# Patient Record
Sex: Male | Born: 1935 | Race: Black or African American | Hispanic: No | Smoking: Former smoker
Health system: Southern US, Community
[De-identification: ages and names within clinical notes are randomized; demographics above are authoritative.]

## PROBLEM LIST (undated history)

## (undated) DIAGNOSIS — N183 Chronic kidney disease, stage 3 unspecified: Secondary | ICD-10-CM

## (undated) DIAGNOSIS — E119 Type 2 diabetes mellitus without complications: Secondary | ICD-10-CM

## (undated) DIAGNOSIS — I5022 Chronic systolic (congestive) heart failure: Secondary | ICD-10-CM

## (undated) DIAGNOSIS — E7800 Pure hypercholesterolemia, unspecified: Secondary | ICD-10-CM

## (undated) DIAGNOSIS — I639 Cerebral infarction, unspecified: Secondary | ICD-10-CM

## (undated) DIAGNOSIS — I10 Essential (primary) hypertension: Secondary | ICD-10-CM

## (undated) DIAGNOSIS — R609 Edema, unspecified: Secondary | ICD-10-CM

## (undated) DIAGNOSIS — I219 Acute myocardial infarction, unspecified: Secondary | ICD-10-CM

## (undated) HISTORY — DX: Essential (primary) hypertension: I10

## (undated) HISTORY — DX: Edema, unspecified: R60.9

## (undated) HISTORY — DX: Acute myocardial infarction, unspecified: I21.9

## (undated) HISTORY — DX: Pure hypercholesterolemia, unspecified: E78.00

## (undated) HISTORY — PX: EYE SURGERY: SHX253

## (undated) HISTORY — DX: Type 2 diabetes mellitus without complications: E11.9

## (undated) HISTORY — PX: EXPLORATION POST OPERATIVE OPEN HEART: SHX5061

---

## 2006-04-17 ENCOUNTER — Ambulatory Visit: Payer: Self-pay | Admitting: Family Medicine

## 2006-04-22 ENCOUNTER — Inpatient Hospital Stay: Payer: Self-pay | Admitting: Vascular Surgery

## 2006-04-23 ENCOUNTER — Other Ambulatory Visit: Payer: Self-pay

## 2006-05-01 ENCOUNTER — Ambulatory Visit: Payer: Self-pay | Admitting: Vascular Surgery

## 2006-05-13 ENCOUNTER — Inpatient Hospital Stay: Payer: Self-pay | Admitting: Vascular Surgery

## 2007-10-20 ENCOUNTER — Ambulatory Visit: Payer: Self-pay | Admitting: Gastroenterology

## 2007-11-12 ENCOUNTER — Inpatient Hospital Stay: Payer: Self-pay | Admitting: Internal Medicine

## 2007-11-20 ENCOUNTER — Ambulatory Visit: Payer: Self-pay | Admitting: Gastroenterology

## 2007-11-26 ENCOUNTER — Ambulatory Visit: Payer: Self-pay | Admitting: Gastroenterology

## 2008-02-11 ENCOUNTER — Inpatient Hospital Stay: Payer: Self-pay | Admitting: Gastroenterology

## 2008-02-27 ENCOUNTER — Ambulatory Visit: Payer: Self-pay | Admitting: Gastroenterology

## 2008-04-20 ENCOUNTER — Ambulatory Visit: Payer: Self-pay | Admitting: Family Medicine

## 2008-04-29 ENCOUNTER — Ambulatory Visit: Payer: Self-pay | Admitting: Family Medicine

## 2008-05-29 ENCOUNTER — Ambulatory Visit: Payer: Self-pay | Admitting: Family Medicine

## 2008-06-29 ENCOUNTER — Ambulatory Visit: Payer: Self-pay | Admitting: Family Medicine

## 2008-10-20 ENCOUNTER — Ambulatory Visit: Payer: Self-pay | Admitting: Internal Medicine

## 2008-12-28 ENCOUNTER — Inpatient Hospital Stay: Payer: Self-pay | Admitting: Internal Medicine

## 2009-05-01 ENCOUNTER — Emergency Department: Payer: Self-pay | Admitting: Internal Medicine

## 2009-06-03 ENCOUNTER — Inpatient Hospital Stay: Payer: Self-pay | Admitting: Internal Medicine

## 2009-07-21 ENCOUNTER — Encounter: Payer: Self-pay | Admitting: Internal Medicine

## 2009-07-29 ENCOUNTER — Encounter: Payer: Self-pay | Admitting: Internal Medicine

## 2009-08-24 ENCOUNTER — Ambulatory Visit: Payer: Self-pay | Admitting: Internal Medicine

## 2009-08-29 ENCOUNTER — Encounter: Payer: Self-pay | Admitting: Internal Medicine

## 2009-09-29 ENCOUNTER — Encounter: Payer: Self-pay | Admitting: Internal Medicine

## 2009-11-05 ENCOUNTER — Inpatient Hospital Stay: Payer: Self-pay | Admitting: Internal Medicine

## 2009-12-05 ENCOUNTER — Ambulatory Visit: Payer: Self-pay | Admitting: Gastroenterology

## 2010-09-27 ENCOUNTER — Ambulatory Visit: Payer: Self-pay | Admitting: Internal Medicine

## 2011-01-30 ENCOUNTER — Ambulatory Visit: Payer: Self-pay | Admitting: Internal Medicine

## 2011-02-20 LAB — COMPREHENSIVE METABOLIC PANEL
Anion Gap: 6 — ABNORMAL LOW (ref 7–16)
Bilirubin,Total: 0.5 mg/dL (ref 0.2–1.0)
Chloride: 104 mmol/L (ref 98–107)
Co2: 31 mmol/L (ref 21–32)
Creatinine: 2.49 mg/dL — ABNORMAL HIGH (ref 0.60–1.30)
EGFR (African American): 33 — ABNORMAL LOW
EGFR (Non-African Amer.): 27 — ABNORMAL LOW
Potassium: 4.5 mmol/L (ref 3.5–5.1)
SGOT(AST): 23 U/L (ref 15–37)
SGPT (ALT): 25 U/L
Total Protein: 8.1 g/dL (ref 6.4–8.2)

## 2011-02-20 LAB — CBC
HCT: 33.6 % — ABNORMAL LOW (ref 40.0–52.0)
HGB: 10.6 g/dL — ABNORMAL LOW (ref 13.0–18.0)
MCHC: 31.6 g/dL — ABNORMAL LOW (ref 32.0–36.0)
MCV: 85 fL (ref 80–100)
RDW: 15.7 % — ABNORMAL HIGH (ref 11.5–14.5)
WBC: 11.6 10*3/uL — ABNORMAL HIGH (ref 3.8–10.6)

## 2011-02-20 LAB — CK TOTAL AND CKMB (NOT AT ARMC)
CK, Total: 134 U/L (ref 35–232)
CK-MB: 1.2 ng/mL (ref 0.5–3.6)

## 2011-02-20 LAB — TROPONIN I: Troponin-I: 0.04 ng/mL

## 2011-02-21 ENCOUNTER — Inpatient Hospital Stay: Payer: Self-pay | Admitting: Student

## 2011-02-21 LAB — CK TOTAL AND CKMB (NOT AT ARMC)
CK, Total: 113 U/L (ref 35–232)
CK-MB: 3.4 ng/mL (ref 0.5–3.6)

## 2011-02-21 LAB — BASIC METABOLIC PANEL
Anion Gap: 10 (ref 7–16)
BUN: 53 mg/dL — ABNORMAL HIGH (ref 7–18)
Calcium, Total: 8.8 mg/dL (ref 8.5–10.1)
Chloride: 103 mmol/L (ref 98–107)
Creatinine: 2.55 mg/dL — ABNORMAL HIGH (ref 0.60–1.30)
EGFR (African American): 32 — ABNORMAL LOW
EGFR (Non-African Amer.): 26 — ABNORMAL LOW
Glucose: 192 mg/dL — ABNORMAL HIGH (ref 65–99)
Potassium: 4.2 mmol/L (ref 3.5–5.1)

## 2011-02-21 LAB — FERRITIN: Ferritin (ARMC): 69 ng/mL

## 2011-02-21 LAB — TROPONIN I
Troponin-I: 0.14 ng/mL — ABNORMAL HIGH
Troponin-I: 0.41 ng/mL — ABNORMAL HIGH

## 2011-02-21 LAB — IRON AND TIBC
Iron Bind.Cap.(Total): 268 ug/dL
Iron Saturation: 10 %
Iron: 26 ug/dL — ABNORMAL LOW
Unbound Iron-Bind.Cap.: 242 ug/dL

## 2011-02-21 LAB — URINALYSIS, COMPLETE
Bilirubin,UR: NEGATIVE
Blood: NEGATIVE
Hyaline Cast: 17
Ketone: NEGATIVE
Leukocyte Esterase: NEGATIVE
Ph: 5 (ref 4.5–8.0)
Protein: NEGATIVE
RBC,UR: NONE SEEN /HPF (ref 0–5)
Specific Gravity: 1.006 (ref 1.003–1.030)
Squamous Epithelial: NONE SEEN

## 2011-02-21 LAB — PHOSPHORUS: Phosphorus: 3.9 mg/dL

## 2011-02-21 LAB — RAPID INFLUENZA A&B ANTIGENS

## 2011-02-22 LAB — BASIC METABOLIC PANEL
Calcium, Total: 8.6 mg/dL (ref 8.5–10.1)
Co2: 28 mmol/L (ref 21–32)
EGFR (African American): 33 — ABNORMAL LOW
EGFR (Non-African Amer.): 28 — ABNORMAL LOW
Glucose: 192 mg/dL — ABNORMAL HIGH (ref 65–99)
Osmolality: 304 (ref 275–301)
Potassium: 3.9 mmol/L (ref 3.5–5.1)
Sodium: 143 mmol/L (ref 136–145)

## 2011-02-22 LAB — TROPONIN I
Troponin-I: 0.68 ng/mL — ABNORMAL HIGH
Troponin-I: 0.83 ng/mL — ABNORMAL HIGH

## 2011-02-22 LAB — CBC WITH DIFFERENTIAL/PLATELET
Eosinophil %: 0 %
HCT: 28.2 % — ABNORMAL LOW (ref 40.0–52.0)
Lymphocyte #: 0.8 10*3/uL — ABNORMAL LOW (ref 1.0–3.6)
Lymphocyte %: 5.2 %
MCV: 84 fL (ref 80–100)
Monocyte %: 9 %
Neutrophil #: 12.5 10*3/uL — ABNORMAL HIGH (ref 1.4–6.5)
Neutrophil %: 85.6 %
Platelet: 127 10*3/uL — ABNORMAL LOW (ref 150–440)
RDW: 15.3 % — ABNORMAL HIGH (ref 11.5–14.5)
WBC: 14.6 10*3/uL — ABNORMAL HIGH (ref 3.8–10.6)

## 2011-02-22 LAB — PROTEIN / CREATININE RATIO, URINE
Creatinine, Urine: 49.3 mg/dL (ref 30.0–125.0)
Protein, Random Urine: 11 mg/dL (ref 0–12)
Protein/Creat. Ratio: 223 mg/gCREAT — ABNORMAL HIGH (ref 0–200)

## 2011-02-22 LAB — PROTIME-INR
INR: 1.3
Prothrombin Time: 16.6 secs — ABNORMAL HIGH (ref 11.5–14.7)

## 2011-02-22 LAB — T4, FREE: Free Thyroxine: 1.48 ng/dL — ABNORMAL HIGH (ref 0.76–1.46)

## 2011-02-22 LAB — MAGNESIUM: Magnesium: 1.7 mg/dL — ABNORMAL LOW

## 2011-02-22 LAB — LIPID PANEL
Cholesterol: 86 mg/dL (ref 0–200)
Ldl Cholesterol, Calc: 26 mg/dL (ref 0–100)
Triglycerides: 75 mg/dL (ref 0–200)

## 2011-02-22 LAB — PROTEIN ELECTROPHORESIS(ARMC)

## 2011-02-23 LAB — TROPONIN I
Troponin-I: 0.73 ng/mL — ABNORMAL HIGH
Troponin-I: 0.67 ng/mL — ABNORMAL HIGH

## 2011-02-23 LAB — CBC WITH DIFFERENTIAL/PLATELET
Basophil #: 0 10*3/uL (ref 0.0–0.1)
Basophil %: 0.4 %
Eosinophil #: 0.1 10*3/uL (ref 0.0–0.7)
Eosinophil %: 0.9 %
HCT: 27.5 % — ABNORMAL LOW (ref 40.0–52.0)
HGB: 8.9 g/dL — ABNORMAL LOW (ref 13.0–18.0)
Lymphocyte #: 0.8 10*3/uL — ABNORMAL LOW (ref 1.0–3.6)
Lymphocyte %: 7.1 %
MCH: 27.4 pg (ref 26.0–34.0)
MCHC: 32.5 g/dL (ref 32.0–36.0)
Monocyte #: 1.2 10*3/uL — ABNORMAL HIGH (ref 0.0–0.7)
Neutrophil #: 9.7 10*3/uL — ABNORMAL HIGH (ref 1.4–6.5)
Neutrophil %: 81.8 %
RBC: 3.26 10*6/uL — ABNORMAL LOW (ref 4.40–5.90)
WBC: 11.8 10*3/uL — ABNORMAL HIGH (ref 3.8–10.6)

## 2011-02-23 LAB — BASIC METABOLIC PANEL
Anion Gap: 12 (ref 7–16)
Chloride: 102 mmol/L (ref 98–107)
Co2: 30 mmol/L (ref 21–32)
Creatinine: 2.13 mg/dL — ABNORMAL HIGH (ref 0.60–1.30)
Potassium: 3.9 mmol/L (ref 3.5–5.1)
Sodium: 144 mmol/L (ref 136–145)

## 2011-02-24 LAB — CBC WITH DIFFERENTIAL/PLATELET
Basophil #: 0 10*3/uL (ref 0.0–0.1)
Basophil %: 0.1 %
Eosinophil #: 0.1 10*3/uL (ref 0.0–0.7)
HCT: 26.1 % — ABNORMAL LOW (ref 40.0–52.0)
Lymphocyte %: 6.2 %
MCV: 84 fL (ref 80–100)
Monocyte %: 11.6 %
Neutrophil #: 8.2 10*3/uL — ABNORMAL HIGH (ref 1.4–6.5)
Neutrophil %: 81.5 %
Platelet: 121 10*3/uL — ABNORMAL LOW (ref 150–440)
RBC: 3.11 10*6/uL — ABNORMAL LOW (ref 4.40–5.90)
RDW: 15.3 % — ABNORMAL HIGH (ref 11.5–14.5)
WBC: 10.1 10*3/uL (ref 3.8–10.6)

## 2011-02-24 LAB — BASIC METABOLIC PANEL
Anion Gap: 10 (ref 7–16)
Chloride: 103 mmol/L (ref 98–107)
Creatinine: 2.11 mg/dL — ABNORMAL HIGH (ref 0.60–1.30)
EGFR (African American): 39 — ABNORMAL LOW
Osmolality: 307 (ref 275–301)
Potassium: 4.1 mmol/L (ref 3.5–5.1)
Sodium: 144 mmol/L (ref 136–145)

## 2011-03-02 ENCOUNTER — Ambulatory Visit: Payer: Self-pay | Admitting: Internal Medicine

## 2011-08-15 ENCOUNTER — Ambulatory Visit: Payer: Self-pay | Admitting: Ophthalmology

## 2011-11-14 ENCOUNTER — Ambulatory Visit: Payer: Self-pay | Admitting: Ophthalmology

## 2012-06-27 ENCOUNTER — Inpatient Hospital Stay: Payer: Self-pay | Admitting: Internal Medicine

## 2012-06-27 LAB — URINALYSIS, COMPLETE
Bacteria: NONE SEEN
Bilirubin,UR: NEGATIVE
Glucose,UR: 50 mg/dL (ref 0–75)
Leukocyte Esterase: NEGATIVE
Nitrite: NEGATIVE
Ph: 6 (ref 4.5–8.0)
Protein: NEGATIVE

## 2012-06-27 LAB — COMPREHENSIVE METABOLIC PANEL
Albumin: 3.5 g/dL (ref 3.4–5.0)
Anion Gap: 9 (ref 7–16)
BUN: 21 mg/dL — ABNORMAL HIGH (ref 7–18)
Co2: 24 mmol/L (ref 21–32)
Creatinine: 1.37 mg/dL — ABNORMAL HIGH (ref 0.60–1.30)
EGFR (African American): 57 — ABNORMAL LOW
EGFR (Non-African Amer.): 49 — ABNORMAL LOW
Glucose: 129 mg/dL — ABNORMAL HIGH (ref 65–99)
Potassium: 4.1 mmol/L (ref 3.5–5.1)
SGPT (ALT): 23 U/L (ref 12–78)
Total Protein: 8.1 g/dL (ref 6.4–8.2)

## 2012-06-27 LAB — CBC
HCT: 34.2 % — ABNORMAL LOW (ref 40.0–52.0)
HGB: 11 g/dL — ABNORMAL LOW (ref 13.0–18.0)
MCH: 26.6 pg (ref 26.0–34.0)
MCHC: 32.2 g/dL (ref 32.0–36.0)
MCV: 83 fL (ref 80–100)
Platelet: 103 10*3/uL — ABNORMAL LOW (ref 150–440)
RBC: 4.15 10*6/uL — ABNORMAL LOW (ref 4.40–5.90)
RDW: 14.3 % (ref 11.5–14.5)
WBC: 11.5 10*3/uL — ABNORMAL HIGH (ref 3.8–10.6)

## 2012-06-28 LAB — BASIC METABOLIC PANEL
Anion Gap: 7 (ref 7–16)
Chloride: 108 mmol/L — ABNORMAL HIGH (ref 98–107)
Co2: 26 mmol/L (ref 21–32)
Creatinine: 1.76 mg/dL — ABNORMAL HIGH (ref 0.60–1.30)
EGFR (Non-African Amer.): 36 — ABNORMAL LOW
Osmolality: 288 (ref 275–301)
Potassium: 4.2 mmol/L (ref 3.5–5.1)
Sodium: 141 mmol/L (ref 136–145)

## 2012-06-28 LAB — CBC WITH DIFFERENTIAL/PLATELET
Basophil #: 0.1 10*3/uL (ref 0.0–0.1)
Basophil %: 0.4 %
Eosinophil #: 0.1 10*3/uL (ref 0.0–0.7)
HCT: 28.8 % — ABNORMAL LOW (ref 40.0–52.0)
HGB: 9.2 g/dL — ABNORMAL LOW (ref 13.0–18.0)
Lymphocyte #: 0.8 10*3/uL — ABNORMAL LOW (ref 1.0–3.6)
Lymphocyte %: 6.3 %
MCH: 26.1 pg (ref 26.0–34.0)
MCHC: 32.1 g/dL (ref 32.0–36.0)
Monocyte #: 1 x10 3/mm (ref 0.2–1.0)
Monocyte %: 7.8 %
Neutrophil #: 11.3 10*3/uL — ABNORMAL HIGH (ref 1.4–6.5)
RDW: 14.8 % — ABNORMAL HIGH (ref 11.5–14.5)

## 2012-06-28 LAB — TROPONIN I
Troponin-I: 4.8 ng/mL — ABNORMAL HIGH
Troponin-I: 3.08 ng/mL — ABNORMAL HIGH

## 2012-06-29 LAB — CBC WITH DIFFERENTIAL/PLATELET
Basophil %: 0.3 %
Eosinophil #: 0.3 10*3/uL (ref 0.0–0.7)
HCT: 29.1 % — ABNORMAL LOW (ref 40.0–52.0)
Lymphocyte #: 1.3 10*3/uL (ref 1.0–3.6)
Lymphocyte %: 10 %
MCH: 27.1 pg (ref 26.0–34.0)
MCV: 82 fL (ref 80–100)
Monocyte #: 1.2 x10 3/mm — ABNORMAL HIGH (ref 0.2–1.0)
Neutrophil #: 10.2 10*3/uL — ABNORMAL HIGH (ref 1.4–6.5)
Neutrophil %: 78.5 %
Platelet: 91 10*3/uL — ABNORMAL LOW (ref 150–440)
RBC: 3.55 10*6/uL — ABNORMAL LOW (ref 4.40–5.90)
WBC: 13 10*3/uL — ABNORMAL HIGH (ref 3.8–10.6)

## 2012-06-29 LAB — BASIC METABOLIC PANEL
Anion Gap: 5 — ABNORMAL LOW (ref 7–16)
BUN: 19 mg/dL — ABNORMAL HIGH (ref 7–18)
Chloride: 111 mmol/L — ABNORMAL HIGH (ref 98–107)
Co2: 24 mmol/L (ref 21–32)
Creatinine: 1.68 mg/dL — ABNORMAL HIGH (ref 0.60–1.30)
Glucose: 150 mg/dL — ABNORMAL HIGH (ref 65–99)
Potassium: 4.2 mmol/L (ref 3.5–5.1)
Sodium: 140 mmol/L (ref 136–145)

## 2012-06-30 LAB — CBC WITH DIFFERENTIAL/PLATELET
Basophil %: 0.7 %
Eosinophil #: 0.1 10*3/uL (ref 0.0–0.7)
Eosinophil %: 0.7 %
HCT: 26.5 % — ABNORMAL LOW (ref 40.0–52.0)
HGB: 8.4 g/dL — ABNORMAL LOW (ref 13.0–18.0)
Lymphocyte %: 9.2 %
MCH: 25.9 pg — ABNORMAL LOW (ref 26.0–34.0)
Platelet: 93 10*3/uL — ABNORMAL LOW (ref 150–440)
RDW: 14.4 % (ref 11.5–14.5)

## 2012-06-30 LAB — BASIC METABOLIC PANEL
Anion Gap: 4 — ABNORMAL LOW (ref 7–16)
Chloride: 108 mmol/L — ABNORMAL HIGH (ref 98–107)
Co2: 27 mmol/L (ref 21–32)
Creatinine: 1.64 mg/dL — ABNORMAL HIGH (ref 0.60–1.30)
EGFR (African American): 46 — ABNORMAL LOW
Glucose: 186 mg/dL — ABNORMAL HIGH (ref 65–99)
Potassium: 4.2 mmol/L (ref 3.5–5.1)

## 2012-06-30 LAB — HEMOGLOBIN: HGB: 8.9 g/dL — ABNORMAL LOW (ref 13.0–18.0)

## 2012-07-01 LAB — CBC WITH DIFFERENTIAL/PLATELET
Basophil #: 0.1 10*3/uL (ref 0.0–0.1)
Basophil %: 0.5 %
Eosinophil #: 0.4 10*3/uL (ref 0.0–0.7)
Eosinophil %: 3.9 %
HCT: 30.3 % — ABNORMAL LOW (ref 40.0–52.0)
HGB: 9.8 g/dL — ABNORMAL LOW (ref 13.0–18.0)
Lymphocyte #: 1.9 10*3/uL (ref 1.0–3.6)
MCH: 26.6 pg (ref 26.0–34.0)
MCHC: 32.2 g/dL (ref 32.0–36.0)
MCV: 82 fL (ref 80–100)
Neutrophil %: 67.2 %
RBC: 3.67 10*6/uL — ABNORMAL LOW (ref 4.40–5.90)
RDW: 14.5 % (ref 11.5–14.5)

## 2012-07-01 LAB — BASIC METABOLIC PANEL
Anion Gap: 6 — ABNORMAL LOW (ref 7–16)
Calcium, Total: 8.7 mg/dL (ref 8.5–10.1)
Chloride: 110 mmol/L — ABNORMAL HIGH (ref 98–107)
Creatinine: 1.53 mg/dL — ABNORMAL HIGH (ref 0.60–1.30)
EGFR (African American): 50 — ABNORMAL LOW
Glucose: 138 mg/dL — ABNORMAL HIGH (ref 65–99)
Potassium: 4.1 mmol/L (ref 3.5–5.1)

## 2012-07-02 LAB — CBC WITH DIFFERENTIAL/PLATELET
Basophil #: 0.1 10*3/uL (ref 0.0–0.1)
Basophil %: 0.6 %
Eosinophil #: 0.4 10*3/uL (ref 0.0–0.7)
HCT: 26.7 % — ABNORMAL LOW (ref 40.0–52.0)
Lymphocyte #: 1 10*3/uL (ref 1.0–3.6)
Lymphocyte %: 11.6 %
MCH: 26.7 pg (ref 26.0–34.0)
MCV: 82 fL (ref 80–100)
Neutrophil #: 6.2 10*3/uL (ref 1.4–6.5)
Neutrophil %: 71.2 %
Platelet: 118 10*3/uL — ABNORMAL LOW (ref 150–440)
RBC: 3.26 10*6/uL — ABNORMAL LOW (ref 4.40–5.90)
RDW: 14.5 % (ref 11.5–14.5)
WBC: 8.7 10*3/uL (ref 3.8–10.6)

## 2012-07-03 LAB — EXPECTORATED SPUTUM ASSESSMENT W GRAM STAIN, RFLX TO RESP C

## 2012-07-11 ENCOUNTER — Emergency Department: Payer: Self-pay

## 2012-07-11 LAB — CBC WITH DIFFERENTIAL/PLATELET
Basophil #: 0 10*3/uL (ref 0.0–0.1)
Eosinophil #: 0.1 10*3/uL (ref 0.0–0.7)
HCT: 29.6 % — ABNORMAL LOW (ref 40.0–52.0)
HGB: 9.3 g/dL — ABNORMAL LOW (ref 13.0–18.0)
Lymphocyte #: 0.6 10*3/uL — ABNORMAL LOW (ref 1.0–3.6)
Lymphocyte %: 3.8 %
MCHC: 31.6 g/dL — ABNORMAL LOW (ref 32.0–36.0)
Neutrophil %: 89.4 %
Platelet: 201 10*3/uL (ref 150–440)
RBC: 3.6 10*6/uL — ABNORMAL LOW (ref 4.40–5.90)
WBC: 16.5 10*3/uL — ABNORMAL HIGH (ref 3.8–10.6)

## 2012-07-11 LAB — URINALYSIS, COMPLETE
Bacteria: NONE SEEN
Bilirubin,UR: NEGATIVE
Blood: NEGATIVE
Glucose,UR: NEGATIVE mg/dL (ref 0–75)
Hyaline Cast: 27
Ketone: NEGATIVE
Nitrite: NEGATIVE
Ph: 5 (ref 4.5–8.0)
Protein: NEGATIVE
RBC,UR: <1 /HPF (ref 0–5)
Specific Gravity: 1.013 (ref 1.003–1.030)
Squamous Epithelial: NONE SEEN

## 2012-07-11 LAB — BASIC METABOLIC PANEL
Anion Gap: 8 (ref 7–16)
BUN: 32 mg/dL — ABNORMAL HIGH (ref 7–18)
Calcium, Total: 8.7 mg/dL (ref 8.5–10.1)
Chloride: 107 mmol/L (ref 98–107)
Creatinine: 2.02 mg/dL — ABNORMAL HIGH (ref 0.60–1.30)
EGFR (African American): 36 — ABNORMAL LOW
EGFR (Non-African Amer.): 31 — ABNORMAL LOW
Glucose: 154 mg/dL — ABNORMAL HIGH (ref 65–99)
Osmolality: 293 (ref 275–301)
Potassium: 4.1 mmol/L (ref 3.5–5.1)

## 2012-10-10 ENCOUNTER — Ambulatory Visit: Payer: Self-pay | Admitting: Gastroenterology

## 2012-10-10 LAB — CBC WITH DIFFERENTIAL/PLATELET
Basophil #: 0.1 10*3/uL (ref 0.0–0.1)
Basophil %: 1.3 %
Eosinophil #: 0.3 10*3/uL (ref 0.0–0.7)
Eosinophil %: 3.7 %
HGB: 11.6 g/dL — ABNORMAL LOW (ref 13.0–18.0)
Lymphocyte #: 1.5 10*3/uL (ref 1.0–3.6)
MCHC: 32.6 g/dL (ref 32.0–36.0)
Neutrophil #: 6.2 10*3/uL (ref 1.4–6.5)
Neutrophil %: 68.9 %
RBC: 4.5 10*6/uL (ref 4.40–5.90)
WBC: 9.1 10*3/uL (ref 3.8–10.6)

## 2012-11-27 ENCOUNTER — Encounter: Payer: Self-pay | Admitting: Podiatrist

## 2012-11-27 ENCOUNTER — Ambulatory Visit (INDEPENDENT_AMBULATORY_CARE_PROVIDER_SITE_OTHER): Payer: No Typology Code available for payment source | Admitting: Podiatrist

## 2012-11-27 VITALS — BP 137/78 | HR 65 | Resp 16 | Ht 67.0 in | Wt 210.0 lb

## 2012-11-27 DIAGNOSIS — M79609 Pain in unspecified limb: Secondary | ICD-10-CM

## 2012-11-27 DIAGNOSIS — B351 Tinea unguium: Secondary | ICD-10-CM

## 2012-11-27 NOTE — Progress Notes (Signed)
HPI:  Patient presents today for follow up of foot and nail care. Denies any new complaints today.  Objective:  Patients chart is reviewed.  Neurovascular status unchanged.  Patients nails are thickened, discolored, distrophic, friable and brittle with yellow-brown discoloration. Patient subjectively relates they are painful with shoes and with ambulation.both feet  Assessment:  Symptomatic onychomycosis  Plan:  Discussed treatment options and alternatives.  The symptomatic toenails were debrided through manual an mechanical means without complication.  Return appointment recommended at routine intervals of 3 months

## 2012-11-27 NOTE — Patient Instructions (Signed)

## 2013-02-26 ENCOUNTER — Ambulatory Visit: Payer: No Typology Code available for payment source | Admitting: Podiatrist

## 2013-03-05 ENCOUNTER — Encounter: Payer: Self-pay | Admitting: Podiatrist

## 2013-03-05 ENCOUNTER — Ambulatory Visit (INDEPENDENT_AMBULATORY_CARE_PROVIDER_SITE_OTHER): Payer: Medicare Other | Admitting: Podiatrist

## 2013-03-05 VITALS — BP 166/78 | HR 67 | Resp 16 | Ht 67.0 in | Wt 210.0 lb

## 2013-03-05 DIAGNOSIS — M79609 Pain in unspecified limb: Secondary | ICD-10-CM

## 2013-03-05 DIAGNOSIS — B351 Tinea unguium: Secondary | ICD-10-CM

## 2013-03-05 DIAGNOSIS — E119 Type 2 diabetes mellitus without complications: Secondary | ICD-10-CM

## 2013-03-05 NOTE — Patient Instructions (Signed)
Diabetes and Foot Care Diabetes may cause you to have problems because of poor blood supply (circulation) to your feet and legs. This may cause the skin on your feet to become thinner, break easier, and heal more slowly. Your skin may become dry, and the skin may peel and crack. You may also have nerve damage in your legs and feet causing decreased feeling in them. You may not notice minor injuries to your feet that could lead to infections or more serious problems. Taking care of your feet is one of the most important things you can do for yourself.  HOME CARE INSTRUCTIONS  Wear shoes at all times, even in the house. Do not go barefoot. Bare feet are easily injured.  Check your feet daily for blisters, cuts, and redness. If you cannot see the bottom of your feet, use a mirror or ask someone for help.  Wash your feet with warm water (do not use hot water) and mild soap. Then pat your feet and the areas between your toes until they are completely dry. Do not soak your feet as this can dry your skin.  Apply a moisturizing lotion or petroleum jelly (that does not contain alcohol and is unscented) to the skin on your feet and to dry, brittle toenails. Do not apply lotion between your toes.  Trim your toenails straight across. Do not dig under them or around the cuticle. File the edges of your nails with an emery board or nail file.  Do not cut corns or calluses or try to remove them with medicine.  Wear clean socks or stockings every day. Make sure they are not too tight. Do not wear knee-high stockings since they may decrease blood flow to your legs.  Wear shoes that fit properly and have enough cushioning. To break in new shoes, wear them for just a few hours a day. This prevents you from injuring your feet. Always look in your shoes before you put them on to be sure there are no objects inside.  Do not cross your legs. This may decrease the blood flow to your feet.  If you find a minor scrape,  cut, or break in the skin on your feet, keep it and the skin around it clean and dry. These areas may be cleansed with mild soap and water. Do not cleanse the area with peroxide, alcohol, or iodine.  When you remove an adhesive bandage, be sure not to damage the skin around it.  If you have a wound, look at it several times a day to make sure it is healing.  Do not use heating pads or hot water bottles. They may burn your skin. If you have lost feeling in your feet or legs, you may not know it is happening until it is too late.  Make sure your health care provider performs a complete foot exam at least annually or more often if you have foot problems. Report any cuts, sores, or bruises to your health care provider immediately. SEEK MEDICAL CARE IF:   You have an injury that is not healing.  You have cuts or breaks in the skin.  You have an ingrown nail.  You notice redness on your legs or feet.  You feel burning or tingling in your legs or feet.  You have pain or cramps in your legs and feet.  Your legs or feet are numb.  Your feet always feel cold. SEEK IMMEDIATE MEDICAL CARE IF:   There is increasing redness,   swelling, or pain in or around a wound.  There is a red line that goes up your leg.  Pus is coming from a wound.  You develop a fever or as directed by your health care provider.  You notice a bad smell coming from an ulcer or wound. Document Released: 01/13/2000 Document Revised: 09/17/2012 Document Reviewed: 06/24/2012 ExitCare Patient Information 2014 [employer].  

## 2013-03-05 NOTE — Progress Notes (Signed)
HPI: Patient presents today for follow up of diabetic foot and nail care. Past medical history, meds, and allergies reviewed.   Objective:  Neurovascular status unchanged with palpable pedal pulses at 2/4 DP and 0/4 PT bilateral. Ankle swelling is noted bilateral which is per baseline. and neurological sensation intact epicriticaly and protectively. Patients nails are elongated, thickened, discolored, dystrophic with ingrown deformity present.     Assessment: Diabetes with Angiopathy , Ingrown nail deformity,  Plan: Discussed treatment options and alternatives. Debrided nails without complication.   Return appointment recommended at routine intervals of 3 months.   Trudie Buckler, DPM

## 2013-06-04 ENCOUNTER — Ambulatory Visit (INDEPENDENT_AMBULATORY_CARE_PROVIDER_SITE_OTHER): Payer: Medicare Other | Admitting: Podiatrist

## 2013-06-04 ENCOUNTER — Encounter: Payer: Self-pay | Admitting: Podiatrist

## 2013-06-04 VITALS — BP 134/86 | HR 88 | Resp 18

## 2013-06-04 DIAGNOSIS — M79609 Pain in unspecified limb: Secondary | ICD-10-CM

## 2013-06-04 DIAGNOSIS — B351 Tinea unguium: Secondary | ICD-10-CM

## 2013-06-04 DIAGNOSIS — E119 Type 2 diabetes mellitus without complications: Secondary | ICD-10-CM

## 2013-06-04 NOTE — Progress Notes (Signed)
HPI: Patient presents today for follow up of diabetic foot and nail care. Past medical history, meds, and allergies reviewed.  Objective: Neurovascular status unchanged with palpable pedal pulses at 2/4 DP and 0/4 PT bilateral. Ankle swelling is noted bilateral which is per baseline. and neurological sensation intact epicriticaly and protectively. Patients nails are elongated, thickened, discolored, dystrophic with ingrown deformity present.  Assessment: Diabetes with Angiopathy , Ingrown nail deformity,  Plan: Discussed treatment options and alternatives. Debrided nails without complication. Return appointment recommended at routine intervals of 3 months. His wife was taken to the hospital today for blood pressure problems from the memory care unit at [facility].  Ask how she is doing next visit.

## 2013-06-04 NOTE — Patient Instructions (Signed)
Diabetes and Foot Care Diabetes may cause you to have problems because of poor blood supply (circulation) to your feet and legs. This may cause the skin on your feet to become thinner, break easier, and heal more slowly. Your skin may become dry, and the skin may peel and crack. You may also have nerve damage in your legs and feet causing decreased feeling in them. You may not notice minor injuries to your feet that could lead to infections or more serious problems. Taking care of your feet is one of the most important things you can do for yourself.  HOME CARE INSTRUCTIONS  Wear shoes at all times, even in the house. Do not go barefoot. Bare feet are easily injured.  Check your feet daily for blisters, cuts, and redness. If you cannot see the bottom of your feet, use a mirror or ask someone for help.  Wash your feet with warm water (do not use hot water) and mild soap. Then pat your feet and the areas between your toes until they are completely dry. Do not soak your feet as this can dry your skin.  Apply a moisturizing lotion or petroleum jelly (that does not contain alcohol and is unscented) to the skin on your feet and to dry, brittle toenails. Do not apply lotion between your toes.  Trim your toenails straight across. Do not dig under them or around the cuticle. File the edges of your nails with an emery board or nail file.  Do not cut corns or calluses or try to remove them with medicine.  Wear clean socks or stockings every day. Make sure they are not too tight. Do not wear knee-high stockings since they may decrease blood flow to your legs.  Wear shoes that fit properly and have enough cushioning. To break in new shoes, wear them for just a few hours a day. This prevents you from injuring your feet. Always look in your shoes before you put them on to be sure there are no objects inside.  Do not cross your legs. This may decrease the blood flow to your feet.  If you find a minor scrape,  cut, or break in the skin on your feet, keep it and the skin around it clean and dry. These areas may be cleansed with mild soap and water. Do not cleanse the area with peroxide, alcohol, or iodine.  When you remove an adhesive bandage, be sure not to damage the skin around it.  If you have a wound, look at it several times a day to make sure it is healing.  Do not use heating pads or hot water bottles. They may burn your skin. If you have lost feeling in your feet or legs, you may not know it is happening until it is too late.  Make sure your health care provider performs a complete foot exam at least annually or more often if you have foot problems. Report any cuts, sores, or bruises to your health care provider immediately. SEEK MEDICAL CARE IF:   You have an injury that is not healing.  You have cuts or breaks in the skin.  You have an ingrown nail.  You notice redness on your legs or feet.  You feel burning or tingling in your legs or feet.  You have pain or cramps in your legs and feet.  Your legs or feet are numb.  Your feet always feel cold. SEEK IMMEDIATE MEDICAL CARE IF:   There is increasing redness,   swelling, or pain in or around a wound.  There is a red line that goes up your leg.  Pus is coming from a wound.  You develop a fever or as directed by your health care provider.  You notice a bad smell coming from an ulcer or wound. Document Released: 01/13/2000 Document Revised: 09/17/2012 Document Reviewed: 06/24/2012 ExitCare Patient Information 2014 [employer].  

## 2013-09-03 ENCOUNTER — Ambulatory Visit (INDEPENDENT_AMBULATORY_CARE_PROVIDER_SITE_OTHER): Payer: Medicare Other | Admitting: Podiatrist

## 2013-09-03 DIAGNOSIS — M79609 Pain in unspecified limb: Secondary | ICD-10-CM

## 2013-09-03 DIAGNOSIS — M79673 Pain in unspecified foot: Secondary | ICD-10-CM

## 2013-09-03 DIAGNOSIS — B351 Tinea unguium: Secondary | ICD-10-CM

## 2013-09-03 NOTE — Progress Notes (Signed)
HPI: Patient presents today for follow up of diabetic foot and nail care. Past medical history, meds, and allergies reviewed and unchanged.   Objective: Neurovascular status unchanged with palpable pedal pulses at 2/4 DP and 0/4 PT bilateral. Ankle swelling is noted bilateral which is per baseline. and neurological sensation intact epicriticaly and protectively. Patients nails are elongated, thickened, discolored, dystrophic with ingrown deformity present.   Assessment: Diabetes with Angiopathy , Ingrown nail deformity,   Plan: Discussed treatment options and alternatives. Debrided nails without complication. Return appointment recommended at routine intervals of 3 months. He will be seeing Dr. Jacqualyn Posey at the next visit and was introduced to him at today's visit.

## 2013-09-08 DIAGNOSIS — R809 Proteinuria, unspecified: Secondary | ICD-10-CM | POA: Insufficient documentation

## 2013-09-08 DIAGNOSIS — E119 Type 2 diabetes mellitus without complications: Secondary | ICD-10-CM | POA: Insufficient documentation

## 2013-09-09 DIAGNOSIS — I2510 Atherosclerotic heart disease of native coronary artery without angina pectoris: Secondary | ICD-10-CM | POA: Diagnosis present

## 2013-12-03 ENCOUNTER — Ambulatory Visit (INDEPENDENT_AMBULATORY_CARE_PROVIDER_SITE_OTHER): Payer: Medicare Other | Admitting: Podiatry

## 2013-12-03 DIAGNOSIS — B351 Tinea unguium: Secondary | ICD-10-CM

## 2013-12-03 DIAGNOSIS — M79673 Pain in unspecified foot: Secondary | ICD-10-CM

## 2013-12-03 DIAGNOSIS — L84 Corns and callosities: Secondary | ICD-10-CM

## 2013-12-03 NOTE — Patient Instructions (Signed)
Diabetes and Foot Care Diabetes may cause you to have problems because of poor blood supply (circulation) to your feet and legs. This may cause the skin on your feet to become thinner, break easier, and heal more slowly. Your skin may become dry, and the skin may peel and crack. You may also have nerve damage in your legs and feet causing decreased feeling in them. You may not notice minor injuries to your feet that could lead to infections or more serious problems. Taking care of your feet is one of the most important things you can do for yourself.  HOME CARE INSTRUCTIONS  Wear shoes at all times, even in the house. Do not go barefoot. Bare feet are easily injured.  Check your feet daily for blisters, cuts, and redness. If you cannot see the bottom of your feet, use a mirror or ask someone for help.  Wash your feet with warm water (do not use hot water) and mild soap. Then pat your feet and the areas between your toes until they are completely dry. Do not soak your feet as this can dry your skin.  Apply a moisturizing lotion or petroleum jelly (that does not contain alcohol and is unscented) to the skin on your feet and to dry, brittle toenails. Do not apply lotion between your toes.  Trim your toenails straight across. Do not dig under them or around the cuticle. File the edges of your nails with an emery board or nail file.  Do not cut corns or calluses or try to remove them with medicine.  Wear clean socks or stockings every day. Make sure they are not too tight. Do not wear knee-high stockings since they may decrease blood flow to your legs.  Wear shoes that fit properly and have enough cushioning. To break in new shoes, wear them for just a few hours a day. This prevents you from injuring your feet. Always look in your shoes before you put them on to be sure there are no objects inside.  Do not cross your legs. This may decrease the blood flow to your feet.  If you find a minor scrape,  cut, or break in the skin on your feet, keep it and the skin around it clean and dry. These areas may be cleansed with mild soap and water. Do not cleanse the area with peroxide, alcohol, or iodine.  When you remove an adhesive bandage, be sure not to damage the skin around it.  If you have a wound, look at it several times a day to make sure it is healing.  Do not use heating pads or hot water bottles. They may burn your skin. If you have lost feeling in your feet or legs, you may not know it is happening until it is too late.  Make sure your health care provider performs a complete foot exam at least annually or more often if you have foot problems. Report any cuts, sores, or bruises to your health care provider immediately. SEEK MEDICAL CARE IF:   You have an injury that is not healing.  You have cuts or breaks in the skin.  You have an ingrown nail.  You notice redness on your legs or feet.  You feel burning or tingling in your legs or feet.  You have pain or cramps in your legs and feet.  Your legs or feet are numb.  Your feet always feel cold. SEEK IMMEDIATE MEDICAL CARE IF:   There is increasing redness,   swelling, or pain in or around a wound.  There is a red line that goes up your leg.  Pus is coming from a wound.  You develop a fever or as directed by your health care provider.  You notice a bad smell coming from an ulcer or wound. Document Released: 01/13/2000 Document Revised: 09/17/2012 Document Reviewed: 06/24/2012 ExitCare Patient Information 2015 [employer]. This information is not intended to replace advice given to you by your health care provider. Make sure you discuss any questions you have with your health care provider.  

## 2013-12-03 NOTE — Progress Notes (Signed)
Patient ID: Ryan Mcclure, male   DOB: [birth year], 80 y.o.   MRN: [id number]  Subjective: Patient returns to the office today for diabetic risk assessment and for painful elongated nails. His blood sugar runs between 85-120. Denies any acute changes since last appointment. No other complaints at this time. Denies any systemic complaints as fevers, chills, nausea, vomiting.  Objective: AAO 3, NAD DP pulses palpable bilaterally, PT pulses nonpalpable which is unchanged. CRT less than 3 seconds. Protective sensation intact with Derrel Nip monofilament Nails hypertrophic, dystrophic, elongated, brittle, discolored 10. Mild incurvation of the nails.No surrounding erythema or drainage. On the lateral aspect the left fifth digit there is an adjacent hyperkeratotic lesion abutting the nail. Mild flexion contracture of the digits.  There is a small area on the dorsal aspect of the left fourth digit of what appears to be a small dried hemorrhagic bulla. There is no fluid identified and there is no surrounding erythema, ascending cellulitis, pain with the area.  No open lesions. No calf pain with compression, swelling, warmth, erythema  Assessment: 80 year old male was sent to medical onychomycosis.  Plan: -Treatment options discussed including alternatives, risks, complications -Nail sharply debrided 10 without complications. -Hyperkeratotic lesion lateral aspect left fifth digit sharply debrided without complications. -Discussed with the patient to monitor for the area on the dorsal aspect of the left fourth digit. Patient is unaware that this area was present. Monitor for any changes and call the office if there are. -Follow-up in 3 months or sooner if any problems are to arise or any change in symptoms. In the meantime call the office with any questions, concerns.

## 2014-03-04 ENCOUNTER — Ambulatory Visit (INDEPENDENT_AMBULATORY_CARE_PROVIDER_SITE_OTHER): Payer: Medicare Other | Admitting: Podiatry

## 2014-03-04 ENCOUNTER — Encounter: Payer: Self-pay | Admitting: Podiatry

## 2014-03-04 VITALS — BP 145/60 | HR 70 | Resp 18

## 2014-03-04 DIAGNOSIS — B351 Tinea unguium: Secondary | ICD-10-CM

## 2014-03-04 DIAGNOSIS — M79673 Pain in unspecified foot: Secondary | ICD-10-CM

## 2014-03-04 NOTE — Patient Instructions (Signed)
Diabetes and Foot Care Diabetes may cause you to have problems because of poor blood supply (circulation) to your feet and legs. This may cause the skin on your feet to become thinner, break easier, and heal more slowly. Your skin may become dry, and the skin may peel and crack. You may also have nerve damage in your legs and feet causing decreased feeling in them. You may not notice minor injuries to your feet that could lead to infections or more serious problems. Taking care of your feet is one of the most important things you can do for yourself.  HOME CARE INSTRUCTIONS  Wear shoes at all times, even in the house. Do not go barefoot. Bare feet are easily injured.  Check your feet daily for blisters, cuts, and redness. If you cannot see the bottom of your feet, use a mirror or ask someone for help.  Wash your feet with warm water (do not use hot water) and mild soap. Then pat your feet and the areas between your toes until they are completely dry. Do not soak your feet as this can dry your skin.  Apply a moisturizing lotion or petroleum jelly (that does not contain alcohol and is unscented) to the skin on your feet and to dry, brittle toenails. Do not apply lotion between your toes.  Trim your toenails straight across. Do not dig under them or around the cuticle. File the edges of your nails with an emery board or nail file.  Do not cut corns or calluses or try to remove them with medicine.  Wear clean socks or stockings every day. Make sure they are not too tight. Do not wear knee-high stockings since they may decrease blood flow to your legs.  Wear shoes that fit properly and have enough cushioning. To break in new shoes, wear them for just a few hours a day. This prevents you from injuring your feet. Always look in your shoes before you put them on to be sure there are no objects inside.  Do not cross your legs. This may decrease the blood flow to your feet.  If you find a minor scrape,  cut, or break in the skin on your feet, keep it and the skin around it clean and dry. These areas may be cleansed with mild soap and water. Do not cleanse the area with peroxide, alcohol, or iodine.  When you remove an adhesive bandage, be sure not to damage the skin around it.  If you have a wound, look at it several times a day to make sure it is healing.  Do not use heating pads or hot water bottles. They may burn your skin. If you have lost feeling in your feet or legs, you may not know it is happening until it is too late.  Make sure your health care provider performs a complete foot exam at least annually or more often if you have foot problems. Report any cuts, sores, or bruises to your health care provider immediately. SEEK MEDICAL CARE IF:   You have an injury that is not healing.  You have cuts or breaks in the skin.  You have an ingrown nail.  You notice redness on your legs or feet.  You feel burning or tingling in your legs or feet.  You have pain or cramps in your legs and feet.  Your legs or feet are numb.  Your feet always feel cold. SEEK IMMEDIATE MEDICAL CARE IF:   There is increasing redness,   swelling, or pain in or around a wound.  There is a red line that goes up your leg.  Pus is coming from a wound.  You develop a fever or as directed by your health care provider.  You notice a bad smell coming from an ulcer or wound. Document Released: 01/13/2000 Document Revised: 09/17/2012 Document Reviewed: 06/24/2012 ExitCare Patient Information 2015 [employer]. This information is not intended to replace advice given to you by your health care provider. Make sure you discuss any questions you have with your health care provider.  

## 2014-03-04 NOTE — Progress Notes (Signed)
Patient ID: LENWARD DALES, male   DOB: [birth year], 81 y.o.   MRN: [id number]  Subjective: 81 year old male presents the office today with complaints of painful elongated toenails. He denies any recent redness or drainage from the nail sites. He has always nails are painful with pressure. No other complaints at this time. Denies any systemic complaints as fevers, chills, nausea, vomiting. Denies any recent ulcerations or any other problems.  Objective: AAO 3, NAD DP pulses palpable bilaterally, PT pulses nonpalpable. CRT less than 3 seconds Protective sensation intact with Simms was a moderate,, Achilles tendon reflex intact Nails hypertrophic, dystrophic, brittle, discolored, elongated 10. There subjective tenderness upon palpation of the nails. No surrounding erythema or drainage from the nail sites. No open lesions or pre-ulcerative lesions are identified. No other areas of tenderness to bilateral lower extremities. No overlying edema, erythema, increase in warmth bilaterally No pain with calf compression, swelling, warmth, erythema.  Assessment: 81 year old male with symptomatic onychomycosis  Plan: -Nails sharply debrided 10 without complications/bleeding. -Discussed daily foot inspection. -Follow-up in 3 months or sooner if any problems are to arise.

## 2014-05-21 NOTE — Consult Note (Signed)
PATIENT NAME:  Ryan Mcclure, HEMRIC MR#:  [id number] DATE OF BIRTH:  1931-06-30  DATE OF CONSULTATION:  06/30/2012  REFERRING PHYSICIAN:  Theodoro Grist, MD CONSULTING PHYSICIAN:  Gaylyn Cheers, MD / Corky Sox. Phyliss Hulick, PA-C  REASON FOR CONSULTATION:  Blood in stool.   HISTORY OF PRESENT ILLNESS: This is a pleasant 81 year old African American gentleman who has been hospitalized for the past several days with E. coli bacteremia and some shortness of breath suspicious for possible pneumonia. He did actually have plans to be discharged today until this morning the nurse began noticing blood in his stool. Hemoglobin today is 8.9 and yesterday it was 9.6. He does have a history of obscure GI bleeding in the past and most recently underwent an EGD and colonoscopy in 2009, by Dr. Loistine Simas. At that time colonoscopy was notable for diverticulosis as well as a single colon polyp. EGD was notable for an erosive gastropathy with stigmata of recent bleeding. He also has had to undergo capsule endoscopy in the past and has been found to have small bowel AVMs. Per the patient, he has over the past several months noticed intermittent blood in his stool for which he mentioned to his primary care doctor and did refer him to GI. He does actually have an appointment scheduled on 06/19 as an outpatient with our practice for evaluation of this. Of note, he is due for repeat colonoscopy this year regardless. He had just recently returned back to his room after undergoing a nuclear medicine tagged red blood cell scan. These results are still pending. Currently, when speaking with the patient, he states that he is overall feeling well today and has no current symptomatic complaints. He does state that last night he was feeling short of breath, but denies any and these currently. No nausea or vomiting. No abdominal or rectal pain. No melena. No heartburn, indigestion or trouble with his appetite. No unintentional weight changes.    ALLERGIES: STEROIDS.   PAST MEDICAL HISTORY: Small bowel AVMs, erosive gastritis, iron deficiency anemia, hypertension, history of a MI, diabetes mellitus, peripheral vascular disease, coronary artery disease, chronic kidney disease stage III, aortic stenosis, history of a CVA x 2, one in 2008 and one in 2011, vocal cord injury requiring a PEG tube, this has subsequently been reversed, dyslipidemia, ischemic cardiomyopathy with systolic congestive heart failure.   PAST SURGICAL HISTORY: Cardiac stent placement, right carotid endarterectomy, CABG, aortic valve replacement and a PEG tube placed in April 2011 and then was subsequently reversed.   SOCIAL HISTORY: Remote history of tobacco use. Negative for current tobacco, alcohol or illicit drug use.   FAMILY HISTORY: Negative for GI malignancy, colon polyps or IBD.   HOME MEDICATIONS: Omeprazole, nitroglycerin, metformin, Lipitor, Lasix, Lantus, Humalog and ferrous sulfate.   REVIEW OF SYSTEMS:  A 10 system review was obtained on the patient. Pertinent positives are mentioned above and otherwise negative.   PHYSICAL EXAMINATION: VITAL SIGNS: Blood pressure 147/75, heart rate 77, respirations 20, temperature 99.5, bedside pulse ox 97%.  GENERAL: This is a pleasant 81 year old gentleman resting quietly and comfortably in the exam room, accompanied by a family member at bedside, in no acute distress. Alert and oriented x 3.  HEAD: Atraumatic, normocephalic.  NECK: Supple. No lymphadenopathy noted.  EENT: Sclerae anicteric. Mucous membranes moist. He does have a nasal cannula in place.  LUNGS: Respirations are even and unlabored, clear to auscultation in bilateral anterior lung fields.  HEART:  Regular rate and rhythm. S1 and S2 noted.  ABDOMEN: Soft, nontender and nondistended.  RECTAL:  Was declined by the patient.  EXTREMITIES: Negative for lower extremity edema, 2+ pulses noted bilaterally.  PSYCHIATRIC: Appropriate mood and affect.    LABORATORY AND DIAGNOSTICS:  White blood cells 12.6, hemoglobin 8.9 down from 9.6, hematocrit 26.5, platelets 93 and MCV 82. Sodium 139, potassium 4.2, BUN 16, creatinine 1.64 and glucose 186.   Imaging: A CT scan of the abdomen on 05/30 was notable for a left lung base solid density that has been evaluated by internal medicine, but there is no evidence of obstructive or inflammatory changes. There was diverticulosis noted in the descending colon, but no evidence of diverticulitis.   ASSESSMENT: 1.  Blood in the stool.  2.  Iron deficiency anemia.  3.  History of obscure gastrointestinal bleeding including erosive gastropathy as well as small bowel arteriovenous malformations. 4.  History of diverticulosis and colon polyps due for colonoscopy September 2014.  5.  Recent Escherichia coli bacteremia.  6.  Possible pneumonia with recent systemic inflammatory response syndrome.  7.  History of chronic kidney disease, cerebrovascular accident, myocardial infarction, ischemic cardiomyopathy with systolic congestive heart failure, coronary artery disease and aortic stenosis.   PLAN: I have discussed this patient's case in detail with Dr. Gaylyn Cheers who is involved in the development of the patient's plan of care. The patient did recently return back to the room after undergoing a nuclear medicine bleeding scan and therefore the results are still pending. We do agree with holding all blood thinners, checking serial hemoglobins and being prepared to transfuse as necessary. We will await the findings of the bleeding scan to make further recommendations regarding endoscopic intervention. The patient does have an outpatient appointment already scheduled for June 19th to discuss repeating colonoscopy, however, should the patient not be stable enough for discharge we can certainly consider luminal evaluation prior to his discharge. This will all be pending the findings of the bleeding scan. This was discussed  in detail with the patient who verbalized understanding and is in agreement. All questions were answered.   Thank you so much for this consultation and for allowing [place] to participate in the patient's plan of care. We will follow along with you.   These services are provided by Corky Sox. Zettie Pho, PA-C under collaborative agreement with Manya Silvas, MD  ____________________________ Corky Sox. Jahzier Villalon, PA-C [id number] D: 06/30/2012 15:41:59 ET T: 06/30/2012 16:04:14 ET JOB#: [id number]  cc: Corky Sox. Mylisa Brunson, PA-C, <Dictator> [place] PA ELECTRONICALLY SIGNED 07/02/2012 15:50

## 2014-05-21 NOTE — Consult Note (Signed)
PATIENT NAME:  Ryan Mcclure, Ryan Mcclure MR#:  [id number] DATE OF BIRTH:  19-Jul-1931  DATE OF CONSULTATION:  06/30/2012  REFERRING PHYSICIAN:  Theodoro Grist, MD   CONSULTING PHYSICIAN:  Heinz Knuckles. Agustina Witzke, MD  REASON FOR CONSULTATION: E. coli bacteremia.   HISTORY OF PRESENT ILLNESS: The patient is a 81 year old male with a past history significant for diabetes, diverticulitis, aortic stenosis, status post aortic valve replacement who was admitted with the abrupt onset of rigors. The patient had been walking to pick up lunch at the cafeteria at [facility] for he and his wife when he began developing the rigors. He had thought this was likely due to hypoglycemia, which has happened to him before, but the nurses at [facility] had checked his sugar and it was actually high. He was brought to the Emergency Room and was found to have fever. He was admitted to the hospital and started on Zosyn and azithromycin. He was subsequently changed to ceftriaxone yesterday when his blood cultures grew E. coli that was pansensitive. His chest x-ray initially showed no obvious infiltrate, but an abdominal and pelvic CT scan showed an indeterminant density in the left lung. A CT scan of the chest, however, showed atelectasis versus minimal infiltrate in the left lobe.  There is a questionable density in the right lobe. He denies any respiratory symptoms. He is currently feeling better but still is somewhat fatigued. No further rigors.   ALLERGIES: INCLUDE STEROIDS.   PAST MEDICAL HISTORY: 1.  Diabetes.  2.  Aortic stenosis status post aortic valve replacement.  3.  Coronary artery disease, status post CABG.  4.  Recurrent stroke.  5.  Left hemianopsia.  6.  Chronic renal insufficiency.  7.  Hypertension.  8.  Peripheral vascular disease.  9.  Status post carotid endarterectomy bilaterally.  10.  Status post cardiac arrest.  11.  Hypercholesterolemia.  12.  Iron deficiency anemia.  13.  GI bleeds due to AVM.  14.   Diverticulosis.  15.  Diverticulitis.  55.  Ischemic cardiomyopathy with CHF.   FAMILY HISTORY: Positive for diabetes and renal disease in his mother, MI in his sister.   SOCIAL HISTORY: He is a prior smoker but has not smoked in years. He does not drink. He lives with his wife in [place].    REVIEW OF SYSTEMS:  GENERAL: No fevers. Positive  chills and rigors, sweats. Positive fatigue.  HEENT: No headaches. No sinus congestion. No sore throat.  NECK: No stiffness. No swollen glands.  RESPIRATORY: No cough. No shortness of breath. No sputum production.  CARDIAC: No chest pains or palpitations.  GASTROINTESTINAL: No nausea, no vomiting, no abdominal pain, no change in his bowels.  GENITOURINARY: No change in his urine.  MUSCULOSKELETAL: No complaints other than generalized fatigue.  No specific joint or muscle pains.  SKIN: No rashes.  NEUROLOGIC: No weakness focally.  He does have the chronic hemianopsia from his prior strokes.  PSYCHIATRIC:  No complaints.   Dictation ended here. Please see addendum for continuation of dictation.   ____________________________ Heinz Knuckles Nyja Westbrook, MD meb:cb D: 06/30/2012 15:32:00 ET T: 06/30/2012 15:41:46 ET JOB#: [id number]  cc: Heinz Knuckles. Chimere Klingensmith, MD, <Dictator> Robin Pafford E Delroy Ordway MD ELECTRONICALLY SIGNED 07/01/2012 11:44

## 2014-05-21 NOTE — Consult Note (Signed)
CC: E. coli pneumonia.  Pt had rectal bleeding once today.  Has hx of diverticulosis, AVM of small bowel, erosive gastropathy.  He has not passed blood since the one time before lunch today.  No current abd pain.  Pt had aq V_Q scan today.  If he passes any more blood tomorrow he should get a bleeding scan.  Due to pneumonia I would prefer to wait on endoscopic tests if at all possible.  See consult from Loren Racer.PA.  Electronic Signatures: Manya Silvas (MD)  (Signed on 02-Jun-14 20:06)  Authored  Last Updated: 02-Jun-14 20:06 by Manya Silvas (MD)

## 2014-05-21 NOTE — Consult Note (Signed)
Brief Consult Note: Diagnosis: Elevated troponin, in setting of sepsis with fever and rigors, suggests demand supply ischemia especially in absence of CP or SOB.   Patient was seen by consultant.   Consult note dictated.   Comments: REC  Agree with overall plan, cont Lovenox for now but be careful in light of anemia, hold BB for HR , 60 bpm, review echo, consider ID consult, defer cardiac cath in absence of CP and ongoing bacteremia.  Electronic Signatures: Isaias Cowman (MD)  (Signed 31-May-14 14:52)  Authored: Brief Consult Note   Last Updated: 31-May-14 14:52 by Isaias Cowman (MD)

## 2014-05-21 NOTE — Consult Note (Signed)
Brief Consult Note: Diagnosis: ecoli bacteremia, possible PNA, blood in stool.   Discussed with Attending MD.   Comments: Patient not currently in room...down getting Nuclear Med Tagged RBC scan.  Per reports, patient had plans to be d/c today, but began having bloody BMs. Patient sees Dr Gustavo Lah as an outpt. Last in 2009 had an EGD/colonoscopy notable for diverticulosis, colon polyp, erosive gastropathy with stigmata of recent bleed. Patient has also had a capsule endoscopy notable for small bowel AVMs. source of rectal bleeding currently unclear. Could be diverticular. Agree with holding blood thinners for now. Will await RBC scan and discuss with patient to obtain full history when he returns to his room. Will dictate full consult once feasible.  Electronic Signatures: Sherald Barge (PA-C)  (Signed 02-Jun-14 15:11)  Authored: Brief Consult Note   Last Updated: 02-Jun-14 15:11 by Sherald Barge (PA-C)

## 2014-05-21 NOTE — H&P (Signed)
PATIENT NAME:  Ryan Mcclure, Ryan Mcclure MR#:  [id number] DATE OF BIRTH:  1931/12/18  DATE OF ADMISSION:  06/27/2012  REFERRING PHYSICIAN: Arman Filter, MD  PRIMARY CARE PHYSICIAN: Irven Easterly. Kary Kos, MD  CARDIOLOGIST: Corey Skains, MD  CHIEF COMPLAINT: Significant chills and shaking.   HISTORY OF PRESENT ILLNESS: [name] is a very nice 81 year old gentleman who has a very complex history of CVA with peripheral lack of vision with left-side hemianopsia, chronic kidney disease, hypertension, peripheral vascular disease, status post carotid endarterectomy, coronary artery disease, who is status post coronary artery bypass graft in February 2013. He is also a patient who has a significant history of cardiac arrest with cardiopulmonary resuscitation and intubation with vocal cord injury after a stent of the LAD and circumflex at [facility] in 2011. He also has diabetes, dyslipidemia, ischemic cardiomyopathy with an ejection fraction of 40%, iron deficiency anemia and severe aortic stenosis, for what he underwent an aortic valve repair as well. The patient stays at [facility] and today around 12:30 he was going to down to the cafeteria for lunch and started shaking all over. He said that he could not stop shaking, felt really cold and clammy, for what he thought that his blood sugar was low. The RN and checked his blood sugar and it was within normal limits and since the patient continued to shake and was so clammy, they decided to call EMS. EMS brought him here  to the ER. In the ER, the patient was found to have a temperature of  102 oral and a pulse of 124. His blood sugar was 159 and the patient started evaluation here at the ER. In the ER, his evaluation has not shown any significant focus of infection, for what the patient is admitted in the hospital and the hospitalist services have been asked to put the patient in the hospital. Overall talking to the patient, he can tell me that he has not been  feeling any different than he usually feels. He states that he has been feeling really weak since his CABG on 03/2011, but other than that, he has not had any significant cough, sputum, shortness of breath, diarrhea or constipation. He does have occasional blood in his stool and has a history of diverticulosis and diverticulitis in the past, but he states that he just does not know if anything else is going on. He has an appointment to see his GI doctor for this in the next week. CT scan of the abdomen has been ordered stat to evaluate for diverticulitis, as the patient has an elevation of  white count and tenderness whenever his left lower quadrant is palpated. No other tenderness of his abdomen.   REVIEW OF SYSTEMS: A 12-system review of systems is done.  CONSTITUTIONAL: Denies any fever, fatigue, weakness, weight loss or weight gain.  EYES: No blurry vision. Positive left-side hemianopsia, peripheral vision deficit. No glaucoma. No cataracts.  EARS, NOSE, THROAT: No tinnitus, ear pain, hearing loss, postnasal drip or difficulty swallowing. No nasal congestion.  RESPIRATORY: No cough. No wheezing. No hemoptysis. No painful respirations.  CARDIOVASCULAR: No chest pain, orthopnea, edema, arrhythmias, palpitations or syncope.  GASTROINTESTINAL: No nausea, vomiting, diarrhea, constipation. Abdominal pain only with palpation of the left lower quadrant. No hematemesis. No jaundice. Positive bright red blood per rectum occasionally. No hemorrhoids.  GENITOURINARY: No dysuria, hematuria or changes in frequency. He has chronic kidney disease at baseline. No prostatitis.  ENDOCRINE: No polyuria, polydipsia, polyphagia, cold or heat  intolerance.  HEMATOLOGIC AND LYMPHATIC: No anemia, easy bruising, bleeding or swollen glands.  SKIN: No rashes, petechiae or lesions. He had a toenail that was infected and it was removed. When the toenail is checked, there are no signs of infection at this moment.  MUSCULOSKELETAL:  No significant neck pain, back pain, shoulder pain, gout or swollen joints.  NEUROLOGIC: No numbness, tingling in any part of his body at this moment. He had a CVA with hemianopsia on the left side. No seizures. No memory loss.  PSYCHIATRIC: No insomnia, depression or nervousness.   PAST MEDICAL HISTORY: 1.  Coronary artery disease, status post CABG on 03/14/2011.  2.  Severe aortic stenosis, status post aortic valve replacement on 03/14/2011.  3.  CVA in 2008 and 2011.  4.  Left-sided hemianopsia.  5.  Chronic  kidney disease with baseline around 1.7.  6.  Hypertension.  7.  Peripheral vascular disease with carotid endarterectomy bilaterally.  8.  History of cardiac arrest, status post LAD and circumflex stent placement.  9.  Vocal cord injury requiring PEG placement and long-time intubation at [facility], now all those issues resolved.  10.  Diabetes.  11.  Dyslipidemia.  12.  Iron deficiency anemia.  13.  History of GI bleeding due to AVMs on intestine after capsule endoscopy and also diverticulosis and diverticulitis.  14.  History of erosive gastropathy.  15.  Ischemic cardiomyopathy with systolic congestive failure, compensated at this moment. Last echocardiogram in 2013, ejection fraction of 40%.  16.  New right bundle branch block detected today.   PAST SURGICAL HISTORY:  1.  CABG in 2013.  2.  Aortic valve replacement, bioprosthetic valve, 2013.  3.  PEG tube placement in April 2011, reversal in May.  4.  Cardiac catheterizations. 5.  Bilateral carotid endarterectomy.   ALLERGIES: THE PATIENT STATES HE IS ALLERGIC TO STEROIDS.   FAMILY HISTORY: Positive for diabetes and chronic kidney disease in his mom and a myocardial infarction in his sister who had a coronary artery bypass graft.   SOCIAL HISTORY: The patient is a former smoker, but he has not smoked in a long time. He cannot tell me for how long. He does not drink. He lives in [place].   MEDICATIONS: Omeprazole 40 mg  once daily, nitroglycerin 0.5 mg p.r.n., metformin 500 mg once a day, Lipitor 40 mg once a day, Lasix 40 mg takes 1/2 tablet twice daily, Lantus 25 units subcutaneously at bedtime, Humalog as needed for sliding scale, ferrous sulfate 325 mg 3 times a day.   PHYSICAL EXAMINATION: VITAL SIGNS: Blood pressure 142/71, temperature 102, pulse 124, respirations 18, oxygen saturation 96% on room air.  GENERAL: The patient is alert, oriented x 3. No acute distress. No respiratory distress. Hemodynamically stable.  HEENT: Pupils are equal and reactive. Extraocular movements are intact. Mucosae are moist. Anicteric sclerae. Pink conjunctivae. No oral lesions. No oropharyngeal exudates.  NECK: Supple. No JVD. No thyromegaly. No adenopathy. No carotid bruits. Scars from carotid artery endarterectomy present. No rigidity. No masses.  CARDIOVASCULAR: Regular rate and rhythm. No murmurs, rubs or gallops are appreciated. Occasional PVCs. No tenderness to palpation of anterior chest wall. No displacement of PMI.  LUNGS: Clear without any wheezing or crepitus. No use of accessory muscles. The patient has decreased respiratory sounds in bases. No significant respiratory distress whatsoever. Good air entrance. Good respiratory effort.  ABDOMEN: Tenderness to palpation of left lower quadrant and the angle of the intestine. No rebound tenderness. No guarding. Bowel  sounds bowel sounds are positive.  GENITAL: No external lesions.  EXTREMITIES: No edema, cyanosis or clubbing. Pulses +2. Capillary refill less than 3.  MUSCULOSKELETAL: No significant deformity of extremities. No tenderness of joints. No effusion. Range of motion within normal limits. Toenails are cut and clean at this moment. There was removal of the left toenail on the big toe, and there are no signs of infection or cellulitis there.  SKIN: No rashes, petechiae or other lesions.  LYMPHATIC: Negative for lymphadenopathy in neck or supraclavicular areas.   NEUROLOGIC: Cranial nerves II through XII intact. Deep tendon reflexes +2 symmetrically. Strength is 5 out of 5 in all 4 extremities.  PSYCHIATRIC: No significant agitation. The patient is alert, oriented x 3. He is very cooperative.   LABORATORY, DIAGNOSTIC AND RADIOLOGICAL DATA: Blood sugar 129, creatinine 1.37, BUN 21, GFR of 49, total bilirubin 0.4, alkaline phosphatase 111, AST 32. Troponin is negative x 1. Thyroid-stimulating hormone 0.46. White count elevated at 11.5, hemoglobin 11, platelets 103. Urinalysis: Negative nitrites and leukocyte esterase, no white blood cells, no red blood cells.   EKG: Sinus tachycardia up to 124. New finding after comparing EKG with last EKG done at [facility] showed a new right bundle branch block and some intraventricular conduction delay in lateral leads as well. There is some inversion of T waves in lateral leads, which could be a reflection of the right bundle branch. LVH, occasional PVCs.   CT scan of the abdomen pending. I do see on my own some thickening of the wall of the colon at the level of the sigmoid colon. I am awaiting for full report from radiologist. There is diffuse diverticulosis.  ASSESSMENT AND PLAN: A 81 year old gentleman with history of multiple medical problems including diabetes, hypertension, coronary artery disease, cerebrovascular accident,  chronic kidney disease, peripheral vascular disease, iron deficiency anemia, history of previous gastrointestinal bleeding, history of previous diverticulitis and multiple arteriovenous malformations,  comes with unspecific concerns of shaking all over. He was found out to be very tachycardic and with a temperature of 102.  1.  Systemic inflammatory response syndrome: The patient is admitted for the evaluation of this condition. At this moment, my suspicion is that he has an intra-abdominal process, likely to be diverticulitis,  as the patient has a fever of 102, tachycardia and elevation of his  white count of 11.5. The patient is poly-cultured. Urine culture is negative. Chest x-ray does not have any significant findings that could indicate respiratory infection. His only source at this moment suspicions is his abdomen. The patient is started on Zosyn. He is hemodynamically stable. His heart rate is starting to come down. I am going to give him a little bit more of intravenous fluids, as the patient looks slightly dehydrated.  2.  New right bundle branch block: At this moment, the patient is asymptomatic as far as his heart, other than being tachycardic. He does not have any chest pain or increased shortness of breath. The EKG was compared with previous EKG from Dr. Alveria Apley office in August 2013 and at that moment, he only had occasional bigeminy, but no right bundle branch or left bundle branch block. At this moment, the patient is asymptomatic again. His troponin is indeterminate at 0.05. We are going to do serial troponins and obtain a cardiology consultation by Dr. Nehemiah Massed, who is his primary cardiologist. Electrolytes checked. Add magnesium, as it has not been checked.  3.  Tachycardia: This could be related to systemic inflammatory response syndrome.  TSH is normal. The patient also is slightly dehydrated. Intravenous fluids given.  4.  Coronary artery disease: Continue treatment with Lipitor. The patient is not currently taking any aspirin due to his previous arteriovenous malformations and he is not on a beta blocker, which I am not sure why he is  not on it. We are going to let Dr. Nehemiah Massed let us know. 5.  History of cerebrovascular accident: The patient used to take Aggrenox, as he has had a cerebrovascular accident 2 times in 1 year, but at this moment, he is not taking Aggrenox or aspirin and this is likely due to his previous gastrointestinal bleeding and arteriovenous malformations. 6.  Congestive heart failure, systolic, compensated/ischemic cardiomyopathy with an ejection fraction  of 40%: At this moment, the patient is completely asymptomatic. Again, he is not on a beta blocker or an angiotensin receptor blocker that he can tell me. I am going to defer that care to Dr. Nehemiah Massed.  7.  Chronic kidney disease: At this moment is stable with creatinine of 1.3. Just continue to monitor.  8.  Diabetes: Continue treatment with Lantus and metformin and insulin sliding scale.  9.  The patient is a full code.  10.  Deep venous thrombosis prophylaxis with low-dose heparin. Monitor for possible gastrointestinal bleeding.  11.  Gastrointestinal prophylaxis with proton pump inhibitor.   TIME SPENT: I spent about 50 minutes with this admission.   ____________________________ [place] Sink, MD rsg:jm D: 06/27/2012 16:30:49 ET T: 06/27/2012 19:23:54 ET JOB#: [id number]  cc: [place] Sink, MD, <Dictator> Dalbert Stillings America Brown MD ELECTRONICALLY SIGNED 07/07/2012 1:55

## 2014-05-21 NOTE — Consult Note (Signed)
CC: sepsis.  Pt with anemia, hgb varies between 8.7- 9.7,  no bleeding. He needs to follow up with Dr. Gustavo Lah in 2 weeks.  I will sign off, reconsult if needed.  Electronic Signatures: Manya Silvas (MD)  (Signed on 04-Jun-14 15:01)  Authored  Last Updated: 04-Jun-14 15:01 by Manya Silvas (MD)

## 2014-05-21 NOTE — Consult Note (Signed)
PATIENT NAME:  Ryan Mcclure, Ryan Mcclure MR#:  [id number] DATE OF BIRTH:  Feb 22, 1931  DATE OF CONSULTATION:  06/30/2012  REFERRING PHYSICIAN:   CONSULTING PHYSICIAN:  Heinz Knuckles. Leverne Tessler, MD  ADDENDUM:  Continuation of dictation.  PHYSICAL EXAMINATION: VITAL SIGNS: T-max 100.3, T-current 99.5, pulse 77, blood pressure 147/75 and 97% on 2 liters.  GENERAL: A 81 year old black man in no acute distress.  HEENT: Normocephalic, atraumatic. Pupils equal and reactive to light. Extraocular motion intact. Sclerae, conjunctivae and lids are without evidence for emboli or petechiae. Oropharynx shows no erythema or exudate. Gums are in fair condition.  NECK: Supple. Full range of motion. Midline trachea. No lymphadenopathy. No thyromegaly.  LUNGS: Chest clear to auscultation bilaterally with good air movement. No focal consolidation.  HEART: Regular rate and rhythm without murmur, rub or gallop.  ABDOMEN: Soft, nontender and nondistended. No hepatosplenomegaly. No hernia is noted.  EXTREMITIES: No evidence for tenosynovitis.  SKIN: No rashes. No stigmata of endocarditis, specifically no Janeway lesions or Osler nodes.  NEUROLOGIC: The patient was awake and interactive, moving all 4 extremities.  PSYCHIATRIC: Mood and affect appeared normal.   LABORATORY AND DIAGNOSTICS: BUN 16, creatinine 1.64, bicarbonate 27, anion gap 4. LFTs are unremarkable. White count 12.6, hemoglobin 8.4, platelet count 93 and ANC 10.2. Sputum culture is pending.  Urine culture had 2000 CFU/mL of an unidentified organism. A urinalysis was unremarkable. Blood cultures are growing E. coli that is pansensitive.   A chest x-ray from admission shows no infiltrates. A CT scan of the abdomen and pelvis without contrast shows an indeterminant solid density in the left lung. There is no evidence for diverticulosis or an inflammatory process in the abdomen. CT scan of the chest without contrast showed an irregularly marginated minimal density in the  inferior right upper lobe and minimal infiltrate versus atelectasis in the left lower lobe. Chest x-ray from yesterday showed minimal right base atelectasis.   IMPRESSION: This is a 81 year old male with a history of diabetes, aortic stenosis status post aortic valve replacement and diverticulitis who was admitted with Escherichia coli sepsis.   RECOMMENDATIONS: 1.  He is doing better clinically. No obvious etiology. His urine looks okay. Chest x-ray and CT show a possible infiltrate, but he has not had significant respiratory symptoms. His CT of the abdomen and pelvis did not show any evidence for infection.  2.  We will change to p.o. Keflex.  3.  Would treat for 14 days.  4.  No need to repeat the blood cultures to document clearance for the gram-negative rod.   This is a moderately complex infectious disease case. Thank you very much for involving me in [name] care.  ____________________________ Heinz Knuckles. Adriana Lina, MD meb:sb D: 06/30/2012 15:37:09 ET T: 06/30/2012 15:58:40 ET JOB#: [id number]  cc: Heinz Knuckles. Hanaan Gancarz, MD, <Dictator> Chauna Osoria E Darcell Yacoub MD ELECTRONICALLY SIGNED 07/01/2012 11:43

## 2014-05-21 NOTE — Consult Note (Signed)
PATIENT NAME:  Ryan Mcclure, DANN MR#:  [id number] DATE OF BIRTH:  [birth year]  DATE OF CONSULTATION:  06/28/2012  CONSULTING PHYSICIAN:  Isaias Cowman, MD  PRIMARY CARE PHYSICIAN:  Dr.  Kary Kos  CARDIOLOGIST:  Dr. Nehemiah Massed.   CHIEF COMPLAINT: Shaking, chills.   REASON FOR CONSULTATION: Consultation requested for evaluation of elevated troponin.   HISTORY OF PRESENT ILLNESS: The patient is a 81 year old gentleman with known coronary artery status post bypass graft surgery, cerebrovascular disease, status post carotid endarterectomy and chronic kidney disease. The patient currently is a resident at [facility]. The patient was going to the cafeteria for lunch and started to experience shaking chills with diaphoresis. The patient was seen by a nurse, checked his blood pressure, which was within normal limits. EMS was called. He was brought to [facility] Emergency Room where he was noted to be febrile with a temperature of 102 and tachycardic. The patient was admitted. Admission labs were notable for initial troponin of 0.05 and a mildly elevated white count 11.5. Follow-up troponin was 4.8 and white count 13.4. The patient also was anemic on follow-up blood panel with a hemoglobin and hematocrit 9.2 and 28.8, respectively. Blood cultures were positive for gram-negative rods. EKG revealed sinus tachycardia with right bundle branch block. The patient denies chest pain or shortness of breath   PAST MEDICAL HISTORY: 1.  Coronary artery disease, status post coronary artery bypass graft surgery, 03/14/2011.  2.  Aortic valve replacement 03/14/2011.  3.  Mild ischemic cardiomyopathy, LVF of 40%.  4.  History of cerebrovascular accident 2008 and 2011.  5.  History of gastrointestinal bleed with arteriovenous malformations.  6.  History of diverticulosis and diverticulitis.  7. Chronic kidney disease.  8.  Hypertension.  9.  Status post bilateral carotid endarterectomy.  10.  Diabetes.  11.  Hypertension.    12.  Hyperlipidemia   MEDICATIONS: Lipitor 40 mg daily,  Lasix 20 mg b.i.d., Lantus 25 units at bedtime, ferrous sulfate 325 mg t.i.d., omeprazole 40 mg daily.   SOCIAL HISTORY: The patient previously smoked. He currently lives with his wife at [facility].   FAMILY HISTORY:  The patient reports a sister status post myocardial infarction and coronary artery bypass graft surgery.   REVIEW OF SYSTEMS: CONSTITUTIONAL: The patient has shaking chills on admission.  EYES: The patient has left-sided peripheral vision deficit.  EARS: No hearing loss.  RESPIRATORY: No shortness of breath.  CARDIOVASCULAR: No chest pain.  GASTROINTESTINAL: No nausea, vomiting, diarrhea or constipation.  GENITOURINARY: No dysuria or hematuria.  ENDOCRINE: No polyuria or polydipsia.  HEMATOLOGICAL: No easy bruising or bleeding.  INTEGUMENTARY: No rash no petechiae.  MUSCULOSKELETAL: No arthralgias or myalgias.  NEUROLOGICAL: No focal muscle weakness or numbness. The patient had a previous CVA with left-sided hemianopsia.  PSYCHIATRIC: The patient denies depression or anxiety.   PHYSICAL EXAMINATION: VITAL SIGNS: Blood pressure 124/57, pulse 58, respirations 18, temperature 98.2, pulse oximetry 98%.  HEENT: Pupils equal, reactive to light and accommodation.  NECK: Supple without thyromegaly.  LUNGS:  Clear  HEART: Normal JVP. Normal PMI. Regular rate and rhythm. Normal S1, S2. Grade I/VI systolic murmur.  ABDOMEN: Soft and nontender.  EXTREMITIES: There is no cyanosis, clubbing or edema.  SKIN:  There is no rash or evidence of petechiae.   MUSCULOSKELETAL: Normal muscle tone.  NEUROLOGIC: The patient is alert and oriented x 3. Motor and sensory both grossly intact.   IMPRESSION: This is a 81 year old gentleman with known coronary artery disease as well as  aortic valve replacement, who presents with fever and shaking chills with tachycardia found to have elevated white count. Positive blood cultures for  gram-negative rods. The patient has elevated troponin without chest pain or shortness of breath.   RECOMMENDATIONS: 1.  Agree with overall current therapy.  2.  Continue Lovenox for now.  3.  Need to carefully monitor hemoglobin and hematocrit. The patient currently appears anemic.  4.  Review 2-D echocardiogram. I assume the patient has a bioprosthetic aortic valve based on physical examination and patient currently not taking warfarin.  5.  Would defer invasive cardiac evaluation in the absence of chest pain.  6.  Elevated troponin likely due to demand supply ischemia in the setting of sepsis in the absence of chest pain.  7.  Consider infectious disease consult for in light of the patient's complex presentation.    ____________________________ Isaias Cowman, MD ap:cc D: 06/28/2012 14:47:43 ET T: 06/28/2012 17:40:55 ET JOB#: [id number]  cc: Isaias Cowman, MD, <Dictator> Isaias Cowman MD ELECTRONICALLY SIGNED 07/07/2012 8:48

## 2014-05-21 NOTE — Consult Note (Signed)
Impression:   81yo male w/ h/o DM, aortic stenosis, s/p AVR, divericulitis admitted with E. coli sepsis.   He is doing better clinically.  No obvious etiology.  Urine looks ok.  CXR and CT shows possible infiltrate, but he has not had significant respiratory symptoms.  His CT of the abd/pelvis did not show evidence for infection. 2)   Will change to po keflex.  Would treat for 14 days.  No need to repeat BCx to document clearance.  Electronic Signatures: Jaylissa Felty MPH, Heinz Knuckles (MD)  (Signed on 02-Jun-14 15:28)  Authored  Last Updated: 02-Jun-14 15:28 by Ami Thornsberry MPH, Heinz Knuckles (MD)

## 2014-05-21 NOTE — Consult Note (Signed)
CC: sepsis.  Pt with E. coli and pseudomonas in blood. I am uncertain about origin of this.  With 2 bugs in his blood and an artificial aortic valve does he need extended treatment longer than usual 2 weeks?  Defer to Dr. Clayborn Bigness.  Does he need colonoscopy for this?  Would it be better to wait til treatment finished before doing this?  He has follow up appt in 2 weeks so will wait til he sees Dr. Gustavo Lah to do a colon unless his bleeding becomes significant.  Hbg stable over last few days.  Electronic Signatures: Manya Silvas (MD)  (Signed on 03-Jun-14 18:34)  Authored  Last Updated: 03-Jun-14 18:34 by Manya Silvas (MD)

## 2014-05-21 NOTE — Discharge Summary (Signed)
PATIENT NAME:  Ryan Mcclure, BACH MR#:  [id number] DATE OF BIRTH:  [birth year]  DATE OF ADMISSION:  06/27/2012 DATE OF DISCHARGE:  07/02/2012  ADMITTING DIAGNOSIS:  Systemic inflammatory response reaction.   DISCHARGE DIAGNOSES:  1.  Escherichia coli Pseudomonas sepsis with bacteremia, unknown infection source, questionable pneumonia with sputum cultures showing normal flora.  2.  Elevated troponin, (Dictation Anomaly) Not an acute coronary syndrome, but demand ischemia per cardiology.  3.  Hypertension, poorly-controlled.  4.  Diabetes mellitus with hemoglobin A1c 6.9.  5.  Acute on chronic renal failure, now off metformin.  6.  Sore throat.  7.  Episode of hypoxia, resolved.  VQ scan is low probability for pulmonary embolism.  8.  Rectal bleeding, resolving.  9.  History of coronary artery disease.  10.  Coronary artery bypass grafting. 11.  Aortic valve replacement.  12.  Cerebrovascular accident.  13.  Hyperlipidemia. 14.  Iron deficiency anemia.  15.  Cardiomyopathy with ejection fraction of 40%.   DISCHARGE CONDITION:  Stable.   DISCHARGE MEDICATIONS:  The patient is to continue:   1.  Lipitor 40 mg by mouth at bedtime.  2.  Iron sulfate 325 by mouth 3 times daily.  3.  Humalog sliding scale as needed.  4.  Nitroglycerin 0.4 mg sublingually every 5 minutes as needed.  5.  Omeprazole 40 mg by mouth daily.  6.  Lasix 20 mg by mouth twice daily.  7.  Nitroglycerin topically daily.  8.  Insulin glargine 30 units subcutaneously at bedtime.  9.  Metoprolol 25 mg by mouth twice daily.  10.  Amlodipine 10 mg by mouth once daily.  11.  Ciprofloxacin 500 mg by mouth twice daily through 07/15/2012 or for 13 more days after discharge.    DIET:  2 gram salt, low fat, low cholesterol, carbohydrate-controlled diet, mechanical soft, easy to digest, low residual.   ACTIVITY LIMITATIONS:  As tolerated.   FOLLOW-UP APPOINTMENT:  With Dr. Kary Kos in two days after discharge.  Dr. Gustavo Lah in 1  to 2 weeks after discharge.  The patient was advised also for follow-up with Dr. Kary Kos to have his hemoglobin level checked in the next few days after discharge.    CONSULTANTS:  Dr. Vira Agar, Dr. Clayborn Bigness, Dr. Saralyn Pilar, care management.   RADIOLOGIC STUDIES:  Chest x-ray, portable single view, 06/27/2012 showed no acute cardiopulmonary disease.  CT scan of abdomen and pelvis without contrast 06/27/2012 showed an indeterminant solid left lung base mass.  Differential considerations are consolidated infiltrate, rounded atelectasis, but pathologist said chest mass cannot be excluded.  Further evaluation with a repeated chest CT with contrast is recommended.  No CT evidence of obstructive or inflammatory abnormalities.  Diverticulosis is appreciated.  It was in the descending colon.  CT scan of chest without contrast 06/28/2012 revealed irregularly marginated minimal density in the anterior right upper lobe.  Follow-up is recommended with noncontrast CT in 3 to 6 months.  Atelectasis versus minimal infiltrate in the left lower lobe.  No significant effusion.  (Dictation Anomaly) Prominent atherosclerotic calcifications including coronary artery calcification, coronary artery bypass graft changes, possible nephrolithiasis.  Repeated chest x-ray, PA and lateral, 06/29/2012 showed minimal right lung base atelectasis and possible trace pleural thickening versus pleural effusion on the left lung.  VQ scan 06/30/2012 revealing low probability ventilation perfusion evaluation of pulmonary arterial embolic disease.   HISTORY OF PRESENT ILLNESS:  The patient is a 81 year old African American male with past medical history significant for history of gastrointestinal bleed  in the past who presented to the hospital on 06/27/2012 with complaints of chills and shakes.  Please refer to Dr. Laurin Coder Gutierrez's admission note on 06/27/2012.  On arrival to the hospital, the patient's temperature was 102, pulse 124, respiratory  rate was 18, blood pressure 142/71, saturation was 96% on room air.  Physical exam was unremarkable except the patient had mild left lower quadrant abdominal discomfort or pain on palpation.  The patient's EKG showed sinus tachy at 124 beats per minute.  No significant new findings as compared to prior EKG done in the past.  The patient's lab data done on day of admission, 06/27/2012, revealed BUN and creatinine of 21 and 1.37, glucose 129, otherwise BMP was unremarkable.  The patient's estimated GFR for African American would be 57.  Magnesium level was low at 1.2.  Liver enzymes were normal.  Cardiac enzymes, first set showed normal troponin at 0.05.  Second set troponin was elevated at 4.8.  CK MB fraction was 4.8 as well.  CK total was 223.  Third set revealed troponin level of 3.08.  The patient's CBC, white blood cell count was 11.5, hemoglobin was 11.2 and platelet count was 103.  Blood cultures taken on 06/27/2012, one culture revealed Escherichia coli, sensitive to all antibiotics.  The other culture showed Pseudomonas aeruginosa, sensitive to all antibiotics as well.  Urine cultures, clean catch, showed mixed bacterial organisms.  A repeated urine culture done on 06/28/2012, showed an identified organism 2,000 colony forming units.  The patient was admitted to the hospital when he was started on antibiotic.  Consultation with Dr. Saralyn Pilar was obtained.  Dr. Saralyn Pilar felt saw the patient in consultation on 06/28/2012.  He felt that patient has elevated troponin in the setting of sepsis with fever as well as rigors suggesting demand supply ischemia in the absence of chest pain or shortness of breath.  He recommended to continue Lovenox and hold beta-blockers for heart rate around 60.  Review echocardiogram and consider ID consultation and defer cardiac catheterization in the absence of chest pains, ongoing bacteremia.  Consultation with Dr. Vira Agar was obtained due to rectal bleeding.  Dr. 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  If his bleeding recurs he recommended to get bleeding scan.  Consultation with Dr. Clayborn Bigness was obtained as well.  Dr. Clayborn Bigness felt that patient had sepsis, E. Coli as well as pseudomonas and he recommended to continue antibiotic therapy with ciprofloxacin for 14 days.  Etiology of the sepsis remains unclear.  The patient was initiated on ciprofloxacin and he is to continue antibiotics for 13 more days after discharge from the hospital to complete a 14 day course.  Initially it was thought that patient's sepsis could have been related to pneumonia, which was noted on radiologic studies, possible pneumonia which is noted on the radiologic studies, however upon further evaluation, sputum cultures failed to reveal bacteria such as Escherichia coli or Pseudomonas.  Sputum cultures showed normal flora.  The patient's CT scan of abdomen and pelvis also did not show any evidence of infection.  The patient's white blood cell count returned back to normal.  The patient is to continue antibiotic therapy and follow up with his primary care physician for further recommendations.  In regards to questionable pneumonia, the patient had no symptomatology.  As mentioned above,  sputum cultures did not show any significant changes.  It was felt that it was likely not pneumonia, however it is recommended to follow patient's abnormal CT scan of chest results.  Next, the patient was noted to have elevated troponin.  He was followed by the cardiologist who felt that patient has no acute coronary syndrome, however likely demand ischemia.  No further studies were recommended by cardiology.  Echocardiogram was recommended and it was performed on 06/29/2012 revealing left ventricular ejection fraction by visual estimation is 50% to 55%, low-normal  global left ventricular systolic function, mild left ventricular hypertrophy, mildly dilated left atrium, mild mitral valve regurgitation, moderate to severe aortic valve stenosis was noted as well as moderately increased left ventricular posterior wall thickness and moderately elevated pulmonary arterial systolic pressure.  In regards to hypertension, the patient's blood pressure was very poorly controlled.  The patient's blood pressure medications were advanced to current levels and patient's blood pressure somewhat improved.    On the day of discharge, patient's vital signs, temperature 97.6, pulse 53 to 72, respiration rate was 16 to 18, blood pressure ranging from [id number] to Q000111Q systolic and 0000000 to Q000111Q diastolic and O2 sats were 96% to 98% on 1 liter of oxygen per nasal cannula and 93% on room air at rest.  It is recommended to follow patient's O2 sats and make decisions about changing his medications if needed including starting him on diuretics.    Next, in regards to diabetes mellitus, the patient's hemoglobin A1c was found to be 6.9.  Since the patient had acute on chronic renal failure metformin was discontinued and insulin Lantus was advanced.  It is recommended to follow the patient's blood glucose levels as outpatient and make decisions about advancement of his medications even further.  On the day of discharge however the patient's blood glucose levels are 117 fasting and 170s to 200s after meals.  In regards to acute on chronic renal failure, the patient presented with some renal insufficiency.  On the day of admission, the patient's creatinine was 1.37, however already on 06/28/2012 patient's creatinine was found to be 1.76.  It was felt to be due to elevated blood pressure readings.  The patient's creatinine improved by day of discharge to 1.53 on 07/01/2012.  It is recommended to follow the patient's creatinine levels as outpatient and make decisions about medications if needed.  Also recommended  to closely follow the patient's kidney function.  The patient is taken off metformin due to renal insufficiency.  Next, the patient was noted to have an episode of hypoxia requiring VQ evaluation.  The patient's VQ scan was low probability for pulmonary embolism.  It was felt to be possibly related to poor inspiratory effort, however it is recommended to follow patient's O2 sats as outpatient and make decisions about adding oxygen therapy for him as outpatient due to history of valvular heart disease.   Next, the patient was noted to have rectal bleeding.  It however resolved.  Hemoglobin level has remained stable.  On the day of discharge, the patient's hemoglobin level is stable at 9.1.  The patient is to continue iron supplementation and follow up with Dr. Gustavo Lah in the next few weeks after discharge to make decisions about colonoscopy as outpatient.  In regards to past medical history such as coronary artery disease, coronary artery bypass grafting, the patient is to hold aspirin therapy due to recent bleeding for approximately a week and then resume it depending on his primary care  physician's recommendations.  In regards to cardiomyopathy, the patient's cardiomyopathy seemed to be resolved.  His ejection fraction improved on the most recent echocardiogram done on this admission on 06/29/2012 revealing ejection fraction of 50% to 55%.  The patient is being discharged in stable condition with the above-mentioned medications and follow-up.   TIME SPENT:  40 minutes.   ____________________________ Theodoro Grist, MD rv:ea D: 07/02/2012 20:05:22 ET T: 07/02/2012 22:50:04 ET JOB#: [id number]  cc: Theodoro Grist, MD, <Dictator> Irven Easterly. Kary Kos, MD  Theodoro Grist MD ELECTRONICALLY SIGNED 07/23/2012 10:28

## 2014-05-23 NOTE — Discharge Summary (Signed)
PATIENT NAME:  Ryan Mcclure, Ryan Mcclure MR#:  [id number] DATE OF BIRTH:  1931/12/29  DATE OF ADMISSION:  02/21/2011 DATE OF DISCHARGE:  02/23/2011  NOTE: The patient will be transferred to [facility] for further care once a bed is available.    CONSULTING PHYSICIANS:  1. Efraim Kaufmann, MD from Palliative Care.  2. Mariane Duval, MD from Pulmonary. 3. Serafina Royals, MD and Bartholome Bill, MD from Cardiology.  4. Munsoor Holley Raring, MD from Nephrology.  5. Lavone Orn, MD  from Endocrine.   CURRENT DIAGNOSES:  1. Altered mental status, resolved, likely hypoxic encephalopathy.  2. Acute respiratory failure, likely multifactorial in the setting of acute systolic congestive heart failure with pulmonary edema and severe aortic stenosis.  3. Acute on chronic renal failure with a baseline creatinine in the mid 1.3 to 1.8.  4. Non ST-elevation myocardial infarction.  5. Low thyroid-stimulating hormone,  possibly sick euthyroid syndrome.  6. Shock, likely cardiogenic.  7. Diabetes.  8. History of cerebrovascular accident.  9. Chronic anemia.  10. Coronary artery disease, status post stenting to the left circumflex and left anterior descending with a recent catheterization in August 2012 with patent stents and significant right coronary artery lesion not amenable to percutaneous coronary intervention given it is a small caliber vessel.   CURRENT MEDICATIONS:  1. Lipitor 40 mg daily.  2. Ferrous sulfate 325 mg with meals. 3. Ativan 1 mg x1. 4. Protonix 40 mg daily.  5. Aggrenox 1 capsule b.i.d.  6. Heparin 5000 units subcutaneous every 8 hours. 7. Sliding scale insulin. 8. Nitroglycerin sublingual as needed.  9. Tylenol p.r.n.  10. Morphine p.r.n.  11. Nebulizers p.r.n.  12. Levophed 6 mcg.   SIGNIFICANT LABORATORY, DIAGNOSTIC AND RADIOLOGICAL DATA:  BNP on arrival 10,738.  Initial BUN 41 and creatinine 2.49. Peak creatinine 2.55. Today's, creatinine 2.13.  Initial troponin 0.04, peak troponin 0.83  yesterday afternoon, this morning's troponin 0.67.  CK-MB negative x3.  Initial WBC on arrival 11.6, hemoglobin 10.6, hematocrit. 33.6. Today's WBC 11.8 and hemoglobin 8.9. D-dimer on arrival 1.56.  Urinalysis not suggestive of infection. Urine eosinophils negative.  ANA with reflex: Slightly elevated anti-SS-B4 Sjogren's at 1.4, otherwise negative.  PTH elevated at 71.  Vitamin D 34.  CT of the head on arrival: No acute intracranial abnormality.  X-ray of the chest, portable, one view on arrival showing findings consistent with severe pulmonary edema and cardiomegaly. Pneumonia cannot be excluded.  Today's x-ray of the chest: Overall concerning for mild pulmonary edema. Interval improvement in the left mid lung consolidation and near complete resolution   HISTORY OF PRESENT ILLNESS: The patient is a 81 year old male with history of cerebrovascular accident, chronic kidney disease with baseline creatinine about 1.3 to 1.8, hypertension, peripheral vascular disease, severe AS with history of GI bleeds and AVMs, who presented from [facility] with respiratory distress and chest pressure. For full details, please see the History and Physical dictated on 02/21/2011 by Dr. Inez Catalina.  He was noted to be hypoxic on room air with 82% oxygen saturation. He was started on nonrebreather. He had some chest pressure at that time. He has been having his diuretics titrated as an outpatient by Dr. Nehemiah Massed, his primary cardiologist. Given some renal dysfunction, the dosages have been titrated downwards. He was requiring BiPAP on arrival with FiO2 of 100%. He was admitted to the Critical Care Unit as initial x-ray of the chest showed significant pulmonary edema in the setting of the patient's systolic congestive heart failure and history  of coronary artery disease. On arrival, he also had an elevated D-dimer; however, a CAT scan with PE protocol could not be performed given his renal dysfunction, acute on chronic. He was  given a dose of Bumex. Additionally, the patient was quite altered; however, he had a CT of the head which was negative for any acute intracranial events. He was transferred to the Critical Care Unit.   HOSPITAL COURSE:  Congestive heart failure: In regards to his congestive heart failure, the patient was started on Lasix. Cardiology was consulted. The patient had also been started on dopamine, and his outpatient blood pressure medications were held. His BNP was elevated at 10,738, and he was diuresed with Lasix. Pulmonary was consulted as well has Nephrology, given his initial decreased urine output, renal failure, respiratory failure requiring 100% FiO2. Although he had elevated D-dimer, his wall score was not significantly elevated on arrival. He was continued on BiPAP and then transitioned to high-flow nasal cannula and responded well to Lasix. He had required 80 mg IV daily for the last few days and has had about 4 liters of diuresis with improvement of his x-ray of the chest and renal function. He was transiently taken off of pressors and dopamine; however, he was switched to Levophed after he developed some tachycardia. His ACE inhibitor was also held given his hypotension. The patient has been having off and on chest pain with increase in his troponins from arrival to a peak of 0.8 last night. His chest pain has at times responded to nitroglycerin; however, he has showed significant EKG changes, namely ST depressions in inferolateral leads with some ST elevations on this morning's EKG  in V1 to V4. His last catheterization was in August of 2012 with moderate LV dysfunction and patent circumflex and LAD stents with about 70% stenosis in the small RCA lesion not amenable to PCI.Marland Kitchen  An echocardiogram was repeated here which shows ejection fraction of 35 to 45% with evidence for moderate-to-severe  aortic stenosis and some diastolic dysfunction. He has episodic drop in his blood pressure with nitroglycerin  sublingual and was placed back on pressors this morning after his pressures dropped with sublingual nitroglycerin. Currently, he is chest pain free; however, given his significant congestive heart failure and aortic stenosis he will be transferred to [facility] for further evaluation and management. The patient is in need of consideration for surgical intervention of his aortic valve versus continued medical therapy in a high-risk patient with multiple comorbidities, and with the lack of proper medical staff in this hospital he will be transferred to [facility].   Of note, his TSH was slightly low with slightly elevated free thyroxine, and the patient was seen by an endocrinologist, Dr. Gabriel Carina. This is thought to be possibly sick euthyroid syndrome versus silent thyroiditis. Total T3 and thyrotropin receptor antibodies could be checked, and repeat full thyroid function tests in three days as recommended.    CODE STATUS:  The patient is a FULL CODE.       TOTAL TIME SPENT:   35 minutes.  ____________________________ Vivien Presto, MD sa:cbb D: 02/23/2011 16:42:58 ET T: 02/23/2011 17:30:07 ET JOB#: [id number]  cc: Vivien Presto, MD, <Dictator> Vivien Presto MD ELECTRONICALLY SIGNED 02/27/2011 18:41

## 2014-05-23 NOTE — Consult Note (Signed)
PATIENT NAME:  Ryan Mcclure, Ryan Mcclure MR#:  [id number] DATE OF BIRTH:  1931-11-10  DATE OF CONSULTATION:  02/21/2011  REFERRING PHYSICIAN:  Vivien Presto, MD CONSULTING PHYSICIAN:  Sadie Pickar Lilian Kapur, MD  REASON FOR CONSULTATION: Acute renal failure in the setting of chronic kidney disease, stage III.   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 At the time of admission, he was significantly altered and was unable to offer history; however, he appears to be more awake and alert at present. He reports that he developed progressive shortness of breath while at [facility]. He also had some chest pressure while there. The patient was found to have significant pulmonary edema on chest x-ray and he was admitted for acute respiratory failure in the setting of pulmonary edema and systolic heart failure. We are now consulted for the evaluation and management of acute renal failure. The patient's creatinine is now up to 2.55. His baseline creatinine appears to be between 1.5 to 1.8. He reports that he has not seen a nephrologist in the past. He is followed by Dr. Kary Kos at the nursing home. Dr. Vivien Presto called me earlier in the day and I suggested that a higher dose of Lasix be given, 80 mg IV x1, in an effort to further treat his pulmonary edema  as he was requiring significant amounts of oxygen. He currently on high flow nasal cannula. A central line was placed earlier today by Dr. Flora Lipps.   PAST MEDICAL HISTORY:  1. Hypertension.  2. Chronic kidney disease, stage III.  3. History of right medial occipital lobe CVA.  4. Peripheral vascular disease status post CEA.  5. Coronary artery disease status post LAD and circumflex stent, placed at [facility]. Diabetes mellitus.  6. Hyperlipidemia.  7. Severe aortic stenosis.  8. Iron deficiency anemia.  9. History of gastrointestinal bleed in May 2011 requiring inferior mesenteric artery microembolization.  10. Diverticulosis.  11. 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  12. Erosive gastropathy.   PAST SURGICAL HISTORY:  1. Bilateral CEA. 2. Multiple cardiac catheterizations.  3. PEG tube placement in April 2011 with removal in May 2011  CURRENT INPATIENT MEDICATIONS: 1. Dopamine drip.  2. Tylenol 650 mg p.o. every four hours p.r.n.  3. Norco 5/325 mg 1 to 2 tablets p.o. every four hours p.r.n.  4. Aggrenox 1 capsule p.o. twice a day. 5. Lipitor 40 mg daily.  6. Ferrous sulfate 325 mg p.o. daily.  7. Heparin 5000 units subcutaneous every eight hours.  8. Sliding scale insulin.  9. Imdur 30 mg p.o. daily.  10. Morphine 1 to 2 mg IV every six hours p.r.n.  11. Nitroglycerin patch 0.2 mg topical daily.  12. Zofran 4 mg IV every four hours p.r.n.  13. Protonix 40 mg IV every 6 hours a.m.   SOCIAL HISTORY: The patient currently resides at [facility]. The patient has history of former tobacco abuse. No reported alcohol or illicit drug  use.   FAMILY HISTORY: The patient's mother died in her 17s secondary to diabetes and renal failure. The patient's sister had history of myocardial infarction and coronary artery bypass graft.   REVIEW OF SYSTEMS: CONSTITUTIONAL: Currently denies fevers and chills, but does have some malaise. EYES:  Denies diplopia. Does have history of left-sided hemianopsia. HEENT: Denies headaches, hearing loss, tinnitus, epistaxis, or sore throat. PULMONARY: Does report shortness of breath and dyspnea with exertion. Denies cough. CARDIOVASCULAR: Reported chest pain earlier but is chest pain free at present. Denies palpitations. GI: Denies nausea, vomiting, or bloody stools. GU: Denies frequency, urgency, or dysuria.  NEUROLOGIC: Denies focal extremity numbness, weakness or tingling. MUSCULOSKELETAL: Denies joint pain, swelling, or redness. INTEGUMENTARY: Denies skin rashes or lesions. ENDOCRINE: Denies polyuria, polydipsia, or polyphagia. HEMATOLOGIC/LYMPHATIC: Denies easy bruisability, bleeding, or swollen lymph nodes. ALLERGY/HEMATOLOGIC: Denies seasonal allergies or history of immunodeficiency. PSYCHIATRIC: Denies depression or bipolar disorder.   PHYSICAL EXAMINATION:   VITAL SIGNS: Temperature is 97.8, pulse 70, respirations 21, blood pressure 90/43, and pulse oximetry 100% on 75% high-flow nasal cannula. Blood glucose was 261.   GENERAL: Well-developed, well-nourished African American male who is in slight respiratory distress.   HEENT: Normocephalic, atraumatic. Extraocular movements are intact. Pupils are equal, round, and reactive to light. No scleral icterus. Conjunctivae are pink. No epistaxis noted. Gross hearing intact. Oral mucosa moist. High flow nasal cannula in place.   NECK: Supple without JVD or lymphadenopathy.   LUNGS: Bibasilar rales with slightly increased work of breathing.   HEART: S1 and S2, loud 3/6 systolic ejection murmur heard best in the right upper sternal border.   ABDOMEN: Soft, nontender, and nondistended. Bowel sounds positive. No rebound or guarding. No gross organomegaly appreciated.   EXTREMITIES: No clubbing or cyanosis noted. 1+ bilateral pitting edema noted.   NEUROLOGIC: The patient is alert and oriented to self and place, but is off on the time.  SKIN: Warm  and dry. No rashes are noted.   MUSCULOSKELETAL: Joint redness, swelling or tenderness appreciated.   PSYCHIATRIC: The patient has an appropriate affect and appears to have some insight into his current illness.   LABS/STUDIES: Sodium 142, potassium 4.2, chloride 103, CO2 29, BUN 53, creatinine 2.55, glucose 192. EGFR 32. LFTs are unremarkable. Troponin is 0.41. CBC shows WBC 11.6, hemoglobin 10.6, hematocrit 33.6, and platelets 170. D-dimer 1.56.   Urinalysis is negative for protein. No RBCs were noted.   ABG shows pH 7.33, pCO2 50, pO2 62, and FiO2 100%.  Chest x-ray performed today shows increased interstitial markings.   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  1. Acute renal failure: The patient's baseline creatinine has been fluctuating in the past, but it does appear he does have underlying chronic kidney disease, stage III. His baseline has varied from 1.3 to 1.8. Creatinine is now up to 2.55. He likely has acute tubular necrosis as a cause of his acute renal failure, likely due to diminished renal perfusion from his underlying congestive heart failure. However, he  is clearly in need of diuresis at this point in time. We will obtain renal ultrasound for further work-up. For now continue on Lasix. No indication for dialysis at this point in time; however, this is a  possibility if his renal function were to worsen with diuretic therapy.  2. Chronic kidney disease, stage III: The patient's creatinine baseline is between 1.3 to 1.8. He has not seen a nephrologist in the past. We will obtain renal ultrasound, SPEP, UPEP, and ANA for further workup. He does not appear to have any proteinuria at this point in time. 3. Respiratory failure: Most likely due to underlying systolic heart failure. He previously had a 2-D echocardiogram performed which showed normal ejection fraction, however, we would recommend obtaining another 2-D echocardiogram. Continue IV diuretic therapy. He was given Lasix 80 mg IV earlier today. We will continue to monitor urine output for now.  4. Anemia, not otherwise specified: Hemoglobin is noted to be 10.6. We will check SPEP, UPEP, and serum iron studies.   I would like to thank Dr. Vivien Presto for this kind referral. Further plan as the patient progresses.  ____________________________ Tama High, MD mnl:slb D: 02/21/2011 15:53:00 ET T: 02/21/2011 16:11:35 ET JOB#: [id number]  cc: Tama High, MD, <Dictator> Mariah Milling Noraa Pickeral MD ELECTRONICALLY SIGNED 03/09/2011 20:45

## 2014-05-23 NOTE — Consult Note (Signed)
Chief Complaint and History:   Referring Physician Dr. Bridgette Habermann    Chief Complaint abnormal thyroid function tests    History of Present Illness This is a 81 year old male with a history of CAD, valvular heart disease, CKD, diabetes, PVD s/p CVA, who was admitted yesterday with respiratory distress attributed to flud overload in setting of acute renal failure and CHF exacerbation. He has had been treated with diuretics, hypotension has required pressor support, and hypoxia has required high flow O2. Labs showed a low TSH of 0.109 uIU/ml and a mildly elevated free T4 of 1.48 ng/dl. He has no history of thyroid disease. He denies neck pain or recent fevers. There has been no exposure to contrast dye or steroids. He denies tremor or heat intolerance.    Past History PAST MEDICAL HISTORY:  1. Hypertension.  2. Diabetic nephropathy with chronic kidney disease, stage III.  3. History of right medial occipital lobe CVA, 2011 4. Peripheral vascular disease status post CEA.  5. Coronary artery disease.  6. Hyperlipidemia.  7. Severe aortic stenosis.  8. Iron deficiency anemia.  9. History of gastrointestinal bleed in May 2011 requiring inferior mesenteric artery microembolization.  10. Diverticulosis.  11. 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  12. Erosive gastropathy.  13.          Diabetes mellitus  PAST SURGICAL HISTORY:  1. Bilateral CEA. 2. Multiple cardiac catheterizations.  3. PEG tube placement in April 2011 with removal in May 2011  SOCIAL HISTORY: The patient currently resides at [facility]. The patient has history of former tobacco abuse. No alcohol or illicit drug use.   FAMILY HISTORY: No known history of thyroid disease.   Allergies:  Steroids: Swelling  Home Medications:  Lantus 20 units [place] :   once a day at bedtime, Active  carvedilol 25 mg oral tablet: 1  orally 2 times a day , Active  amlodipine 5 mg oral tablet: 1  orally 2 times a day ,  Active  nitroglycerin 0.4 mg sublingual tablet: 1  sublingual  as needed  , Active  hydrALAZINE 50 mg oral tablet: 1  orally every 8 hours , Active  Aggrenox 25 mg-200 mg oral capsule, extended release: 1 cap(s) orally 2 times a day, Active  ferrous sulfate 325 mg (65 mg elemental iron) oral tablet: 1 tab(s) orally 3 times a day, Active  hydrochlorothiazide-lisinopril 25 mg-20 mg oral tablet: 1 tab(s) orally once a day, Active  omeprazole 40 mg oral delayed release capsule: cap(s) orally 2 times a day, Active  breeze meter:   , Active  Lipitor 40 mg oral tablet: 1 tab(s) orally once a day (at bedtime), Active  Humalog 100 units/mL subcutaneous solution:  subcutaneous once a day according to a.m. sliding scale, Active  Review of Systems:   Review of Systems   GEN: No fevers or weight loss. CARDS: No chest pain or palpitations. PULM: He has SOB and cough. ABD: No N/V/abdominal pain. Appetite is poor. GU: No dysuria or hematuris. EXT: No edema. SKIN: No rash or pruritus. NEURO: No tremor or falls. MSK: He complains of diffuse weakness. No myalgias.   Physical Exam:   General WD WN AAM in NAD    Skin No rash    Eyes EOMI, no periorbital edema or proptosis    ENMT OP clear    Head and Neck [place]/AT, no thyromegaly, no palpable thyroid nodules    Respiratory and Thorax Decreased BS throughout  Cardiovascular RRR, holosystolic murmur    Gastrointestinal Soft, NT/ND    Genitourinary Foley catheter in place    Musculoskeletal Moves all extremities, nl motor tone    Neurological No tremor of outstretched hands    Psychiatric A&O x3    Lymphatics No submandibular/anterior cervical or posterior cervical LAD   VITAL SIGNS/ANCILLARY NOTES: **Vital Signs.:   24-Jan-13 18:00   Vital Signs Type Routine   Temperature Source axillary   Pulse Pulse 68   Respirations Respirations 17   Systolic BP Systolic BP 142   Diastolic BP (mmHg) Diastolic BP (mmHg) 48   Mean BP 67   Pulse Ox %  Pulse Ox % 100   Oxygen Delivery 55% HFNC   Pulse Ox Heart Rate 70   Laboratory Results: Thyroid:  24-Jan-13 03:25    Thyroxine, Free   1.48   Thyroid Stimulating Hormone   0.109  Routine Chem:  24-Jan-13 03:25    Glucose, Serum   192   BUN   52   Creatinine (comp)   2.43   Sodium, Serum 143   Potassium, Serum 3.9   Chloride, Serum 102   CO2, Serum 28   Calcium (Total), Serum 8.6   Anion Gap 13   Osmolality (calc) 304   eGFR (African American)   33   eGFR (Non-African American)   28   Magnesium, Serum   1.7   Hemoglobin A1c ([facility])   7.4   Cholesterol, Serum 86   Triglycerides, Serum 75   HDL (INHOUSE) 45   VLDL Cholesterol Calculated 15   LDL Cholesterol Calculated 26  Routine Coag:  24-Jan-13 03:25    Prothrombin   16.6   INR 1.3  Routine Hem:  24-Jan-13 03:25    WBC (CBC)   14.6   RBC (CBC)   3.34   Hemoglobin (CBC)   9.0   Hematocrit (CBC)   28.2   Platelet Count (CBC)   127   MCV 84   MCH 26.8   MCHC   31.8   RDW   15.3   Neutrophil % 85.6   Lymphocyte % 5.2   Monocyte % 9.0   Eosinophil % 0.0   Basophil % 0.2   Neutrophil #   12.5   Lymphocyte #   0.8   Monocyte #   1.3   Eosinophil # 0.0   Basophil # 0.0   Assessment/Plan:   Orders/Results reviewed Orders reviewed and updated, Laboratory results reviewed, Radiology results reviewed, Vital Signs reviewed, Nursing documentation reviewed    Assessment/Plan 81 yo M with no history of thyroid disease admitted with resporatory distress due to pulmonary edema in setting of CHF exacerbation and acute renal failure found to have incidentally low TSH and mildly elevated free T4 consistent with hyperthyroidism. He has no signs/symptoms of hyperthyroidism.  He likely has silent thyroiditis due to acute illness, which is a result of the illness. I plan to check a total T3 and thyrotropin receptor anitbody. As he is aymptomatic from hyperthyroidism, I plan to repeat thyroid TSH and free T3 in 3 days and will  reassess at that time.   Thank you for the kind result for consultation.   Electronic Signatures: Judi Cong (MD)  (Signed 24-Jan-13 21:24)  Authored: Chief Complaint and History, ALLERGIES, HOME MEDICATIONS, Review of Systems, Physcial Exam, Vital Signs, LABS, Assessment/Plan   Last Updated: 24-Jan-13 21:24 by Judi Cong (MD)

## 2014-05-23 NOTE — H&P (Signed)
PATIENT NAME:  Ryan Mcclure, Ryan Mcclure MR#:  [id number] DATE OF BIRTH:  1931-11-13  DATE OF ADMISSION:  02/21/2011  REFERRING PHYSICIAN: Dr. Payton Doughty PRIMARY CARE PHYSICIAN: Dr. Kary Kos  PRESENTING COMPLAINT: Respiratory distress.   HISTORY OF PRESENT ILLNESS: Ryan Mcclure is a 81 year old gentleman history of CVA, chronic kidney disease, hypertension, peripheral vascular disease, coronary artery disease, diabetes, dyslipidemia, GI bleed who presents from [facility] with reports of respiratory distress and chest pressure. Patient is completely altered, is only moaning, unable to provide history. According to documentation patient presented via EMS. Patient was receiving care at the respite portion of [facility]. He was noted to be hypoxic at 82% on room air. He arrived via nonrebreather to the ED. Apparently was complaining of chest pressure, received nitroglycerin sublingual with relief. He then receive an additional nitroglycerin sublingual en route and third one when he arrived here. There were no reports of altered mentation, however, I have recollection of caring for this gentleman in October 2011 and he was alert and oriented at that time when he was admitted for acute versus subacute cerebrovascular accident. In review of [facility] records, patient was seen on 02/02/2011 by Dr. Nehemiah Massed and was evaluated for fluid overload at that time. He had a follow up appointment on 02/09/2011. In the assessment and plan due to worsening renal function there were adjustments made with decreased dose of his Lasix regimen. Based on his [facility] he is on a regimen of 40 mg on Tuesday and Wednesday starting 02/20/2011 then decreased to 20 mg daily thereafter and I am assuming that is starting next week.   REVIEW OF SYSTEMS: Patient is currently not able to provide review of systems.   PAST MEDICAL HISTORY:  1. Admitted 10/08 to 11/06/2009 with acute versus subacute right medial occipital lobe CVA with visual deficit of  peripheral vision and left-sided hemianopsia.  2. Chronic kidney disease with baseline creatinine around 1.7.  3. Hypertension.  4. Peripheral vascular disease status post bilateral CEA.  5. Coronary artery disease status post LAD and circumflex stent placed at [facility] with repeat PCI of the LAD at 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 His last catheterization was done by Dr. Nehemiah Massed in August 2012 with patent stent of the left circumflex LAD. He had significant small vessel stenosis of the RCA not amenable to PCI, noted mild to moderate pulmonary hypertension and moderate segmental LV dysfunction.  6. Diabetes.  7. Dyslipidemia.  8. Severe aortic stenosis.  9. Iron deficiency anemia.  10. History of GI bleed with admission in May 2011 requiring inferior mesenteric artery microembolization and blood transfusions.  11. Diverticulosis.  12. History of AVMs in the small intestine noted on capsule endoscopy.  13. Erosive gastropathy noted on upper GI endoscopy in 2009.   PAST SURGICAL HISTORY:  1. Bilateral CEA.  2. Cardiac catheterizations.  3. PEG tube placement April 2011 with reversal May 2011.   ALLERGIES: Steroids.   MEDICATIONS:  1. Lantus 20 units subcutaneous at bedtime.  2. Sliding scale insulin Humalog daily.  3. Furosemide 40 mg Tuesday and Wednesday starting 02/20/2011 followed by 20 mg daily.  4. Hydrocodone 5/500, 1 tablet at bedtime as needed.  5. Tylenol 500 mg 1 to 2 tablets every 4 to 6 hours as needed.  6. Aggrenox 25/200 mg capsule extended release b.i.d.  7. Amlodipine 5 mg daily.  8. Carvedilol 25 mg b.i.d.  9.  Ferrous sulfate 325 mg daily.  10. Hydralazine 50 mg t.i.d.  11. Hydrochlorothiazide/lisinopril 25 mg/20 mg daily.  12. Lipitor 40 mg daily.  13. Omeprazole 40 mg daily.  14. Nitroglycerin sublingual as needed.  15. Imdur 30 mg daily.    FAMILY HISTORY: From previous records. Mother died in her 35s secondary to diabetes and renal failure. Sister had myocardial infarction and bypass surgery.   SOCIAL HISTORY: He is a former tobacco user. Patient is living at [facility] and was brought in by EMS from the respite portion of [place].   REVIEW OF SYSTEMS: Unable to obtain, please see history of present illness.   PHYSICAL EXAMINATION:  VITAL SIGNS: Pulse 82, respiratory rate 32, blood pressure 150/72, sating at 95% on BiPAP.   GENERAL: Patient is lying in bed critically ill appearing.   HEENT: Normocephalic, atraumatic. Pupils are equal, symmetric. He has BiPAP in place. He is moaning, not answering questions.   NECK: Soft and supple. No adenopathy. Has elevated JVP to his ear.   CARDIOVASCULAR: Tachycardic. No murmurs, rubs, or gallops.   LUNGS: Crackles and noted wheezing. No use of accessory muscles. He has increased respiratory rate.   ABDOMEN: Soft. Positive bowel sounds. Slightly distended. No mass appreciated.   EXTREMITIES: 2 to 3+ pitting edema. Dorsal pedis pulses faint.   MUSCULOSKELETAL: No joint effusion.   SKIN: No ulcers.   NEUROLOGIC: Patient not cooperating with neurological exam.   PSYCH: He is a moaning but not opening eyes or responding to questions.   LABORATORY, DIAGNOSTIC AND RADIOLOGICAL DATA: BNP 10,738, CK 134, MB 1.2, d-dimer 01.56, WBC 11.6, hemoglobin 10.6, hematocrit 33.6, platelets 170, MCV 85, glucose 223, BUN 41, creatinine 2.49, sodium 141, potassium 4.5, chloride 104, carbon dioxide 31, calcium 8.9. LFTs within normal limits. Troponin 0.04. EKG with sinus rate of 78. No ST changes. Has some PACs. Chest x-ray with pulmonary edema.   ASSESSMENT AND PLAN: Ryan Mcclure is a 81 year old gentleman with history of CVA, hypertension, chronic kidney disease, peripheral vascular disease status post bilateral CEA, coronary artery disease status post stents, diabetes, dyslipidemia, chronic  anemia, GI bleed presenting from [facility] with acute onset of chest pain and respiratory distress.  1. Acute respiratory failure in the setting of pulmonary edema and systolic dysfunction congestive heart failure versus acute coronary syndrome. Was getting treated outpatient for fluid overload and congestive heart failure recently. His BNP is elevated and d-dimer was ordered and is also elevated likely in the setting of his congestive heart failure but will get a V/Q scan to further evaluate. Continue on BiPAP. Initiate Bumex. Follow ins and outs, daily weight, daily creatinine. Start on nitro patch, Coreg, hydralazine. Hold his lisinopril in light of worsening renal function. Cycle cardiac enzymes. Get echocardiogram. Last catheterization results as dictated above. Will obtain a cardiology consultation and send TSH, fasting lipid panel, A1c and magnesium level.  2. Acute on chronic renal failure. He recently had decreased dose of his Lasix. Will follow his creatinine closely on Bumex. Again holding lisinopril and will also hold HCTZ.  3. Altered mental status. Patient is not altered at baseline based on review of records. Questionable hypoxic encephalopathy. He does, however, have history of known cerebrovascular accident. No reports of drop in blood pressure or arrhythmias prior to arrival. Will continue neuro checks, get CT of the brain.  4. Diabetes. Resume his Lantus. May need to decrease if his blood sugars are running low if patient is not taking p.o. Continue sliding scale  insulin.  5. Hypertension. Restart Coreg, Norvasc, hydralazine as above. Initiate on nitro patch. Hold lisinopril and hydrochlorothiazide.  6. Chronic anemia. Hemoglobin and hematocrit are stable.  7. Prophylaxis. Initiate on heparin subcutaneous and Protonix. He is on Aggrenox.   TIME SPENT: Approximately 55 minutes on critical care patient.    ____________________________ Rita Ohara, MD ap:cms D: 02/21/2011  01:08:31 ET T: 02/21/2011 06:27:25 ET JOB#: [id number] cc: Brien Few Deakin Lacek, MD, <Dictator> Irven Easterly. Kary Kos, MD Rita Ohara MD ELECTRONICALLY SIGNED 03/27/2011 0:31

## 2014-05-23 NOTE — Consult Note (Signed)
Present Illness 81 year old male with known coronary artery disease, status post PCI and stent placement of circumflex and left anterior descending artery in the past.  The patient had a significant myocardial infarction in 2010 with a cardiomyopathy.  The patient has diabetes mellitus, hypertension, hyperlipidemia, and previous stroke.  This is all been medically managed with appropriate medications and recent adjustments with additional isosorbide due to the patient's shortness of breath and weakness as well as some chest pressure.  The patient had a cardiac catheterization in August of 2012 showing small vessel coronary artery disease and patent stents.  He was medically managed at this time.  Echocardiogram still shows moderate to severe LV systolic dysfunction.  He now comes in with severe shortness of breath and acute on chronic congestive heart failure with elevated BNP.  This is multifactorial in nature including renal insufficiency and anemia.  He did have an elevated troponin of 0.14, more consistent with hypoxia rather than acute myocardial infarction.  The patient has been on 100% nonrebreather BiPAP and is currently stable at this time  Family history No family members with early onset of cardiovascular disease   Social history patient currently denies alcohol or tobacco use   Physical Exam:   GEN WD    HEENT pink conjunctivae    NECK supple    RESP wheezing  crackles    CARD Regular rate and rhythm  Murmur    Murmur Systolic    Systolic Murmur axilla    ABD denies tenderness  soft    LYMPH negative neck    EXTR negative cyanosis/clubbing    SKIN No rashes    NEURO cranial nerves intact    PSYCH alert   Review of Systems:   Subjective/Chief Complaint I am short of breath    Respiratory: Short of breath    Review of Systems: All other systems were reviewed and found to be negative    Medications/Allergies Reviewed Medications/Allergies reviewed     MI:     Gastritis: by EGD in 9/09   Anemia: Iron Deficient Anemia   HTN:    NIDDM:    Peripheral Vascular Disease:    Cardiac Stents:    right carotid: 2008  Home Medications:  Lantus 20 units [place] :   once a day at bedtime, Active  carvedilol 25 mg oral tablet: 1  orally 2 times a day , Active  amlodipine 5 mg oral tablet: 1  orally 2 times a day , Active  nitroglycerin 0.4 mg sublingual tablet: 1  sublingual  as needed  , Active  hydrALAZINE 50 mg oral tablet: 1  orally every 8 hours , Active  Aggrenox 25 mg-200 mg oral capsule, extended release: 1 cap(s) orally 2 times a day, Active  ferrous sulfate 325 mg (65 mg elemental iron) oral tablet: 1 tab(s) orally 3 times a day, Active  hydrochlorothiazide-lisinopril 25 mg-20 mg oral tablet: 1 tab(s) orally once a day, Active  omeprazole 40 mg oral delayed release capsule: cap(s) orally 2 times a day, Active  breeze meter:   , Active  Lipitor 40 mg oral tablet: 1 tab(s) orally once a day (at bedtime), Active  Humalog 100 units/mL subcutaneous solution:  subcutaneous once a day according to a.m. sliding scale, Active  Cardiology:  22-Jan-13 22:07    Ventricular Rate 78   Atrial Rate 78   P-R Interval 180   QRS Duration 90   QT 370   QTc 421   P Axis 71  R Axis 4   T Axis -11  Routine Chem:  22-Jan-13 22:49    B-Type Natriuretic Peptide [facility]) 10738  Cardiac:  22-Jan-13 22:49    CK, Total 134   CPK-MB, Serum 1.2  Routine Coag:  22-Jan-13 22:49    D-Dimer, Quantitative 1.56  Routine Hem:  22-Jan-13 22:49    WBC (CBC) 11.6   RBC (CBC) 3.94   Hemoglobin (CBC) 10.6   Hematocrit (CBC) 33.6   Platelet Count (CBC) 170   MCV 85   MCH 26.9   MCHC 31.6   RDW 15.7  Routine Chem:  22-Jan-13 22:49    Glucose, Serum 223   BUN 41   Creatinine (comp) 2.49   Sodium, Serum 141   Potassium, Serum 4.5   Chloride, Serum 104   CO2, Serum 31   Calcium (Total), Serum 8.9  Hepatic:  22-Jan-13 22:49    Bilirubin, Total 0.5    Alkaline Phosphatase 75   SGPT (ALT) 25   SGOT (AST) 23   Total Protein, Serum 8.1   Albumin, Serum 3.6  Routine Chem:  22-Jan-13 22:49    Osmolality (calc) 298   eGFR (African American) 33   eGFR (Non-African American) 27   Anion Gap 6  Cardiac:  22-Jan-13 22:49    Troponin I 0.04  Routine UA:  23-Jan-13 03:11    Color (UA) Straw   Clarity (UA) Clear   Glucose (UA) Negative   Bilirubin (UA) Negative   Ketones (UA) Negative   Specific Gravity (UA) 1.006   Blood (UA) Negative   pH (UA) 5.0   Protein (UA) Negative   Nitrite (UA) Negative   Leukocyte Esterase (UA) Negative   WBC (UA) 1 /HPF   Mucous (UA) PRESENT   Hyaline Cast (UA) 17 /LPF  Cardiac:  23-Jan-13 08:22    CK, Total 113   CPK-MB, Serum 3.4   Troponin I 0.14  Lab:  23-Jan-13 09:15    pH (ABG) 7.33   PCO2 50   PO2 62   FiO2 100   Base Excess -0.2   HCO3 26.4   O2 Saturation 89.5   O2 Device BIPAP   Specimen Type (ABG) ARTERIAL   Patient Temp (ABG) 37.0   PSV 10   PEEP 5.0   Mechanical Rate 8    Steroids: Swelling  Vital Signs/Nurse's Notes: **Vital Signs.:   23-Jan-13 09:00   Vital Signs Type Routine   Temperature Temperature (F) 97.8   Celsius 36.5   Temperature Source axillary   Pulse Pulse 76   Respirations Respirations 43   Systolic BP Systolic BP 767   Diastolic BP (mmHg) Diastolic BP (mmHg) 50   Mean BP 70   Pulse Ox % Pulse Ox % 90   Oxygen Delivery Bi Pap  100%   Pulse Ox Heart Rate 60     Impression 81 year old male with known cardiovascular disease status post previous myocardial infarction and PCI and stent placement, stroke, diabetes mellitus, hypertension, hyperlipidemia, with acute on chronic congestive heart failure with demand ischemia and elevated troponin multifactorial in nature including LV systolic dysfunction, valvular heart disease , renal insufficiency and anemia    Plan 1.  Continue serial ECG and enzymes to assess for possible myocardial infarction  2.   Continue BiPAP and 100% oxygen to improve congestive heart failure. 3.  Lasix, watching closely for further renal insufficiency to improve acute pulmonary edema and congestive heart failure. 4.  Consider echocardiogram for LV systolic dysfunction, valvular heart disease changes  contributing to above. 5.  Consideration of cardiac catheterization only if significant symptoms were to occur with medication management listed above and improvement. of the anemia and renal insufficiency. 6.  Further investigation of other possible causes including pneumonia and/or bronchitis   Electronic Signatures: Corey Skains (MD)  (Signed 23-Jan-13 11:26)  Authored: General Aspect/Present Illness, History and Physical Exam, Review of System, Past Medical History, Home Medications, Labs, Allergies, Vital Signs/Nurse's Notes, Impression/Plan   Last Updated: 23-Jan-13 11:26 by Corey Skains (MD)

## 2014-05-23 NOTE — Discharge Summary (Signed)
PATIENT NAME:  Ryan Mcclure, Ryan Mcclure MR#:  [id number] DATE OF BIRTH:  1931/04/04  DATE OF ADMISSION:  02/21/2011 DATE OF DISCHARGE:  02/24/2011  ADDENDUM TO DISCHARGE SUMMARY OF 02/23/2011: Dictated by Dr. Bridgette Habermann.    DISPOSITION: The patient was transferred to [facility] on February 24, 2011. He continued to be on the Levophed drip but was otherwise stable. He had good saturation on high-flow nasal cannula at 55%. He was chest pain free. The patient was felt to have acute coronary syndrome/unstable angina and was recommended transfer to [facility] by the cardiologist, where he was transferred in a stable condition.   TIME SPENT:   45 minutes. ____________________________ Cherre Huger, MD sp:cbb D: 02/28/2011 19:30:32 ET T: 03/01/2011 12:26:54 ET JOB#: [id number]  cc: Cherre Huger, MD, <Dictator> Cherre Huger MD ELECTRONICALLY SIGNED 03/01/2011 16:45

## 2014-06-03 ENCOUNTER — Ambulatory Visit (INDEPENDENT_AMBULATORY_CARE_PROVIDER_SITE_OTHER): Payer: Medicare Other | Admitting: Podiatry

## 2014-06-03 DIAGNOSIS — B351 Tinea unguium: Secondary | ICD-10-CM | POA: Diagnosis not present

## 2014-06-03 DIAGNOSIS — M79673 Pain in unspecified foot: Secondary | ICD-10-CM

## 2014-06-03 NOTE — Progress Notes (Signed)
Patient ID: RAESEAN SEELEY, male   DOB: [birth year], 81 y.o.   MRN: [id number]  Subjective: Mr. Chopin presents the office today with complaints of painful elongated toenails which he is unable to trim himself. He denies any recent redness or drainage from the nail sites. No other complaints at this time. Denies any systemic complaints as fevers, chills, nausea, vomiting. Denies any recent ulcerations or any other problems.  Objective: AAO 3, NAD DP pulses palpable bilaterally, PT pulses nonpalpable. CRT less than 3 seconds Protective sensation intact with Derrel Nip Monofilament, Achilles tendon reflex intact Nails hypertrophic, dystrophic, brittle, discolored, elongated 10. There subjective tenderness upon palpation of the nails 1-5 bilaterally. No surrounding erythema or drainage from the nail sites. No open lesions or pre-ulcerative lesions are identified. No other areas of tenderness to bilateral lower extremities. No overlying edema, erythema, increase in warmth bilaterally No pain with calf compression, swelling, warmth, erythema.  Assessment: 81 year old male with symptomatic onychomycosis  Plan: -Nails sharply debrided 10 without complications/bleeding. -Discussed daily foot inspection. -Follow-up in 3 months or sooner if any problems are to arise.

## 2014-08-25 DIAGNOSIS — I10 Essential (primary) hypertension: Secondary | ICD-10-CM

## 2014-08-25 DIAGNOSIS — E1159 Type 2 diabetes mellitus with other circulatory complications: Secondary | ICD-10-CM | POA: Insufficient documentation

## 2014-08-25 DIAGNOSIS — I152 Hypertension secondary to endocrine disorders: Secondary | ICD-10-CM | POA: Insufficient documentation

## 2014-09-02 ENCOUNTER — Ambulatory Visit (INDEPENDENT_AMBULATORY_CARE_PROVIDER_SITE_OTHER): Payer: Medicare Other | Admitting: Podiatry

## 2014-09-02 DIAGNOSIS — B351 Tinea unguium: Secondary | ICD-10-CM

## 2014-09-02 DIAGNOSIS — M79673 Pain in unspecified foot: Secondary | ICD-10-CM

## 2014-09-02 NOTE — Progress Notes (Signed)
Patient ID: Ryan Mcclure, male   DOB: 09/06/1931, 81 y.o.   MRN: [id number]  Subjective: Ryan Mcclure presents the office today with complaints of painful elongated toenails which he is unable to trim himself. He denies any recent redness or drainage from the nail sites. No other complaints at this time. Denies any systemic complaints as fevers, chills, nausea, vomiting. Denies any recent ulcerations or any other problems.  Objective: AAO 3, NAD DP pulses palpable bilaterally, PT pulses nonpalpable. CRT less than 3 seconds Nails hypertrophic, dystrophic, brittle, discolored, elongated 10. There tenderness upon palpation of the nails 1-5 bilaterally. No surrounding erythema or drainage from the nail sites. No open lesions or pre-ulcerative lesions are identified. No other areas of tenderness to bilateral lower extremities. No overlying edema, erythema, increase in warmth bilaterally No pain with calf compression, swelling, warmth, erythema.  Assessment: 81 year old male with symptomatic onychomycosis  Plan: -Treatment options discussed including all alternatives, risks, and complications -Nails sharply debrided 10 without complications/bleeding. -Discussed daily foot inspection. -Follow-up in 3 months or sooner if any problems are to arise.  Celesta Gentile, DPM

## 2014-09-15 IMAGING — CT CT ABD-PELV W/O CM
1 of 2 series · 15 of 32 positions shown, 19 images · non-contrast
Comparison: none

REASON FOR EXAM: (1) fever / sepsis w/o a clear focus. LLQ tenderness,
eval for diverticulatis. h
COMMENTS:

PROCEDURE:     CT  - CT ABDOMEN AND PELVIS W[DATE]  [DATE]
RESULT:     CT abdomen pelvis dated 06/27/2012
TECHNIQUE: Helical noncontrasted 3 mm sections were obtained from the lung
bases through the pubic symphysis.

[Series 3: soft tissue · axial · 0.77mm/px · z∈[-833,-422]mm · 15 of 151 slices shown, 19 images]
[im 7/151  soft-tissue]
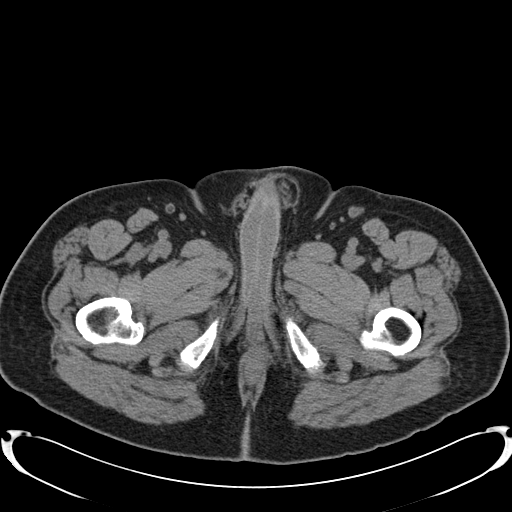
[im 7/151  bone]
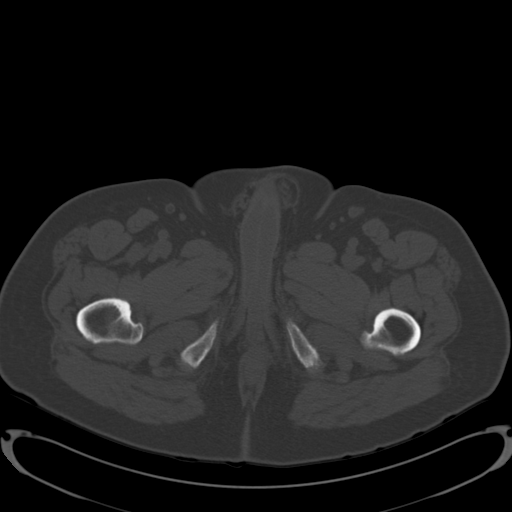
[im 20/151  soft-tissue]
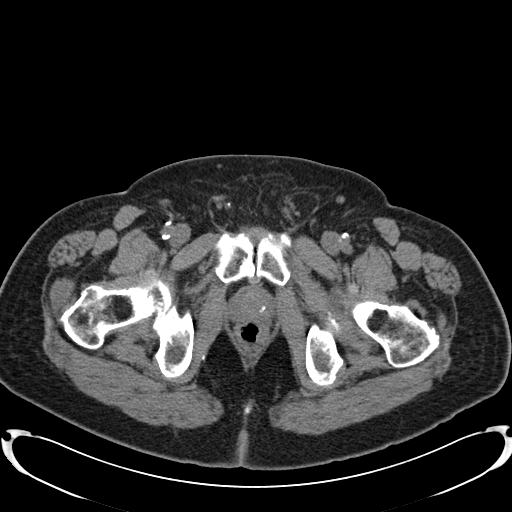
[im 33/151  soft-tissue]
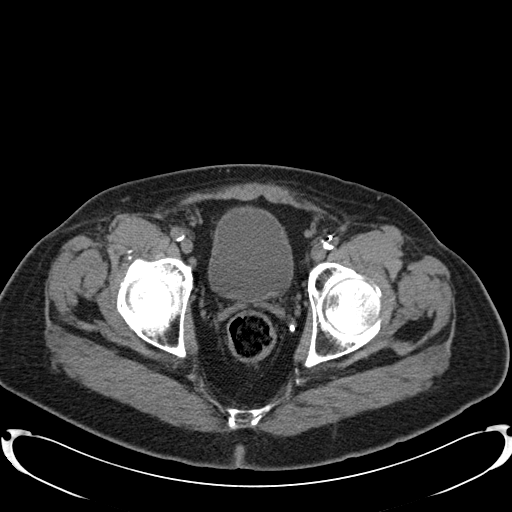
[im 40/151  soft-tissue]
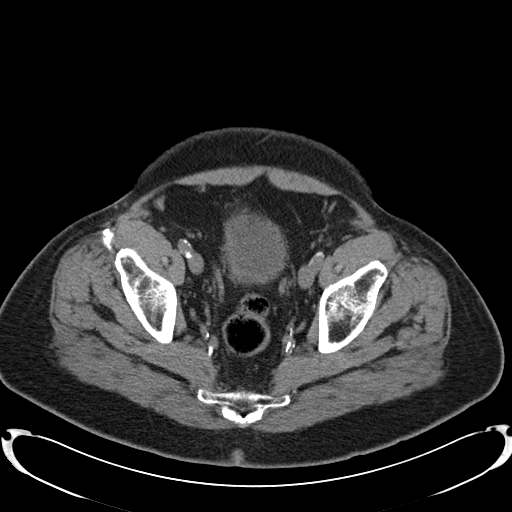
[im 53/151  soft-tissue]
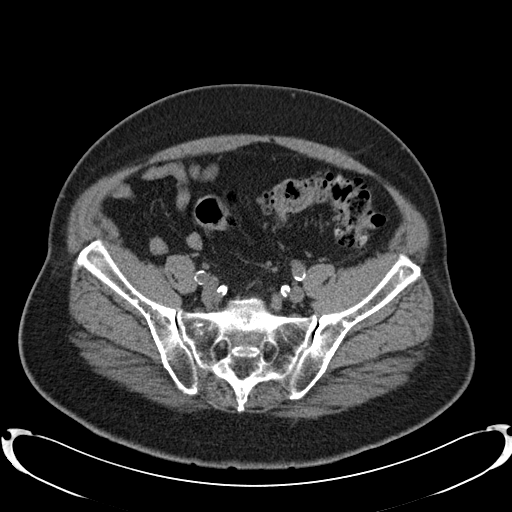
[im 66/151  soft-tissue]
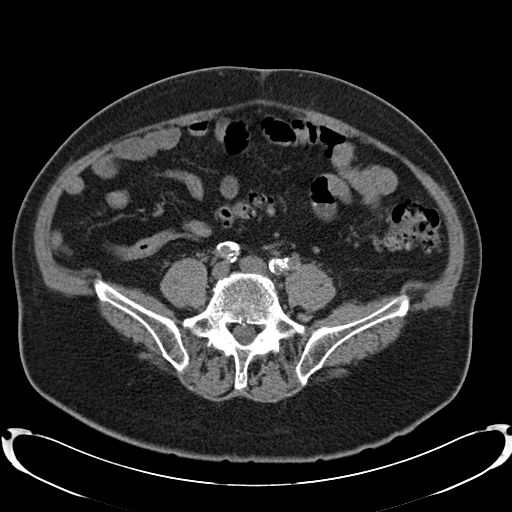
[im 79/151  soft-tissue]
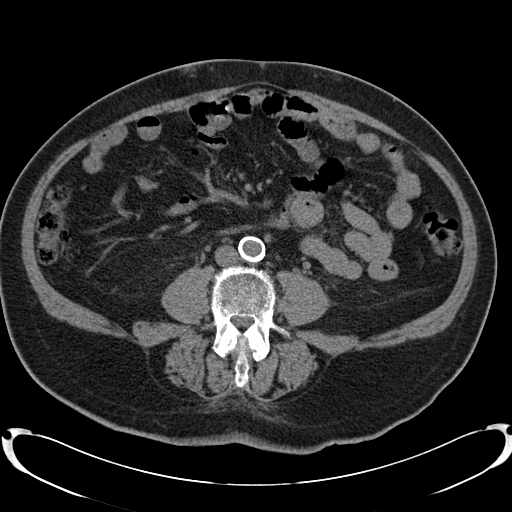
[im 85/151  soft-tissue]
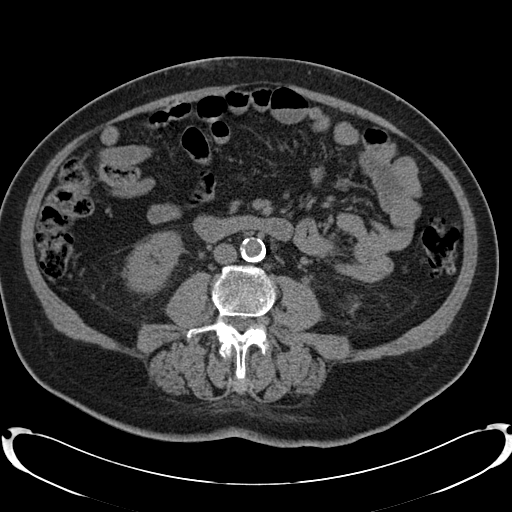
[im 98/151  soft-tissue]
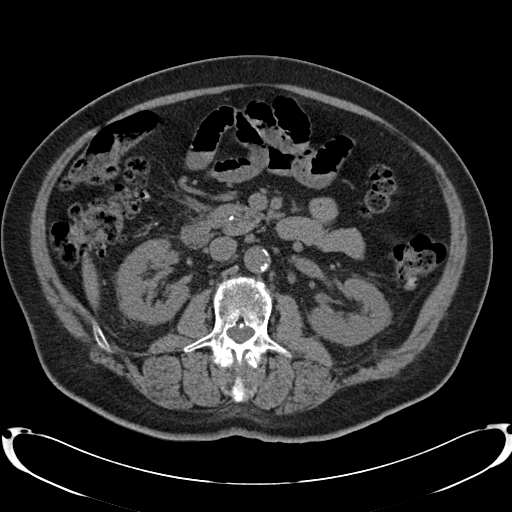
[im 98/151  bone]
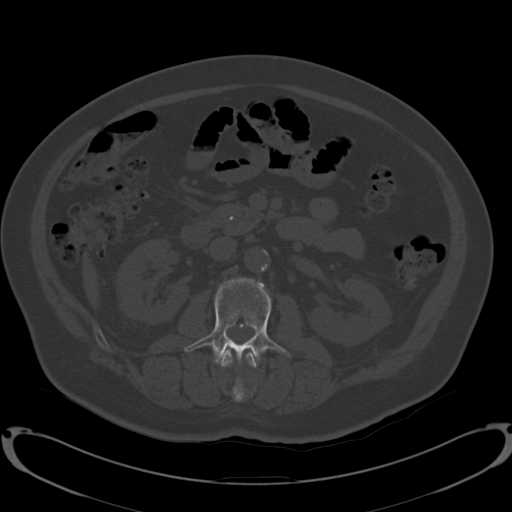
[im 111/151  soft-tissue]
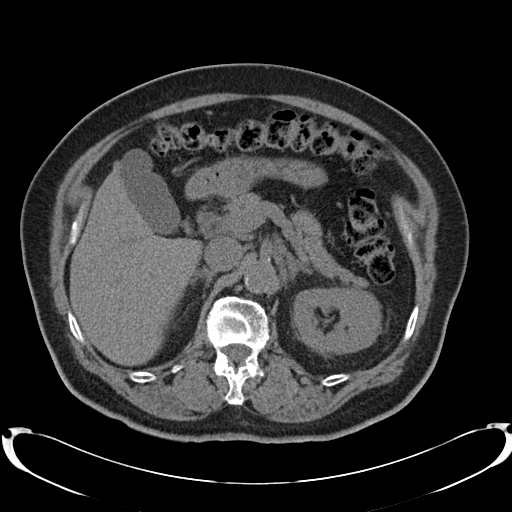
[im 118/151  soft-tissue]
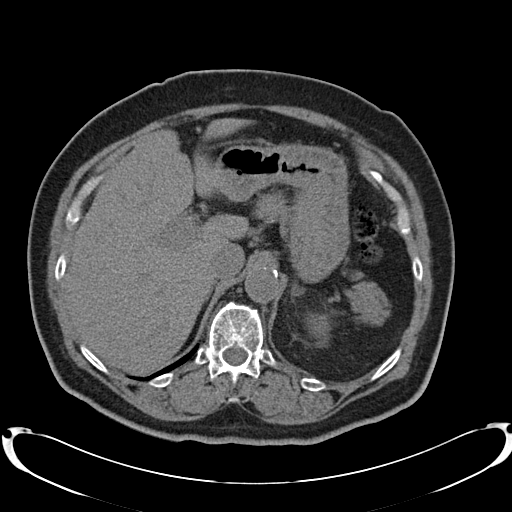
[im 124/151  lung]
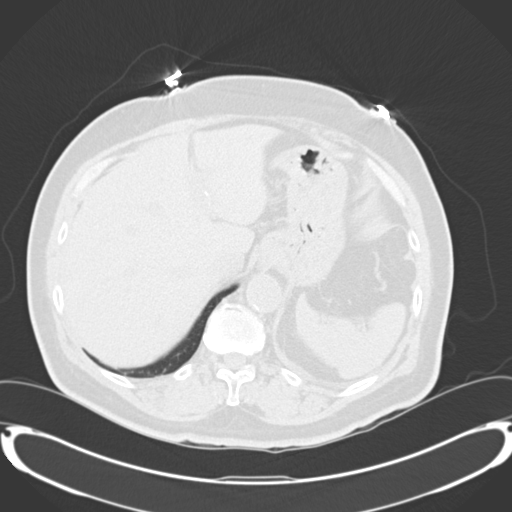
[im 131/151  soft-tissue]
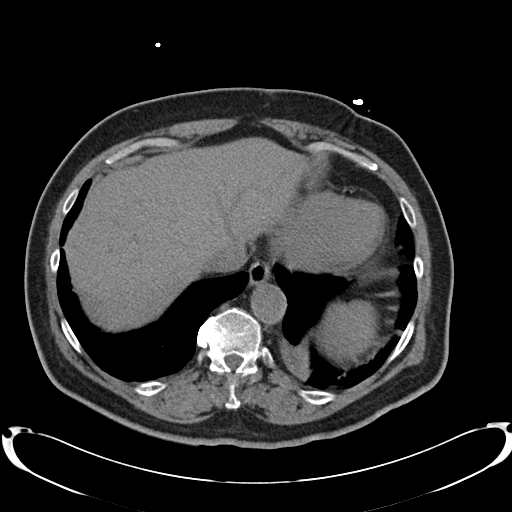
[im 131/151  lung]
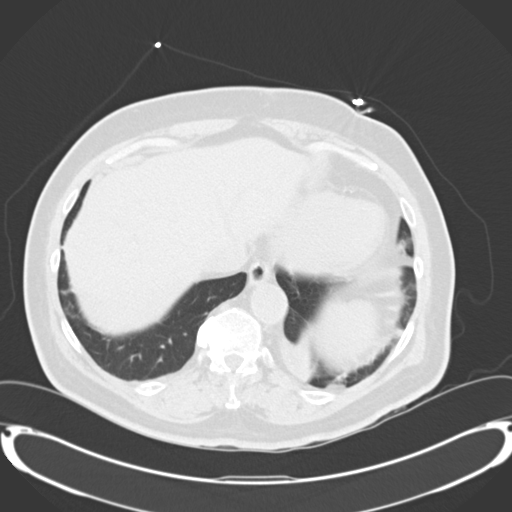
[im 137/151  lung]
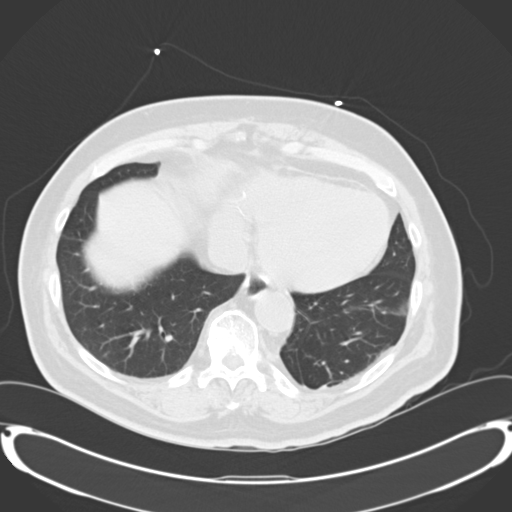
[im 144/151  soft-tissue]
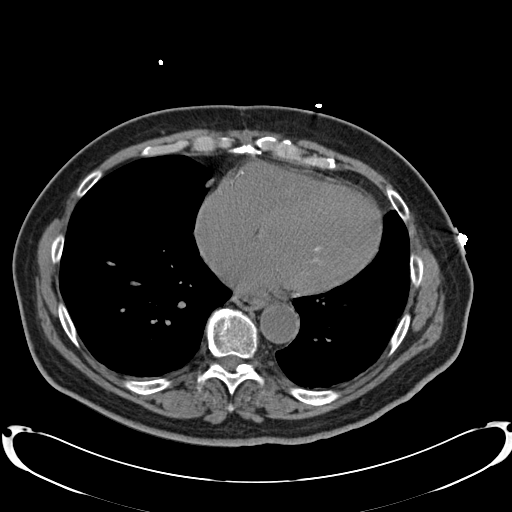
[im 144/151  lung]
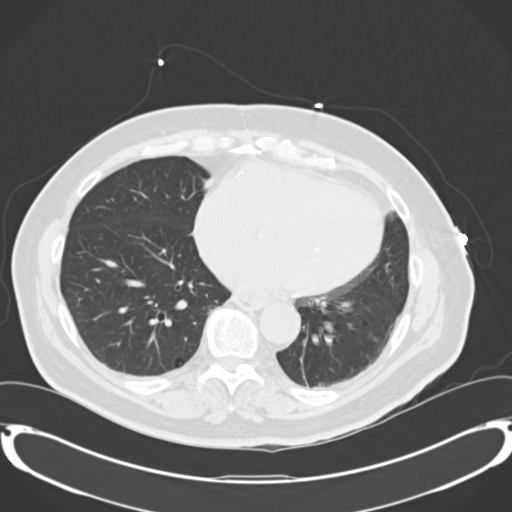

[15 of 32 positions shown; findings below may reference images not displayed]

FINDINGS: An area of increased density projects within the medial aspect of
the left lung base. This has a rounded consolidative appearance. Mild
emphysematous and fibrotic changes otherwise appreciated the lung bases.

Noncontrast evaluation of the liver, spleen, adrenals, pancreas are
unremarkable. Evaluation of the kidneys demonstrate small bilateral 1 to 2
mm sized medullary calculi. There also vascular calcifications appreciated.
Diffuse dysenteric arterial vascular calcification identified as well as
abdominal aortic mural calcifications.

There is no CT evidence of bowel obstruction, enteritis, colitis, nor
diverticulitis. There is diffuse diverticulosis within the descending and
sigmoid colon regions. A moderate amount of fecal retention appreciated
within the colon.

Within the limitations of a noncontrasted CT there is no evidence of free
fluid, loculated fluid collections, masses, nor pathologic sized adenopathy.
IMPRESSION: Indeterminate solid in density left lung base different
considerations are consolidative infiltrate, rounded atelectasis, nor
etiology such as a mass cannot be excluded. Further evaluation with
dedicated chest CT with contrast is recommended.
2. No CT evidence of obstructive or inflammatory abnormalities
diverticulosis is appreciated within the descending colon.

## 2014-09-18 IMAGING — NM NM LUNG SCAN
2 series · 16 of 16 positions shown · non-contrast
Comparison: none

REASON FOR EXAM: hypoxia, normal chest xray, chets pain, renal failure,
can not do CT with contra
COMMENTS:

[Series 1000: lung perfusion · 1.95mm/px · 4 acquisitions, 8 frames shown]
[im 1/4]
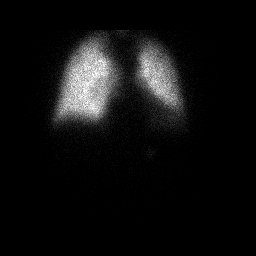
[im 1/4]
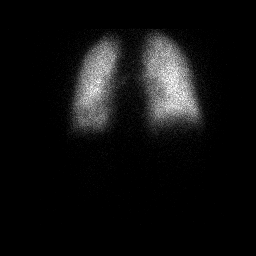
[im 2/4]
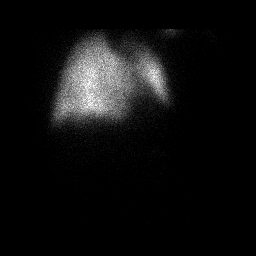
[im 2/4]
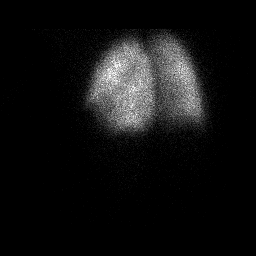
[im 3/4]
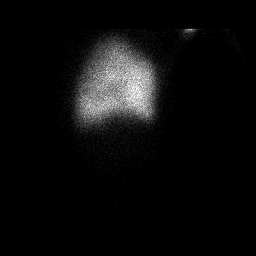
[im 3/4]
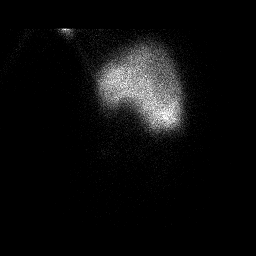
[im 4/4]
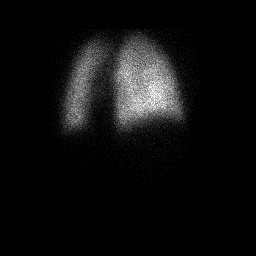
[im 4/4]
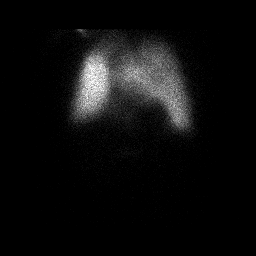

[Series 1000: lung ventilation · 3.90mm/px · 4 acquisitions, 8 frames shown]
[im 1/4]
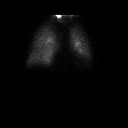
[im 1/4]
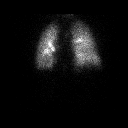
[im 2/4]
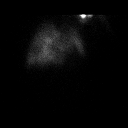
[im 2/4]
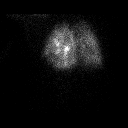
[im 3/4]
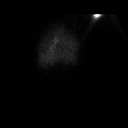
[im 3/4]
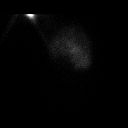
[im 4/4]
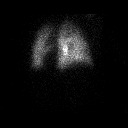
[im 4/4]
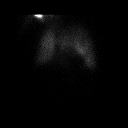

[16 of 16 positions shown; findings below may reference images not displayed]

PROCEDURE:     NM  - NM VQ LUNG SCAN  - [DATE]  [DATE] [DATE]  [DATE]

RESULT:

Procedure:

Ventilation: Aerosolized dosage of 4.089 mCi of technetium 99m labeled DTPA

Perfusion: Right antecubital injection of 4.11 mCi of technetium 99m labeled
MAA

Standard scintigraphic images during the ventilation and perfusion portions
were performed in standard obliquities.

Chest radiograph: This study was correlated with a two view chest radiograph
dated 06/29/2012.
FINDINGS: There is minimal blunting of the left costophrenic angle as well as mild
increased density in the region of the lingula. No further focal regions of
consolidation or focal infiltrates are appreciated. There is mild prominence
of the interstitial markings and mild peribronchial cuffing. The cardiac
silhouette is enlarged.

There is no evidence of pleural-based, wedge-shaped ventilation perfusion
mismatched defects. There is mild blunting of the left costophrenic angle
consistent with the two view chest radiograph. Homogeneous radiotracer
activity is otherwise appreciated on the ventilation and perfusion
evaluation.
IMPRESSION: Low probability ventilation/perfusion evaluation for
pulmonary arterial embolic disease.

## 2014-12-07 ENCOUNTER — Ambulatory Visit (INDEPENDENT_AMBULATORY_CARE_PROVIDER_SITE_OTHER): Payer: Medicare Other | Admitting: Sports Medicine

## 2014-12-07 ENCOUNTER — Ambulatory Visit: Payer: Medicare Other

## 2014-12-07 ENCOUNTER — Encounter: Payer: Self-pay | Admitting: Sports Medicine

## 2014-12-07 DIAGNOSIS — B351 Tinea unguium: Secondary | ICD-10-CM | POA: Diagnosis not present

## 2014-12-07 DIAGNOSIS — E119 Type 2 diabetes mellitus without complications: Secondary | ICD-10-CM

## 2014-12-07 DIAGNOSIS — M79673 Pain in unspecified foot: Secondary | ICD-10-CM

## 2014-12-07 NOTE — Progress Notes (Signed)
Patient ID: Ryan Mcclure, male   DOB: 12-Aug-1931, 81 y.o.   MRN: [id number] Subjective: Ryan Mcclure is a 81 y.o. male patient with history of type 2 diabetes who presents to office today complaining of long, painful nails  while ambulating in shoes; unable to trim. Patient states that the glucose reading this morning was around 150 mg/dl. Patient denies any new changes in medication or new problems. Patient denies any new cramping, numbness, burning or tingling in the legs.  There are no active problems to display for this patient.  Current Outpatient Prescriptions on File Prior to Visit  Medication Sig Dispense Refill  . AMLODIPINE BESYLATE PO Take by mouth daily.    Marland Kitchen aspirin 81 MG tablet Take 81 mg by mouth daily.    Marland Kitchen atorvastatin (LIPITOR) 40 MG tablet Take 40 mg by mouth daily.    . furosemide (LASIX) 40 MG tablet Take 40 mg by mouth daily.    . metFORMIN (GLUCOPHAGE) 500 MG tablet Take by mouth 2 (two) times daily with a meal.    . Metoprolol-Hydrochlorothiazide (METOPROLOL-HCTZ ER PO) Take by mouth daily.    . nitroGLYCERIN (NITROSTAT) 0.4 MG SL tablet Place 0.4 mg under the tongue as needed for chest pain.    Marland Kitchen omeprazole (PRILOSEC) 40 MG capsule Take 40 mg by mouth daily.     No current facility-administered medications on file prior to visit.   Allergies  Allergen Reactions  . Amlodipine Nausea And Vomiting  . Neuromuscular Blocking Agents Nausea And Vomiting    Labs: HEMOGLOBIN A1C- No recent lab on file   Objective: General: Patient is awake, alert, and oriented x 3 and in no acute distress. Cane assisted gait.  Integument: Skin is warm, dry and supple bilateral. Nails are tender, long, thickened and  dystrophic with subungual debris, consistent with onychomycosis, 1-5 bilateral. No signs of infection. No open lesions or preulcerative lesions present bilateral. Remaining integument unremarkable.  Vasculature:  Dorsalis Pedis pulse 1/4 bilateral. Posterior Tibial  pulse 1/4 bilateral.  Capillary fill time <3 sec 1-5 bilateral. Positive hair growth to the level of the digits. Temperature gradient within normal limits. No varicosities present bilateral. No edema present bilateral.   Neurology: The patient has intact sensation measured with a 5.07/10g Semmes Weinstein Monofilament at all pedal sites bilateral . Vibratory sensation diminished bilateral with tuning fork. No Babinski sign present bilateral.   Musculoskeletal: No gross pedal deformities noted bilateral. Muscular strength 5/5 in all lower extremity muscular groups bilateral without pain or limitation on range of motion . No tenderness with calf compression bilateral.  Assessment and Plan: Problem List Items Addressed This Visit    None    Visit Diagnoses    Dermatophytosis of nail    -  Primary    Foot pain, unspecified laterality        Type 2 diabetes mellitus without complication, unspecified long term insulin use status ([place])          -Examined patient. -Discussed and educated patient on diabetic foot care, especially with  regards to the vascular, neurological and musculoskeletal systems.  -Stressed the importance of good glycemic control and the detriment of not  controlling glucose levels in relation to the foot. -Mechanically debrided all nails 1-5 bilateral using sterile nail nipper and filed with dremel without incident  -Answered all patient questions -Patient to return in 3 months for at risk foot care -Patient advised to call the office if any problems or questions arise in the  Meantime.  Landis Martins, DPM

## 2015-03-11 ENCOUNTER — Ambulatory Visit (INDEPENDENT_AMBULATORY_CARE_PROVIDER_SITE_OTHER): Payer: Medicare Other | Admitting: Sports Medicine

## 2015-03-11 ENCOUNTER — Encounter: Payer: Self-pay | Admitting: Sports Medicine

## 2015-03-11 DIAGNOSIS — B351 Tinea unguium: Secondary | ICD-10-CM

## 2015-03-11 DIAGNOSIS — M79673 Pain in unspecified foot: Secondary | ICD-10-CM

## 2015-03-11 DIAGNOSIS — E119 Type 2 diabetes mellitus without complications: Secondary | ICD-10-CM | POA: Diagnosis not present

## 2015-03-11 NOTE — Progress Notes (Signed)
Patient ID: Ryan Mcclure, male   DOB: [birth year], 82 y.o.   MRN: [id number]  Subjective: Ryan Mcclure is a 82 y.o. male patient with history of type 2 diabetes who presents to office today complaining of long, painful nails  while ambulating in shoes; unable to trim. Patient states that the glucose reading this morning was not recorded but it's been up and down. Patient denies any new changes in medication or new problems. Patient denies any new cramping, numbness, burning or tingling in the legs.  There are no active problems to display for this patient.  Current Outpatient Prescriptions on File Prior to Visit  Medication Sig Dispense Refill  . AMLODIPINE BESYLATE PO Take by mouth daily.    Marland Kitchen aspirin 81 MG tablet Take 81 mg by mouth daily.    Marland Kitchen atorvastatin (LIPITOR) 40 MG tablet Take 40 mg by mouth daily.    . furosemide (LASIX) 40 MG tablet Take 40 mg by mouth daily.    . metFORMIN (GLUCOPHAGE) 500 MG tablet Take by mouth 2 (two) times daily with a meal.    . Metoprolol-Hydrochlorothiazide (METOPROLOL-HCTZ ER PO) Take by mouth daily.    . nitroGLYCERIN (NITROSTAT) 0.4 MG SL tablet Place 0.4 mg under the tongue as needed for chest pain.    Marland Kitchen omeprazole (PRILOSEC) 40 MG capsule Take 40 mg by mouth daily.     No current facility-administered medications on file prior to visit.   Allergies  Allergen Reactions  . Amlodipine Nausea And Vomiting  . Neuromuscular Blocking Agents Nausea And Vomiting    Labs: HEMOGLOBIN A1C- No recent lab on file   Objective: General: Patient is awake, alert, and oriented x 3 and in no acute distress. Cane assisted gait.  Integument: Skin is warm, dry and supple bilateral. Nails are tender, long, thickened and  dystrophic with subungual debris, consistent with onychomycosis, 1-5 bilateral. No signs of infection. No open lesions or preulcerative lesions present bilateral. Remaining integument unremarkable.  Vasculature:  Dorsalis Pedis pulse 1/4  bilateral. Posterior Tibial pulse 1/4 bilateral.  Capillary fill time <3 sec 1-5 bilateral. Positive hair growth to the level of the digits. Temperature gradient within normal limits. No varicosities present bilateral. No edema present bilateral.   Neurology: The patient has intact sensation measured with a 5.07/10g Semmes Weinstein Monofilament at all pedal sites bilateral . Vibratory sensation diminished bilateral with tuning fork. No Babinski sign present bilateral.   Musculoskeletal: No gross pedal deformities noted bilateral. Muscular strength 5/5 in all lower extremity muscular groups bilateral without pain or limitation on range of motion . No tenderness with calf compression bilateral.  Assessment and Plan: Problem List Items Addressed This Visit    None    Visit Diagnoses    Dermatophytosis of nail    -  Primary    Foot pain, unspecified laterality        Type 2 diabetes mellitus without complication, unspecified long term insulin use status ([place])          -Examined patient. -Discussed and educated patient on diabetic foot care, especially with  regards to the vascular, neurological and musculoskeletal systems.  -Stressed the importance of good glycemic control and the detriment of not  controlling glucose levels in relation to the foot. -Mechanically debrided all nails 1-5 bilateral using sterile nail nipper and filed with dremel without incident  -Answered all patient questions -Patient to return in 3 months for at risk foot care -Patient advised to call the office if any  problems or questions arise in the  Meantime.  Landis Martins, DPM

## 2015-06-10 ENCOUNTER — Encounter: Payer: Self-pay | Admitting: Sports Medicine

## 2015-06-10 ENCOUNTER — Ambulatory Visit (INDEPENDENT_AMBULATORY_CARE_PROVIDER_SITE_OTHER): Payer: Medicare Other | Admitting: Sports Medicine

## 2015-06-10 DIAGNOSIS — B351 Tinea unguium: Secondary | ICD-10-CM | POA: Diagnosis not present

## 2015-06-10 DIAGNOSIS — I639 Cerebral infarction, unspecified: Secondary | ICD-10-CM | POA: Insufficient documentation

## 2015-06-10 DIAGNOSIS — L98499 Non-pressure chronic ulcer of skin of other sites with unspecified severity: Secondary | ICD-10-CM | POA: Insufficient documentation

## 2015-06-10 DIAGNOSIS — M79676 Pain in unspecified toe(s): Secondary | ICD-10-CM

## 2015-06-10 DIAGNOSIS — H25019 Cortical age-related cataract, unspecified eye: Secondary | ICD-10-CM | POA: Insufficient documentation

## 2015-06-10 DIAGNOSIS — E782 Mixed hyperlipidemia: Secondary | ICD-10-CM | POA: Insufficient documentation

## 2015-06-10 DIAGNOSIS — E119 Type 2 diabetes mellitus without complications: Secondary | ICD-10-CM | POA: Diagnosis not present

## 2015-06-10 DIAGNOSIS — K219 Gastro-esophageal reflux disease without esophagitis: Secondary | ICD-10-CM | POA: Insufficient documentation

## 2015-06-10 DIAGNOSIS — D649 Anemia, unspecified: Secondary | ICD-10-CM | POA: Insufficient documentation

## 2015-06-10 NOTE — Progress Notes (Signed)
Patient ID: Ryan Mcclure, male   DOB: 1931/02/14, 82 y.o.   MRN: [id number]  Subjective: Ryan Mcclure is a 82 y.o. male patient with history of type 2 diabetes who presents to office today complaining of long, painful nails  while ambulating in shoes; unable to trim. Patient states that the glucose reading this morning was not recorded but it's been up and down. Admits there has been a change in his insulin medication. Patient denies any other new problems. Patient denies any new cramping, numbness, burning or tingling in the legs.  Patient Active Problem List   Diagnosis Date Noted  . Absolute anemia 06/10/2015  . Cataract cortical, senile 06/10/2015  . Controlled type 2 diabetes mellitus without complication ([place]) [id number]  . Acid reflux 06/10/2015  . Combined fat and carbohydrate induced hyperlipemia 06/10/2015  . Cerebrovascular accident (CVA) ([place]) 06/10/2015  . Non-pressure chronic ulcer of skin of other sites with unspecified severity ([place]) 06/10/2015  . Benign essential HTN 08/25/2014  . Arteriosclerosis of coronary artery 09/09/2013  . Microalbuminuria 09/08/2013  . Diabetes mellitus ([place]) 09/08/2013   Current Outpatient Prescriptions on File Prior to Visit  Medication Sig Dispense Refill  . AMLODIPINE BESYLATE PO Take by mouth daily.    Marland Kitchen aspirin 81 MG tablet Take 81 mg by mouth daily.    Marland Kitchen atorvastatin (LIPITOR) 40 MG tablet Take 40 mg by mouth daily.    . furosemide (LASIX) 40 MG tablet Take 40 mg by mouth daily.    . metFORMIN (GLUCOPHAGE) 500 MG tablet Take by mouth 2 (two) times daily with a meal.    . Metoprolol-Hydrochlorothiazide (METOPROLOL-HCTZ ER PO) Take by mouth daily.    . nitroGLYCERIN (NITROSTAT) 0.4 MG SL tablet Place 0.4 mg under the tongue as needed for chest pain.    Marland Kitchen omeprazole (PRILOSEC) 40 MG capsule Take 40 mg by mouth daily.     No current facility-administered medications on file prior to visit.   Allergies  Allergen Reactions  . Amlodipine  Nausea And Vomiting  . Neuromuscular Blocking Agents Nausea And Vomiting    Labs: HEMOGLOBIN A1C- No recent lab on file; per patient, around 7  Objective: General: Patient is awake, alert, and oriented x 3 and in no acute distress. Cane assisted gait.  Integument: Skin is warm, dry and supple bilateral. Nails are tender, long, thickened and  dystrophic with subungual debris, consistent with onychomycosis, 1-5 bilateral. No signs of infection. No open lesions or preulcerative lesions present bilateral. Remaining integument unremarkable.  Vasculature:  Dorsalis Pedis pulse 1/4 bilateral. Posterior Tibial pulse 1/4 bilateral.  Capillary fill time <3 sec 1-5 bilateral. Positive hair growth to the level of the digits. Temperature gradient within normal limits. No varicosities present bilateral. Trace edema present bilateral.   Neurology: The patient has intact sensation measured with a 5.07/10g Semmes Weinstein Monofilament at all pedal sites bilateral . Vibratory sensation diminished bilateral with tuning fork. No Babinski sign present bilateral.   Musculoskeletal: No gross pedal deformities noted bilateral. Muscular strength 5/5 in all lower extremity muscular groups bilateral without pain or limitation on range of motion . No tenderness with calf compression bilateral.  Assessment and Plan: Problem List Items Addressed This Visit      Endocrine   Diabetes mellitus ([place])    Other Visit Diagnoses    Pain due to onychomycosis of toenail    -  Primary      -Examined patient. -Discussed and educated patient on diabetic foot care, especially with  regards to the vascular, neurological and musculoskeletal systems.  -Stressed the importance of good glycemic control and the detriment of not  controlling glucose levels in relation to the foot. -Mechanically debrided all nails 1-5 bilateral using sterile nail nipper and filed with dremel without incident  -Encouraged lower limb elevation to  assist with trace edema control -Answered all patient questions -Patient to return in 3 months for at risk foot care -Patient advised to call the office if any problems or questions arise in the  Meantime.  Ryan Mcclure, DPM

## 2015-09-09 ENCOUNTER — Ambulatory Visit (INDEPENDENT_AMBULATORY_CARE_PROVIDER_SITE_OTHER): Payer: Medicare Other | Admitting: Sports Medicine

## 2015-09-09 ENCOUNTER — Encounter: Payer: Self-pay | Admitting: Sports Medicine

## 2015-09-09 DIAGNOSIS — E119 Type 2 diabetes mellitus without complications: Secondary | ICD-10-CM

## 2015-09-09 DIAGNOSIS — M79676 Pain in unspecified toe(s): Secondary | ICD-10-CM | POA: Diagnosis not present

## 2015-09-09 DIAGNOSIS — B351 Tinea unguium: Secondary | ICD-10-CM | POA: Diagnosis not present

## 2015-09-09 NOTE — Progress Notes (Signed)
Patient ID: Ryan Mcclure, male   DOB: 1931/02/15, 82 y.o.   MRN: [id number]  Subjective: Ryan Mcclure is a 82 y.o. male patient with history of type 2 diabetes who returns to office today complaining of long, painful nails  while ambulating in shoes; unable to trim. Patient states that the glucose reading this morning was "good". Patient denies any other new problems. Patient denies any new cramping, numbness, burning or tingling in the legs.  Patient Active Problem List   Diagnosis Date Noted  . Absolute anemia 06/10/2015  . Cataract cortical, senile 06/10/2015  . Controlled type 2 diabetes mellitus without complication ([place]) [id number]  . Acid reflux 06/10/2015  . Combined fat and carbohydrate induced hyperlipemia 06/10/2015  . Cerebrovascular accident (CVA) ([place]) 06/10/2015  . Non-pressure chronic ulcer of skin of other sites with unspecified severity ([place]) 06/10/2015  . Benign essential HTN 08/25/2014  . Arteriosclerosis of coronary artery 09/09/2013  . Microalbuminuria 09/08/2013  . Diabetes mellitus ([place]) 09/08/2013   Current Outpatient Prescriptions on File Prior to Visit  Medication Sig Dispense Refill  . AMLODIPINE BESYLATE PO Take by mouth daily.    Marland Kitchen aspirin 81 MG tablet Take 81 mg by mouth daily.    Marland Kitchen atorvastatin (LIPITOR) 40 MG tablet Take 40 mg by mouth daily.    . furosemide (LASIX) 40 MG tablet Take 40 mg by mouth daily.    . metFORMIN (GLUCOPHAGE) 500 MG tablet Take by mouth 2 (two) times daily with a meal.    . Metoprolol-Hydrochlorothiazide (METOPROLOL-HCTZ ER PO) Take by mouth daily.    . nitroGLYCERIN (NITROSTAT) 0.4 MG SL tablet Place 0.4 mg under the tongue as needed for chest pain.    Marland Kitchen omeprazole (PRILOSEC) 40 MG capsule Take 40 mg by mouth daily.     No current facility-administered medications on file prior to visit.    Allergies  Allergen Reactions  . Amlodipine Nausea And Vomiting  . Neuromuscular Blocking Agents Nausea And Vomiting     Objective: General: Patient is awake, alert, and oriented x 3 and in no acute distress. Cane assisted gait.  Integument: Skin is warm, dry and supple bilateral. Nails are tender, long, thickened and dystrophic with subungual debris, consistent with onychomycosis, 1-5 bilateral. No signs of infection. No open lesions or preulcerative lesions present bilateral. Remaining integument unremarkable.  Vasculature:  Dorsalis Pedis pulse 1/4 bilateral. Posterior Tibial pulse 1/4 bilateral.  Capillary fill time <3 sec 1-5 bilateral. Positive hair growth to the level of the digits. Temperature gradient within normal limits. No varicosities present bilateral. Trace edema present bilateral.   Neurology: The patient has intact sensation measured with a 5.07/10g Semmes Weinstein Monofilament at all pedal sites bilateral . Vibratory sensation diminished bilateral with tuning fork. No Babinski sign present bilateral.   Musculoskeletal: No gross pedal deformities noted bilateral. Muscular strength 5/5 in all lower extremity muscular groups bilateral without pain or limitation on range of motion . No tenderness with calf compression bilateral.  Assessment and Plan: Problem List Items Addressed This Visit    None    Visit Diagnoses    Pain due to onychomycosis of toenail    -  Primary   Type 2 diabetes mellitus without complication, unspecified long term insulin use status ([place])         -Examined patient. -Discussed and educated patient on diabetic foot care, especially with  regards to the vascular, neurological and musculoskeletal systems.  -Stressed the importance of good glycemic control and the detriment of  not  controlling glucose levels in relation to the foot. -Mechanically debrided all nails 1-5 bilateral using sterile nail nipper and filed with dremel without incident  -Encouraged lower limb elevation to assist with trace edema control -Answered all patient questions -Patient to return in 3  months for at risk foot care -Patient advised to call the office if any problems or questions arise in the  Meantime.  Landis Martins, DPM

## 2015-09-14 DIAGNOSIS — R0602 Shortness of breath: Secondary | ICD-10-CM | POA: Insufficient documentation

## 2015-10-12 DIAGNOSIS — I255 Ischemic cardiomyopathy: Secondary | ICD-10-CM | POA: Insufficient documentation

## 2015-10-18 ENCOUNTER — Emergency Department
Admission: EM | Admit: 2015-10-18 | Discharge: 2015-10-18 | Payer: Medicare Other | Attending: Emergency Medicine | Admitting: Emergency Medicine

## 2015-10-18 ENCOUNTER — Encounter: Payer: Self-pay | Admitting: Emergency Medicine

## 2015-10-18 ENCOUNTER — Encounter (HOSPITAL_COMMUNITY): Payer: Self-pay | Admitting: Internal Medicine

## 2015-10-18 ENCOUNTER — Inpatient Hospital Stay (HOSPITAL_COMMUNITY)
Admission: AD | Admit: 2015-10-18 | Discharge: 2015-10-23 | DRG: 378 | Disposition: A | Discharge status: Home Health | Payer: Medicare Other | Source: Other Acute Inpatient Hospital | Attending: Internal Medicine | Admitting: Internal Medicine

## 2015-10-18 DIAGNOSIS — E1151 Type 2 diabetes mellitus with diabetic peripheral angiopathy without gangrene: Secondary | ICD-10-CM | POA: Diagnosis not present

## 2015-10-18 DIAGNOSIS — D62 Acute posthemorrhagic anemia: Secondary | ICD-10-CM | POA: Diagnosis present

## 2015-10-18 DIAGNOSIS — E7800 Pure hypercholesterolemia, unspecified: Secondary | ICD-10-CM | POA: Diagnosis present

## 2015-10-18 DIAGNOSIS — I10 Essential (primary) hypertension: Secondary | ICD-10-CM | POA: Diagnosis present

## 2015-10-18 DIAGNOSIS — I130 Hypertensive heart and chronic kidney disease with heart failure and stage 1 through stage 4 chronic kidney disease, or unspecified chronic kidney disease: Secondary | ICD-10-CM | POA: Diagnosis not present

## 2015-10-18 DIAGNOSIS — D500 Iron deficiency anemia secondary to blood loss (chronic): Secondary | ICD-10-CM | POA: Diagnosis present

## 2015-10-18 DIAGNOSIS — Z7982 Long term (current) use of aspirin: Secondary | ICD-10-CM

## 2015-10-18 DIAGNOSIS — I252 Old myocardial infarction: Secondary | ICD-10-CM | POA: Diagnosis not present

## 2015-10-18 DIAGNOSIS — Z953 Presence of xenogenic heart valve: Secondary | ICD-10-CM | POA: Diagnosis not present

## 2015-10-18 DIAGNOSIS — E1122 Type 2 diabetes mellitus with diabetic chronic kidney disease: Secondary | ICD-10-CM | POA: Diagnosis present

## 2015-10-18 DIAGNOSIS — I639 Cerebral infarction, unspecified: Secondary | ICD-10-CM | POA: Diagnosis present

## 2015-10-18 DIAGNOSIS — I5022 Chronic systolic (congestive) heart failure: Secondary | ICD-10-CM | POA: Diagnosis present

## 2015-10-18 DIAGNOSIS — D696 Thrombocytopenia, unspecified: Secondary | ICD-10-CM | POA: Diagnosis not present

## 2015-10-18 DIAGNOSIS — K644 Residual hemorrhoidal skin tags: Secondary | ICD-10-CM | POA: Diagnosis present

## 2015-10-18 DIAGNOSIS — Z951 Presence of aortocoronary bypass graft: Secondary | ICD-10-CM | POA: Diagnosis not present

## 2015-10-18 DIAGNOSIS — K921 Melena: Secondary | ICD-10-CM

## 2015-10-18 DIAGNOSIS — N183 Chronic kidney disease, stage 3 unspecified: Secondary | ICD-10-CM | POA: Diagnosis present

## 2015-10-18 DIAGNOSIS — Z955 Presence of coronary angioplasty implant and graft: Secondary | ICD-10-CM

## 2015-10-18 DIAGNOSIS — Z6830 Body mass index (BMI) 30.0-30.9, adult: Secondary | ICD-10-CM | POA: Diagnosis not present

## 2015-10-18 DIAGNOSIS — Z8249 Family history of ischemic heart disease and other diseases of the circulatory system: Secondary | ICD-10-CM

## 2015-10-18 DIAGNOSIS — Z8673 Personal history of transient ischemic attack (TIA), and cerebral infarction without residual deficits: Secondary | ICD-10-CM

## 2015-10-18 DIAGNOSIS — E119 Type 2 diabetes mellitus without complications: Secondary | ICD-10-CM

## 2015-10-18 DIAGNOSIS — Z7984 Long term (current) use of oral hypoglycemic drugs: Secondary | ICD-10-CM | POA: Diagnosis not present

## 2015-10-18 DIAGNOSIS — M6281 Muscle weakness (generalized): Secondary | ICD-10-CM

## 2015-10-18 DIAGNOSIS — E669 Obesity, unspecified: Secondary | ICD-10-CM | POA: Diagnosis present

## 2015-10-18 DIAGNOSIS — Z87891 Personal history of nicotine dependence: Secondary | ICD-10-CM | POA: Diagnosis not present

## 2015-10-18 DIAGNOSIS — R110 Nausea: Secondary | ICD-10-CM | POA: Diagnosis present

## 2015-10-18 DIAGNOSIS — Z79899 Other long term (current) drug therapy: Secondary | ICD-10-CM

## 2015-10-18 DIAGNOSIS — Z888 Allergy status to other drugs, medicaments and biological substances status: Secondary | ICD-10-CM

## 2015-10-18 DIAGNOSIS — K5733 Diverticulitis of large intestine without perforation or abscess with bleeding: Secondary | ICD-10-CM | POA: Diagnosis present

## 2015-10-18 DIAGNOSIS — I2510 Atherosclerotic heart disease of native coronary artery without angina pectoris: Secondary | ICD-10-CM | POA: Diagnosis not present

## 2015-10-18 DIAGNOSIS — Z833 Family history of diabetes mellitus: Secondary | ICD-10-CM | POA: Diagnosis not present

## 2015-10-18 DIAGNOSIS — K922 Gastrointestinal hemorrhage, unspecified: Secondary | ICD-10-CM | POA: Diagnosis not present

## 2015-10-18 DIAGNOSIS — K219 Gastro-esophageal reflux disease without esophagitis: Secondary | ICD-10-CM | POA: Diagnosis not present

## 2015-10-18 DIAGNOSIS — K5730 Diverticulosis of large intestine without perforation or abscess without bleeding: Secondary | ICD-10-CM

## 2015-10-18 DIAGNOSIS — K648 Other hemorrhoids: Secondary | ICD-10-CM | POA: Diagnosis not present

## 2015-10-18 DIAGNOSIS — K2961 Other gastritis with bleeding: Secondary | ICD-10-CM | POA: Diagnosis not present

## 2015-10-18 DIAGNOSIS — K649 Unspecified hemorrhoids: Secondary | ICD-10-CM

## 2015-10-18 DIAGNOSIS — K625 Hemorrhage of anus and rectum: Secondary | ICD-10-CM | POA: Diagnosis present

## 2015-10-18 DIAGNOSIS — K2901 Acute gastritis with bleeding: Secondary | ICD-10-CM | POA: Diagnosis not present

## 2015-10-18 DIAGNOSIS — Z794 Long term (current) use of insulin: Secondary | ICD-10-CM

## 2015-10-18 DIAGNOSIS — R531 Weakness: Secondary | ICD-10-CM | POA: Insufficient documentation

## 2015-10-18 HISTORY — DX: Chronic systolic (congestive) heart failure: I50.22

## 2015-10-18 HISTORY — DX: Cerebral infarction, unspecified: I63.9

## 2015-10-18 HISTORY — DX: Chronic kidney disease, stage 3 (moderate): N18.3

## 2015-10-18 HISTORY — DX: Chronic kidney disease, stage 3 unspecified: N18.30

## 2015-10-18 LAB — CBC
HCT: 24 % — ABNORMAL LOW (ref 40.0–52.0)
HCT: 24.9 % — ABNORMAL LOW (ref 39.0–52.0)
Hemoglobin: 7.8 g/dL — ABNORMAL LOW (ref 13.0–18.0)
Hemoglobin: 7.8 g/dL — ABNORMAL LOW (ref 13.0–17.0)
MCH: 26.8 pg (ref 26.0–34.0)
MCH: 27.1 pg (ref 26.0–34.0)
MCHC: 32.3 g/dL (ref 32.0–36.0)
MCHC: 31.3 g/dL (ref 30.0–36.0)
MCV: 82.8 fL (ref 80.0–100.0)
MCV: 86.5 fL (ref 78.0–100.0)
PLATELETS: 98 10*3/uL — AB (ref 150–440)
Platelets: 107 10*3/uL — ABNORMAL LOW (ref 150–400)
RBC: 2.9 MIL/uL — ABNORMAL LOW (ref 4.40–5.90)
RBC: 2.88 MIL/uL — ABNORMAL LOW (ref 4.22–5.81)
RDW: 14.1 % (ref 11.5–14.5)
RDW: 14.5 % (ref 11.5–15.5)
WBC: 11.5 10*3/uL — ABNORMAL HIGH (ref 3.8–10.6)
WBC: 11.2 10*3/uL — AB (ref 4.0–10.5)

## 2015-10-18 LAB — PROTIME-INR
INR: 1.06
Prothrombin Time: 13.8 seconds (ref 11.4–15.2)

## 2015-10-18 LAB — COMPREHENSIVE METABOLIC PANEL
ALT: 14 U/L — ABNORMAL LOW (ref 17–63)
ANION GAP: 5 (ref 5–15)
AST: 19 U/L (ref 15–41)
Albumin: 3.2 g/dL — ABNORMAL LOW (ref 3.5–5.0)
Alkaline Phosphatase: 69 U/L (ref 38–126)
BUN: 38 mg/dL — ABNORMAL HIGH (ref 6–20)
CHLORIDE: 109 mmol/L (ref 101–111)
CO2: 27 mmol/L (ref 22–32)
Calcium: 8.7 mg/dL — ABNORMAL LOW (ref 8.9–10.3)
Creatinine, Ser: 1.84 mg/dL — ABNORMAL HIGH (ref 0.61–1.24)
GFR calc non Af Amer: 33 mL/min — ABNORMAL LOW (ref >60)
GFR, EST AFRICAN AMERICAN: 38 mL/min — AB (ref >60)
Glucose, Bld: 126 mg/dL — ABNORMAL HIGH (ref 65–99)
Potassium: 4.4 mmol/L (ref 3.5–5.1)
SODIUM: 141 mmol/L (ref 135–145)
Total Bilirubin: 0.4 mg/dL (ref 0.3–1.2)
Total Protein: 6.2 g/dL — ABNORMAL LOW (ref 6.5–8.1)

## 2015-10-18 LAB — GLUCOSE, CAPILLARY: Glucose-Capillary: 179 mg/dL — ABNORMAL HIGH (ref 65–99)

## 2015-10-18 LAB — TYPE AND SCREEN
ABO/RH(D): B POS
ANTIBODY SCREEN: NEGATIVE

## 2015-10-18 LAB — TROPONIN I
TROPONIN I: 0.03 ng/mL — AB (ref <0.03)
Troponin I: 0.03 ng/mL (ref <0.03)

## 2015-10-18 LAB — ABO/RH
ABO/RH(D): B POS
ABO/RH(D): B POS

## 2015-10-18 LAB — BRAIN NATRIURETIC PEPTIDE: B NATRIURETIC PEPTIDE 5: 91.9 pg/mL (ref 0.0–100.0)

## 2015-10-18 LAB — MRSA PCR SCREENING: MRSA by PCR: NEGATIVE

## 2015-10-18 LAB — PREPARE RBC (CROSSMATCH)

## 2015-10-18 LAB — APTT: APTT: 31 s (ref 24–36)

## 2015-10-18 MED ORDER — ACETAMINOPHEN 650 MG RE SUPP
650.0000 mg | Freq: Four times a day (QID) | RECTAL | Status: DC | PRN
Start: 2015-10-18 — End: 2015-10-23

## 2015-10-18 MED ORDER — ACETAMINOPHEN 325 MG PO TABS: 650.0000 mg | ORAL_TABLET | Freq: Four times a day (QID) | ORAL | Status: DC | PRN

## 2015-10-18 MED ORDER — ATORVASTATIN CALCIUM 40 MG PO TABS
40.0000 mg | ORAL_TABLET | Freq: Every day | ORAL | Status: DC
  Administered 2015-10-19 – 2015-10-22 (×4): 40 mg via ORAL
  Filled 2015-10-18 (×4): qty 1

## 2015-10-18 MED ORDER — ONDANSETRON HCL 4 MG/2ML IJ SOLN: 4.0000 mg | Freq: Three times a day (TID) | INTRAMUSCULAR | Status: DC | PRN

## 2015-10-18 MED ORDER — FLUTICASONE PROPIONATE 50 MCG/ACT NA SUSP
1.0000 | Freq: Every day | NASAL | Status: DC | PRN
  Filled 2015-10-18: qty 16

## 2015-10-18 MED ORDER — NITROGLYCERIN 0.4 MG SL SUBL: 0.4000 mg | SUBLINGUAL_TABLET | SUBLINGUAL | Status: DC | PRN

## 2015-10-18 MED ORDER — SODIUM CHLORIDE 0.9% FLUSH
3.0000 mL | Freq: Two times a day (BID) | INTRAVENOUS | Status: DC
Start: 2015-10-18 — End: 2015-10-23
  Administered 2015-10-19 – 2015-10-22 (×3): 3 mL via INTRAVENOUS

## 2015-10-18 MED ORDER — LISINOPRIL 10 MG PO TABS
5.0000 mg | ORAL_TABLET | Freq: Every day | ORAL | Status: DC
  Administered 2015-10-19 – 2015-10-23 (×5): 5 mg via ORAL
  Filled 2015-10-18: qty 2
  Filled 2015-10-18 (×2): qty 1
  Filled 2015-10-18 (×2): qty 2

## 2015-10-18 MED ORDER — SODIUM CHLORIDE 0.9 % IV SOLN: 10.0000 mL/h | Freq: Once | INTRAVENOUS | Status: DC

## 2015-10-18 MED ORDER — VITAMIN B-12 1000 MCG PO TABS
1000.0000 ug | ORAL_TABLET | Freq: Every day | ORAL | Status: DC
  Administered 2015-10-19 – 2015-10-23 (×5): 1000 ug via ORAL
  Filled 2015-10-18 (×7): qty 1

## 2015-10-18 MED ORDER — SODIUM CHLORIDE 0.9 % IV SOLN: Freq: Once | INTRAVENOUS | Status: DC

## 2015-10-18 MED ORDER — FERROUS SULFATE 325 (65 FE) MG PO TABS
325.0000 mg | ORAL_TABLET | Freq: Every day | ORAL | Status: DC
Start: 2015-10-18 — End: 2015-10-23
  Administered 2015-10-18 – 2015-10-23 (×6): 325 mg via ORAL
  Filled 2015-10-18 (×6): qty 1

## 2015-10-18 MED ORDER — HYDRALAZINE HCL 20 MG/ML IJ SOLN
5.0000 mg | INTRAMUSCULAR | Status: DC | PRN
  Filled 2015-10-18: qty 1

## 2015-10-18 MED ORDER — SODIUM CHLORIDE 0.9 % IV SOLN
8.0000 mg/h | INTRAVENOUS | Status: DC
  Administered 2015-10-18: 8 mg/h via INTRAVENOUS
  Filled 2015-10-18 (×2): qty 80

## 2015-10-18 MED ORDER — INSULIN GLARGINE 100 UNIT/ML ~~LOC~~ SOLN
18.0000 [IU] | Freq: Every day | SUBCUTANEOUS | Status: DC
  Administered 2015-10-18 – 2015-10-22 (×5): 18 [IU] via SUBCUTANEOUS
  Filled 2015-10-18 (×5): qty 0.18

## 2015-10-18 MED ORDER — SODIUM CHLORIDE 0.9 % IV SOLN
8.0000 mg/h | INTRAVENOUS | Status: DC
  Administered 2015-10-19 – 2015-10-20 (×4): 8 mg/h via INTRAVENOUS
  Filled 2015-10-18 (×12): qty 80

## 2015-10-18 MED ORDER — SODIUM CHLORIDE 0.9 % IV SOLN
80.0000 mg | Freq: Once | INTRAVENOUS | Status: AC
  Administered 2015-10-18: 80 mg via INTRAVENOUS
  Filled 2015-10-18 (×2): qty 80

## 2015-10-18 MED ORDER — SODIUM CHLORIDE 0.9 % IV SOLN
INTRAVENOUS | Status: DC
  Administered 2015-10-18: 23:00:00 via INTRAVENOUS

## 2015-10-18 MED ORDER — MORPHINE SULFATE (PF) 2 MG/ML IV SOLN: 2.0000 mg | INTRAVENOUS | Status: DC | PRN

## 2015-10-18 MED ORDER — METOPROLOL TARTRATE 50 MG PO TABS
50.0000 mg | ORAL_TABLET | Freq: Every day | ORAL | Status: DC
  Administered 2015-10-19 – 2015-10-23 (×5): 50 mg via ORAL
  Filled 2015-10-18: qty 1
  Filled 2015-10-18 (×3): qty 2
  Filled 2015-10-18: qty 1

## 2015-10-18 MED ORDER — INSULIN ASPART 100 UNIT/ML ~~LOC~~ SOLN
0.0000 [IU] | Freq: Three times a day (TID) | SUBCUTANEOUS | Status: DC
  Administered 2015-10-19: 3 [IU] via SUBCUTANEOUS
  Administered 2015-10-20: 1 [IU] via SUBCUTANEOUS
  Administered 2015-10-20 – 2015-10-21 (×3): 3 [IU] via SUBCUTANEOUS

## 2015-10-18 NOTE — ED Provider Notes (Addendum)
[facility] Emergency Department Provider Note  Time seen: 4:09 PM  I have reviewed the triage vital signs and the nursing notes.   HISTORY  Chief Complaint Nausea and Weakness    HPI Ryan Mcclure is a 82 y.o. male with a past medical history of diabetes, hypertension, MI status post CABG, who presents to the emergency department with nausea, generalized weakness and rectal bleeding. According to the patient for the past 3 days he has been having bright red blood in his stool. States he has been feeling nauseous but denies any vomiting. Denies any abdominal pain or fever. Patient states a sense of generalized weakness but denies any focal weakness or numbness. Describes his weakness is moderate, worse with exertion.  Past Medical History:  Diagnosis Date  . Diabetes ([place])   . HBP (high blood pressure)   . Heart attack ([place])   . High cholesterol   . Swelling     Patient Active Problem List   Diagnosis Date Noted  . Absolute anemia 06/10/2015  . Cataract cortical, senile 06/10/2015  . Controlled type 2 diabetes mellitus without complication ([place]) 16/10/9602  . Acid reflux 06/10/2015  . Combined fat and carbohydrate induced hyperlipemia 06/10/2015  . Cerebrovascular accident (CVA) ([place]) 06/10/2015  . Non-pressure chronic ulcer of skin of other sites with unspecified severity ([place]) 06/10/2015  . Benign essential HTN 08/25/2014  . Arteriosclerosis of coronary artery 09/09/2013  . Microalbuminuria 09/08/2013  . Diabetes mellitus ([place]) 09/08/2013    Past Surgical History:  Procedure Laterality Date  . EXPLORATION POST OPERATIVE OPEN HEART    . EYE SURGERY Bilateral     Prior to Admission medications   Medication Sig Start Date End Date Taking? Authorizing Provider  AMLODIPINE BESYLATE PO Take by mouth daily.    Historical Provider, MD  aspirin 81 MG tablet Take 81 mg by mouth daily.    Historical Provider, MD  atorvastatin (LIPITOR) 40 MG tablet  Take 40 mg by mouth daily.    Historical Provider, MD  furosemide (LASIX) 40 MG tablet Take 40 mg by mouth daily.    Historical Provider, MD  metFORMIN (GLUCOPHAGE) 500 MG tablet Take by mouth 2 (two) times daily with a meal.    Historical Provider, MD  Metoprolol-Hydrochlorothiazide (METOPROLOL-HCTZ ER PO) Take by mouth daily.    Historical Provider, MD  nitroGLYCERIN (NITROSTAT) 0.4 MG SL tablet Place 0.4 mg under the tongue as needed for chest pain.    Historical Provider, MD  omeprazole (PRILOSEC) 40 MG capsule Take 40 mg by mouth daily.    Historical Provider, MD    Allergies  Allergen Reactions  . Amlodipine Nausea And Vomiting  . Neuromuscular Blocking Agents Nausea And Vomiting    No family history on file.  Social History Social History  Substance Use Topics  . Smoking status: Former Smoker    Types: Cigarettes    Quit date: 01/30/1995  . Smokeless tobacco: Never Used  . Alcohol use No    Review of Systems Constitutional: Negative for fever.Positive for generalized weakness Cardiovascular: Negative for chest pain. Respiratory: Negative for shortness of breath. Gastrointestinal: Negative for abdominal pain Genitourinary: Negative for dysuria. Musculoskeletal: Negative for back pain. Neurological: Negative for headaches, focal weakness or numbness. 10-point ROS otherwise negative.  ____________________________________________   PHYSICAL EXAM:  VITAL SIGNS: ED Triage Vitals  Enc Vitals Group     BP 10/18/15 1604 (!) 138/56     Pulse Rate 10/18/15 1604 66     Resp  10/18/15 1604 (!) 22     Temp 10/18/15 1604 98.6 F (37 C)     Temp Source 10/18/15 1604 Oral     SpO2 10/18/15 1604 97 %     Weight 10/18/15 1605 200 lb (90.7 kg)     Height 10/18/15 1605 5\' 7"  (1.702 m)     Head Circumference --      Peak Flow --      Pain Score --      Pain Loc --      Pain Edu? --      Excl. in [place]? --     Constitutional: Alert and oriented. Well appearing and in no  distress. Eyes: Normal exam ENT   Head: Normocephalic and atraumatic   Mouth/Throat: Mucous membranes are moist. Cardiovascular: Normal rate, regular rhythm. Respiratory: Normal respiratory effort without tachypnea nor retractions. Breath sounds are clear Gastrointestinal: Soft and nontender. No distention.  Musculoskeletal: Nontender with normal range of motion in all extremities. No lower extremity tenderness  Neurologic:  Normal speech and language. No gross focal neurologic deficits Skin:  Skin is warm, dry and intact.  Psychiatric: Mood and affect are normal. Speech and behavior are normal.   ____________________________________________    EKG  EKG reviewed and interpreted, self shows a junctional versus sinus rhythm at 66 bpm, slightly widened QRS, normal axis, largely normal intervals, nonspecific but no concerning ST changes. Electrical interference.  ____________________________________________   INITIAL IMPRESSION / ASSESSMENT AND PLAN / ED COURSE  Pertinent labs & imaging results that were available during my care of the patient were reviewed by me and considered in my medical decision making (see chart for details).  Patient presents the emergency department with generalized weakness, rectal bleeding for the past 3-4 days. Rectal exam shows melena, dark stool, strongly guaiac positive. Nontender abdominal exam. We will check labs including type and screen, INR. The patient does not appear to be on any blood thinners. Anticipated admission given symptomatic GI bleed. Likely lower GI bleed.  Patient's labs have resulted showing hemoglobin of 7.8. Patient's last recorded hemoglobin is 12 July 11 according to care everywhere.  Unfortunately we do not have GI medicine coverage. I discussed with the hospitalist team and they would prefer transfer for the patient to a facility that has GI coverage. We'll discuss with most: Hospital. Patient agreeable to this plan.  Patient  has been accepted to [facility] stepdown bed. They stated will likely be a while before bed assignment. Given the likely delay I discussed with the hospitalist admitting at [facility] would like the patient to be transfused with 1 unit, we will do so she would also like the patient started on a Protonix drip until they can further work up the patient. Patient will be started on a Protonix bolus and infusion along with 1 unit transfusion in the emergency department prior to transfer to [place] Performed by: Harvest Dark   Total critical care time: 30 minutes  Critical care time was exclusive of separately billable procedures and treating other patients.  Critical care was necessary to treat or prevent imminent or life-threatening deterioration.  Critical care was time spent personally by me on the following activities: development of treatment plan with patient and/or surrogate as well as nursing, discussions with consultants, evaluation of patient's response to treatment, examination of patient, obtaining history from patient or surrogate, ordering and performing treatments and interventions, ordering and review of laboratory studies, ordering and review of  radiographic studies, pulse oximetry and re-evaluation of patient's condition.   ____________________________________________   FINAL CLINICAL IMPRESSION(S) / ED DIAGNOSES  GI bleed Generalized weakness    Harvest Dark, MD 10/18/15 1729    Harvest Dark, MD 10/18/15 1756

## 2015-10-18 NOTE — ED Notes (Signed)
Pharmacy notified to send protonix drip.

## 2015-10-18 NOTE — ED Notes (Signed)
[facility] is on the way; per ED MD, we will not transfuse RBC since transfer is going to be sooner than originally anticipated.

## 2015-10-18 NOTE — ED Notes (Signed)
Pt provided [place] sandwich tray.

## 2015-10-18 NOTE — ED Notes (Signed)
Pharmacy notified again and asked to send protonix drip asap so that it can be hung prior to pt transfer out.

## 2015-10-18 NOTE — ED Triage Notes (Signed)
Pt in via EMS from [facility], pt reports feeling "weak/quezy" x 2-3 days noticing some blood in his stool over the last couple of days.  Pt denies any abdominal pain, reports nausea, denies any vomiting or diarrhea.  Pt vitals WDL, no immediate distress noted.  MD at bedside.

## 2015-10-18 NOTE — H&P (Addendum)
History and Physical    Ryan Mcclure EXH:[id number] DOB: Jan 09, 1932 DOA: 10/18/2015  Referring MD/NP/PA:   PCP: [place] Pink, MD   Patient coming from:  The patient is coming from home.  At baseline, pt is independent for most of ADL.   Chief Complaint: rectal bleeding and generalized weakness  HPI: Ryan Mcclure is a 82 y.o. male with medical history significant of GIB, AVM, diverticulosis, hypertension, hyperlipidemia, diabetes mellitus, CAD, s/p of CABG, s/p of sent, s/p of AVR with bioprosthetic valve, PVD, s/p of bilateral carotid artery endarterectomy, CKD-III, who presents with rectal bleeding and generalized weakness.  Patient is transferred from [facility] hospital due to lack of GI coverage.  Patient states that he has been having generalized weakness and rectal bleeding in the past 3 days. He also has mild dizziness and light headedness. No chest pain, shortness of breath or palpitation. Denies nausea, vomiting and abdominal pain. Patient states that he noted blood in his stool every time he has bowel movement. Patient denies fever, chills, symptoms of UTI or unilateral weakness. Pt is on ASA 81 mg daily, not on other anticoagulants.  ED Course: pt was found to have  hemoglobin dropped from 12.1 on 08/09/15-->7.8, INR 1.06, positive trop 0.03, WBC 11.5, platelets 98, stable renal function, temperature normal, no tachycardia, blood pressure 144/46. Pt is admitted to SDU as inpt. GI, Dr. Hilarie Fredrickson was consulted.   Review of Systems:   General: no fevers, chills, no changes in body weight, has fatigue HEENT: no blurry vision, hearing changes or sore throat Respiratory: no dyspnea, coughing, wheezing CV: no chest pain, no palpitations GI: no nausea, vomiting, abdominal pain, diarrhea, constipation. Has rectal bleeding. GU: no dysuria, burning on urination, increased urinary frequency, hematuria  Ext: no leg edema Neuro: no unilateral weakness, numbness, or tingling, no vision  change or hearing loss Skin: no rash, no skin tear. MSK: No muscle spasm, no deformity, no limitation of range of movement in spin Heme: No easy bruising.  Travel history: No recent long distant travel.  Allergy:  Allergies  Allergen Reactions  . Amlodipine Nausea And Vomiting  . Neuromuscular Blocking Agents Nausea And Vomiting    Past Medical History:  Diagnosis Date  . Chronic systolic CHF (congestive heart failure) ([place])   . CKD (chronic kidney disease), stage III   . Diabetes ([place])   . HBP (high blood pressure)   . Heart attack ([place])   . High cholesterol   . Stroke (cerebrum) ([place])   . Swelling     Past Surgical History:  Procedure Laterality Date  . EXPLORATION POST OPERATIVE OPEN HEART    . EYE SURGERY Bilateral     Social History:  reports that he quit smoking about 20 years ago. His smoking use included Cigarettes. He has never used smokeless tobacco. He reports that he does not drink alcohol or use drugs.  Family History:  Family History  Problem Relation Age of Onset  . Diabetes Mellitus I Mother   . Alcoholism Father   . Heart attack Sister   . Prostate cancer Brother      Prior to Admission medications   Medication Sig Start Date End Date Taking? Authorizing Provider  atorvastatin (LIPITOR) 40 MG tablet Take 40 mg by mouth daily.   Yes Historical Provider, MD  Cyanocobalamin (RA VITAMIN B-12 TR) 1000 MCG TBCR Take 1 tablet by mouth daily.    Yes Historical Provider, MD  ferrous sulfate 325 (65 FE) MG tablet Take 325 mg  by mouth daily.  09/07/15  Yes Historical Provider, MD  fluticasone (FLONASE) 50 MCG/ACT nasal spray Place 1 spray into both nostrils daily as needed for allergies.  06/03/13  Yes Historical Provider, MD  furosemide (LASIX) 40 MG tablet Take 20 mg by mouth 2 (two) times daily.    Yes Historical Provider, MD  insulin aspart (NOVOLOG) 100 UNIT/ML FlexPen Take 6 units with breakfast, 8 units with lunch, 10 units with supper. 08/23/15  Yes  Historical Provider, MD  Insulin Glargine (LANTUS SOLOSTAR) 100 UNIT/ML Solostar Pen Inject 26 Units into the skin at bedtime.  05/03/15  Yes Historical Provider, MD  lisinopril (PRINIVIL,ZESTRIL) 5 MG tablet Take 5 mg by mouth daily. 10/13/15  Yes Historical Provider, MD  metFORMIN (GLUCOPHAGE) 500 MG tablet Take 500 mg by mouth 2 (two) times daily with a meal.    Yes Historical Provider, MD  metoprolol (LOPRESSOR) 50 MG tablet Take 50 mg by mouth daily.  10/13/15  Yes Historical Provider, MD  nitroGLYCERIN (NITROSTAT) 0.4 MG SL tablet take 1 tablet under the tongue every 5 minutes if needed for chest pain 10/29/14  Yes Historical Provider, MD  omeprazole (PRILOSEC) 40 MG capsule Take 40 mg by mouth daily.   Yes Historical Provider, MD  RA ASPIRIN EC ADULT LOW ST 81 MG EC tablet Take 81 mg by mouth daily.  09/07/15  Yes Historical Provider, MD    Physical Exam: Vitals:   10/18/15 2030 10/18/15 2100  BP: (!) 131/55 (!) 144/46  Pulse: 68 68  Resp:  (!) 21  Temp: 98.5 F (36.9 C)   TempSrc: Oral   SpO2: 98% 99%  Weight: 88.5 kg (195 lb 1.7 oz)   Height: 5\' 7"  (1.702 m)    General: Not in acute distress. Pale looking. HEENT:       Eyes: PERRL, EOMI, no scleral icterus.       ENT: No discharge from the ears and nose, no pharynx injection, no tonsillar enlargement.        Neck: No JVD, no bruit, no mass felt. Heme: No neck lymph node enlargement. Cardiac: G2/E3, RRR, 2/6 systolic murmurs, No gallops or rubs. Respiratory:  No rales, wheezing, rhonchi or rubs. GI: Soft, nondistended, nontender, no rebound pain, no organomegaly, BS present. Has rectal bleeding. GU: No hematuria Ext: No pitting leg edema bilaterally. 2+DP/PT pulse bilaterally. Musculoskeletal: No joint deformities, No joint redness or warmth, no limitation of ROM in spin. Skin: No rashes.  Neuro: Alert, oriented X3, cranial nerves II-XII grossly intact, moves all extremities normally.  Psych: Patient is not psychotic, no  suicidal or hemocidal ideation.  Labs on Admission: I have personally reviewed following labs and imaging studies  CBC:  Recent Labs Lab 10/18/15 1634  WBC 11.5*  HGB 7.8*  HCT 24.0*  MCV 82.8  PLT 98*   Basic Metabolic Panel:  Recent Labs Lab 10/18/15 1634  NA 141  K 4.4  CL 109  CO2 27  GLUCOSE 126*  BUN 38*  CREATININE 1.84*  CALCIUM 8.7*   GFR: Estimated Creatinine Clearance: 33.4 mL/min (by C-G formula based on SCr of 1.84 mg/dL (H)). Liver Function Tests:  Recent Labs Lab 10/18/15 1634  AST 19  ALT 14*  ALKPHOS 69  BILITOT 0.4  PROT 6.2*  ALBUMIN 3.2*   No results for input(s): LIPASE, AMYLASE in the last 168 hours. No results for input(s): AMMONIA in the last 168 hours. Coagulation Profile:  Recent Labs Lab 10/18/15 1634  INR 1.06  Cardiac Enzymes:  Recent Labs Lab 10/18/15 1634  TROPONINI 0.03*   BNP (last 3 results) No results for input(s): PROBNP in the last 8760 hours. HbA1C: No results for input(s): HGBA1C in the last 72 hours. CBG: No results for input(s): GLUCAP in the last 168 hours. Lipid Profile: No results for input(s): CHOL, HDL, LDLCALC, TRIG, CHOLHDL, LDLDIRECT in the last 72 hours. Thyroid Function Tests: No results for input(s): TSH, T4TOTAL, FREET4, T3FREE, THYROIDAB in the last 72 hours. Anemia Panel: No results for input(s): VITAMINB12, FOLATE, FERRITIN, TIBC, IRON, RETICCTPCT in the last 72 hours. Urine analysis:    Component Value Date/Time   COLORURINE Yellow 07/11/2012 2124   APPEARANCEUR Clear 07/11/2012 2124   LABSPEC 1.013 07/11/2012 2124   PHURINE 5.0 07/11/2012 2124   GLUCOSEU Negative 07/11/2012 2124   HGBUR Negative 07/11/2012 2124   BILIRUBINUR Negative 07/11/2012 2124   KETONESUR Negative 07/11/2012 2124   PROTEINUR Negative 07/11/2012 2124   NITRITE Negative 07/11/2012 2124   LEUKOCYTESUR Negative 07/11/2012 2124   Sepsis Labs: @LABRCNTIP (procalcitonin:4,lacticidven:4) )No results found  for this or any previous visit (from the past 240 hour(s)).   Radiological Exams on Admission: No results found.   EKG: Independently reviewed.  Sinus rhythm, QTC 476, respiratory axis deviation, old right bundle blockage, T-wave flattening   Assessment/Plan Principal Problem:   GIB (gastrointestinal bleeding) Active Problems:   Benign essential HTN   Arteriosclerosis of coronary artery   Controlled type 2 diabetes mellitus without complication (HCC)   Acid reflux   Cerebrovascular accident (CVA) ([place])   CKD (chronic kidney disease), stage III   Chronic systolic CHF (congestive heart failure) (HCC)   Stroke (cerebrum) (HCC)   Blood loss anemia   GIB (gastrointestinal bleeding): pt has painless rectal bleeding and hx of diverticulosis, indicating possible lower GI bleeding from diverticulosis. Patient is on aspirin, but not on other blood thinner. He has hx of AVM.  He seems to have had EDG and colonoscopy 10/10/12 since he had biopsy results showing minimal chronic gastritis, and benign cecal and sigmoid polyp on 10/10/12. Hemoglobin dropped from 12.1-->7.8. Patient is asymptomatic with generalized weakness and dizziness, but hemodynamically stable currently. GI, Dr. Hilarie Fredrickson was consulted, will see patient in morning.  - will admit to SDU as inpt - GI consulted by Ed, will follow up recommendations - NPO - Transfuse 1 unit of blood now - continue IV pantoprazole  Gtt - Zofran IV for nausea - Avoid NSAIDs and SQ heparin - Maintain IV access (2 large bore IVs if possible). - Monitor closely and follow q6h cbc, transfuse as necessary. - LaB: INR, PTT - gentle IVF: NS 75 cc/h for 6 hours  Benign essential HTN:  -continue home lisinopril and metoprolol -hold lasix -IV hydralazine when necessary   Elevated trop and arteriosclerosis of coronary artery: s/p of CBAG and stent. Trop 0.03. No CP or SOB. Likely due to demanding ischemia secondary to anemia. Patient had Myoview on 10/12/15  at [facility] which showed hypokinesis of inferior wall with EF of 43% - cycle CE q6 x3 and repeat her EKG in the am  - prn Nitroglycerin, prn Morphine if develops CP - continue lipitor and metoprolol - hold aspirin - Risk factor stratification: will check FLP and A1C  - will not do 2d echo since pt just had one on 10/12/15 at [facility] which showed EF of 40%.  DM-II: Last A1c 8.1 on 08/16/15, poorly controled. Patient is taking metformin, NovoLog and Lantus at home -will decrease Lantus dose  from 26 units to 18 units daily  -SSI  GERD: -on Protonix gtt now  Hx of Cerebrovascular accident (CVA) ([place]): -Hold ASA  -continue lipitor  CKD (chronic kidney disease), stage III: stable. Baseline creatinine 1.5-2.0, his creatinine is 1.84, BUN is 38 -gentle IVF while on NPO as above -f/u renal fx by BMP  Chronic systolic CHF (congestive heart failure) ([place]): 2-D echo on 10/16/15 showed EF 40%. Patient does not have leg edema or JVD. CHF is compensated on admission. -Hold Lasix while patient is on nothing by mouth -Check BNP -Continue metoprolol  DVT ppx: SCD Code Status: Full code Family Communication: None at bed side.  Disposition Plan:  Anticipate discharge back to previous home environment Consults called:  GI Admission status:  SDU/inpation       Date of Service 10/18/2015    Ivor Costa [facility] Pager [phone]  If 7PM-7AM, please contact night-coverage [email or web address] Password [facility] 10/18/2015, 10:29 PM

## 2015-10-19 ENCOUNTER — Encounter (HOSPITAL_COMMUNITY): Payer: Self-pay

## 2015-10-19 DIAGNOSIS — D500 Iron deficiency anemia secondary to blood loss (chronic): Secondary | ICD-10-CM

## 2015-10-19 DIAGNOSIS — K2901 Acute gastritis with bleeding: Secondary | ICD-10-CM

## 2015-10-19 DIAGNOSIS — K625 Hemorrhage of anus and rectum: Secondary | ICD-10-CM

## 2015-10-19 LAB — LIPID PANEL
CHOL/HDL RATIO: 3.1 ratio
CHOLESTEROL: 91 mg/dL (ref 0–200)
HDL: 29 mg/dL — AB (ref >40)
LDL CALC: 41 mg/dL (ref 0–99)
TRIGLYCERIDES: 105 mg/dL (ref <150)
VLDL: 21 mg/dL (ref 0–40)

## 2015-10-19 LAB — CBC
HCT: 26.8 % — ABNORMAL LOW (ref 39.0–52.0)
HEMATOCRIT: 28.2 % — AB (ref 39.0–52.0)
HEMATOCRIT: 23.1 % — AB (ref 39.0–52.0)
HEMOGLOBIN: 9.1 g/dL — AB (ref 13.0–17.0)
HEMOGLOBIN: 7.4 g/dL — AB (ref 13.0–17.0)
Hemoglobin: 8.6 g/dL — ABNORMAL LOW (ref 13.0–17.0)
MCH: 27.1 pg (ref 26.0–34.0)
MCH: 27.4 pg (ref 26.0–34.0)
MCH: 27.2 pg (ref 26.0–34.0)
MCHC: 32.1 g/dL (ref 30.0–36.0)
MCHC: 32.3 g/dL (ref 30.0–36.0)
MCHC: 32 g/dL (ref 30.0–36.0)
MCV: 84.5 fL (ref 78.0–100.0)
MCV: 84.9 fL (ref 78.0–100.0)
MCV: 84.9 fL (ref 78.0–100.0)
PLATELETS: 91 10*3/uL — AB (ref 150–400)
Platelets: 117 10*3/uL — ABNORMAL LOW (ref 150–400)
Platelets: UNDETERMINED 10*3/uL (ref 150–400)
RBC: 3.17 MIL/uL — AB (ref 4.22–5.81)
RBC: 3.32 MIL/uL — AB (ref 4.22–5.81)
RBC: 2.72 MIL/uL — AB (ref 4.22–5.81)
RDW: 14.3 % (ref 11.5–15.5)
RDW: 14.3 % (ref 11.5–15.5)
RDW: 14.5 % (ref 11.5–15.5)
WBC: 9.7 10*3/uL (ref 4.0–10.5)
WBC: 10.4 10*3/uL (ref 4.0–10.5)
WBC: 8.7 10*3/uL (ref 4.0–10.5)

## 2015-10-19 LAB — GLUCOSE, CAPILLARY
GLUCOSE-CAPILLARY: 105 mg/dL — AB (ref 65–99)
GLUCOSE-CAPILLARY: 111 mg/dL — AB (ref 65–99)
GLUCOSE-CAPILLARY: 215 mg/dL — AB (ref 65–99)
GLUCOSE-CAPILLARY: 219 mg/dL — AB (ref 65–99)

## 2015-10-19 LAB — BASIC METABOLIC PANEL
Anion gap: 9 (ref 5–15)
BUN: 32 mg/dL — AB (ref 6–20)
CHLORIDE: 109 mmol/L (ref 101–111)
CO2: 24 mmol/L (ref 22–32)
Calcium: 8.3 mg/dL — ABNORMAL LOW (ref 8.9–10.3)
Creatinine, Ser: 1.69 mg/dL — ABNORMAL HIGH (ref 0.61–1.24)
GFR calc Af Amer: 42 mL/min — ABNORMAL LOW (ref >60)
GFR calc non Af Amer: 36 mL/min — ABNORMAL LOW (ref >60)
Glucose, Bld: 107 mg/dL — ABNORMAL HIGH (ref 65–99)
POTASSIUM: 3.9 mmol/L (ref 3.5–5.1)
SODIUM: 142 mmol/L (ref 135–145)

## 2015-10-19 LAB — TROPONIN I
Troponin I: 0.03 ng/mL (ref <0.03)
Troponin I: 0.03 ng/mL (ref <0.03)

## 2015-10-19 LAB — PREPARE RBC (CROSSMATCH)

## 2015-10-19 MED ORDER — SODIUM CHLORIDE 0.9 % IV SOLN
Freq: Once | INTRAVENOUS | Status: AC
Start: 2015-10-19 — End: 2015-10-20
  Administered 2015-10-20: 23:00:00 via INTRAVENOUS

## 2015-10-19 NOTE — Progress Notes (Signed)
Patient ID: Ryan Mcclure, male   DOB: 1931-08-17, 82 y.o.   MRN: [id number]    PROGRESS NOTE    Ryan Mcclure  TIW:[id number] DOB: 12-Aug-1931 DOA: 10/18/2015  PCP: Maryland Pink, MD   Brief Narrative:  82 y.o. male with medical history significant of GIB, AVM, diverticulosis, hypertension, hyperlipidemia, diabetes mellitus, CAD, s/p of CABG, s/p of sent, s/p of AVR with bioprosthetic valve, PVD, s/p of bilateral carotid artery endarterectomy, CKD-III, who presented with rectal bleeding and generalized weakness.  Assessment & Plan:   Acute blood loss anemia due to GIB (gastrointestinal bleeding), thrombocytopenia  - persistent blood stools, Hg repeat down from 9 --> 7.4, will need to transfuse two U PRBC - GI team following - keep in SDU and monitor  - may need further endoscopic interventions, keep on clear liquids  - CBC again in am   Essential HTN - continue home lisinopril and metoprolol - hold lasix  Elevated trop and arteriosclerosis of coronary artery - s/p of CBAG and stent. Trop 0.03. No CP or SOB.  - Likely due to demanding ischemia secondary to anemia.  - Patient had Myoview on 10/12/15 at [facility] which showed hypokinesis of inferior wall with EF of 43% - keep on tele   DM-II with complications of nephropathy  - Last A1c 8.1 on 08/16/15, poorly controled. Patient is taking metformin, NovoLog and Lantus at home - continue Lantus 18 units daily  - continue SSI  GERD: - on PPI for now   Hx of Cerebrovascular accident (CVA) ([place]): -Hold ASA  -continue lipitor  CKD (chronic kidney disease), stage III - stable. Baseline creatinine 1.5-2.0, his creatinine is down from 1.8 --> 1.6 - BMP in AM  Obesity - Body mass index is 30.94 kg/m.  Chronic systolic CHF (congestive heart failure) ([place]) - 2-D echo on 10/16/15 showed EF 40%. Patient does not have leg edema or JVD. CHF is compensated  - monitor daily weights   DVT prophylaxis: SCD Code Status: Full    Family Communication: Patient at bedside  Disposition Plan: Keep in SDU  Consultants:   GI  Procedures:   None  Antimicrobials:   None   Subjective: Still with blood bowel movements.   Objective: Vitals:   10/19/15 0854 10/19/15 1000 10/19/15 1200 10/19/15 1600  BP: (!) 185/68 (!) 148/57 (!) 154/62 (!) 170/82  Pulse: 73 64 (!) 31 69  Resp:  15 14 16   Temp:   98.4 F (36.9 C) 98.7 F (37.1 C)  TempSrc:   Oral Oral  SpO2:  98% 96% 100%  Weight:      Height:        Intake/Output Summary (Last 24 hours) at 10/19/15 1910 Last data filed at 10/19/15 1820  Gross per 24 hour  Intake             1150 ml  Output             1775 ml  Net             -625 ml   Filed Weights   10/18/15 2030 10/19/15 0500  Weight: 88.5 kg (195 lb 1.7 oz) 89.6 kg (197 lb 8.5 oz)    Examination:  General exam: Appears calm and comfortable  Respiratory system: Clear to auscultation. Respiratory effort normal. Cardiovascular system: S1 & S2 heard, RRR. No JVD, murmurs, rubs, gallops or clicks. No pedal edema. Gastrointestinal system: Abdomen is nondistended, soft and nontender. No organomegaly or masses felt.  Central nervous  system: Alert and oriented. No focal neurological deficits.  Data Reviewed: I have personally reviewed following labs and imaging studies  CBC:  Recent Labs Lab 10/18/15 1634 10/18/15 2254 10/19/15 0609 10/19/15 1209  WBC 11.5* 11.2* 9.7 10.4  HGB 7.8* 7.8* 8.6* 9.1*  HCT 24.0* 24.9* 26.8* 28.2*  MCV 82.8 86.5 84.5 84.9  PLT 98* 107* 91* 270*   Basic Metabolic Panel:  Recent Labs Lab 10/18/15 1634 10/19/15 0609  NA 141 142  K 4.4 3.9  CL 109 109  CO2 27 24  GLUCOSE 126* 107*  BUN 38* 32*  CREATININE 1.84* 1.69*  CALCIUM 8.7* 8.3*   Liver Function Tests:  Recent Labs Lab 10/18/15 1634  AST 19  ALT 14*  ALKPHOS 69  BILITOT 0.4  PROT 6.2*  ALBUMIN 3.2*   Coagulation Profile:  Recent Labs Lab 10/18/15 1634  INR 1.06   Cardiac  Enzymes:  Recent Labs Lab 10/18/15 1634 10/18/15 2254 10/19/15 0609 10/19/15 1209  TROPONINI 0.03* 0.03* 0.03* 0.03*   CBG:  Recent Labs Lab 10/18/15 2235 10/19/15 0723 10/19/15 1129 10/19/15 1638  GLUCAP 179* 105* 111* 215*   Lipid Profile:  Recent Labs  10/19/15 0609  CHOL 91  HDL 29*  LDLCALC 41  TRIG 105  CHOLHDL 3.1   Urine analysis:    Component Value Date/Time   COLORURINE Yellow 07/11/2012 2124   APPEARANCEUR Clear 07/11/2012 2124   LABSPEC 1.013 07/11/2012 2124   PHURINE 5.0 07/11/2012 2124   GLUCOSEU Negative 07/11/2012 2124   HGBUR Negative 07/11/2012 2124   BILIRUBINUR Negative 07/11/2012 2124   KETONESUR Negative 07/11/2012 2124   PROTEINUR Negative 07/11/2012 2124   NITRITE Negative 07/11/2012 2124   LEUKOCYTESUR Negative 07/11/2012 2124    Recent Results (from the past 240 hour(s))  MRSA PCR Screening     Status: None   Collection Time: 10/18/15  8:51 PM  Result Value Ref Range Status   MRSA by PCR NEGATIVE NEGATIVE Final    Comment:        The GeneXpert MRSA Assay (FDA approved for NASAL specimens only), is one component of a comprehensive MRSA colonization surveillance program. It is not intended to diagnose MRSA infection nor to guide or monitor treatment for MRSA infections.       Radiology Studies: No results found.    Scheduled Meds: . sodium chloride   Intravenous Once  . atorvastatin  40 mg Oral Daily  . ferrous sulfate  325 mg Oral Daily  . insulin aspart  0-9 Units Subcutaneous TID WC  . insulin glargine  18 Units Subcutaneous QHS  . lisinopril  5 mg Oral Daily  . metoprolol  50 mg Oral Daily  . sodium chloride flush  3 mL Intravenous Q12H  . vitamin B-12  1,000 mcg Oral Daily   Continuous Infusions: . pantoprozole (PROTONIX) infusion 8 mg/hr (10/19/15 1649)     LOS: 1 day    Time spent: 20 minutes    Faye Ramsay, MD [facility] Pager [phone]  If 7PM-7AM, please contact  night-coverage [email or web address] Password [email or web address] 10/19/2015, 7:10 PM

## 2015-10-19 NOTE — Progress Notes (Signed)
Dr. Ardis Hughs notified of patients rectal bleeding of 250 cc. Dr. Ardis Hughs requesting GI  records be faxed from [facility] and  [facility].

## 2015-10-19 NOTE — Progress Notes (Signed)
Per Dr. Ardis Hughs cancel request for records.

## 2015-10-19 NOTE — Care Management Note (Signed)
Case Management Note  Patient Details  Name: KELSO BIBBY MRN: [id number] Date of Birth: 06/23/1931  Subjective/Objective:      Anemia               Action/Plan: lives in [place] return to home   Expected Discharge Date:  10/21/15               Expected Discharge Plan:  Home/Self Care  In-House Referral:     Discharge planning Services     Post Acute Care Choice:    Choice offered to:     DME Arranged:    DME Agency:     HH Arranged:    [place] Agency:     Status of Service:  In process, will continue to follow  If discussed at Long Length of Stay Meetings, dates discussed:    Additional Comments:Date:  October 19, 2015 Chart reviewed for concurrent status and case management needs. Will continue to follow the patient for status change: Discharge Planning: following for needs Expected discharge date: [id number] Velva Harman, BSN, [place], [place]  Leeroy Cha, RN 10/19/2015, 8:55 AM

## 2015-10-19 NOTE — Progress Notes (Signed)
Patient had passed a bloody stool 150 cc.

## 2015-10-19 NOTE — Consult Note (Addendum)
Consultation  Referring Provider:  Dr. Doyle Askew    Primary Care Physician:  [place] Pink, MD Primary Gastroenterologist:  Althia Forts       Reason for Consultation: GI Bleed         HPI:   Ryan Mcclure is a 82 y.o. male with past med hx significant for GIB, AVM, diverticulosis, HTN, HLD, DM, CAD, s/p CABG, s/p stent, s/p AVR with biprosthetic valve, PVD, s/p b/l carotid artery endarterectomy, CKD-III, who presented to the ER on 10/18/15 with rectal bleeding and generalized weakness.  Today, the patient explains that he started bleeding on Saturday night/Sunday and has continued with episodes of bright red blood until around 8:00 last night at time of admission. He is a poor historian, but tries to recall details. He tells me that this was "somewhat continuous", he does express that he was having diarrheal stool with this bright red blood, it seemed to increase and worsen on Monday and has continued constantly since then. He notes that he lives "at a place with nurses", and yesterday began feeling weak, almost like he had "low sugar", but his glucose was normal and his stomach began to feel "queasy and shaky", with no obvious abdominal pain and he called his nurse who then called an ambulance that sent him to [facility]. He was then transferred here after a finding of a 5 point drop in hemoglobin from previous. The patient tells me that he has a history of bleeding that they have blamed on "diverticulitis", because they "have to call at something", in the past. The patient describes going through multiple procedures including an EGD and colonoscopy "a couple years ago", as well as what sounds like a pill capsule endoscopy. He is unable to tell me direct findings of these procedures, but continues to explain that they "blamed it on diverticulitis". Patient tells me his last episode like this was a year ago and after a multiple day stay in the hospital this "went away on its own".  Patient does admit to being slightly constipated before this episode started with no bowel movement for 2-3 days prior. He denies laxative use.  Past medical history is significant for a stress test on the 13th of last week, which the patient feels started all his symptoms. This was normal per the patient. Patient is also on ASA 81 mg for his history of various heart conditions as noted above.  Patient denies fever, chills, heartburn, reflux, abdominal pain, use of NSAIDs, dizziness, shortness of breath or syncope.  ED Course: pt was found to have  hemoglobin dropped from 12.1 on 08/09/15-->7.8, INR 1.06, positive trop 0.03, WBC 11.5, platelets 98, stable renal function, temperature normal, no tachycardia, blood pressure 144/46  Past Medical History:  Diagnosis Date  . Chronic systolic CHF (congestive heart failure) ([place])   . CKD (chronic kidney disease), stage III   . Diabetes ([place])   . HBP (high blood pressure)   . Heart attack ([place])   . High cholesterol   . Stroke (cerebrum) ([place])   . Swelling     Past Surgical History:  Procedure Laterality Date  . EXPLORATION POST OPERATIVE OPEN HEART    . EYE SURGERY Bilateral     Family History  Problem Relation Age of Onset  . Diabetes Mellitus I Mother   . Alcoholism Father   . Heart attack Sister   . Prostate cancer Brother     Social History  Substance Use Topics  .  Smoking status: Former Smoker    Types: Cigarettes    Quit date: 01/30/1995  . Smokeless tobacco: Never Used  . Alcohol use No    Prior to Admission medications   Medication Sig Start Date End Date Taking? Authorizing Provider  atorvastatin (LIPITOR) 40 MG tablet Take 40 mg by mouth daily.   Yes Historical Provider, MD  Cyanocobalamin (RA VITAMIN B-12 TR) 1000 MCG TBCR Take 1 tablet by mouth daily.    Yes Historical Provider, MD  ferrous sulfate 325 (65 FE) MG tablet Take 325 mg by mouth daily.  09/07/15  Yes Historical Provider, MD  fluticasone (FLONASE) 50 MCG/ACT  nasal spray Place 1 spray into both nostrils daily as needed for allergies.  06/03/13  Yes Historical Provider, MD  furosemide (LASIX) 40 MG tablet Take 20 mg by mouth 2 (two) times daily.    Yes Historical Provider, MD  insulin aspart (NOVOLOG) 100 UNIT/ML FlexPen Take 6 units with breakfast, 8 units with lunch, 10 units with supper. 08/23/15  Yes Historical Provider, MD  Insulin Glargine (LANTUS SOLOSTAR) 100 UNIT/ML Solostar Pen Inject 26 Units into the skin at bedtime.  05/03/15  Yes Historical Provider, MD  lisinopril (PRINIVIL,ZESTRIL) 5 MG tablet Take 5 mg by mouth daily. 10/13/15  Yes Historical Provider, MD  metFORMIN (GLUCOPHAGE) 500 MG tablet Take 500 mg by mouth 2 (two) times daily with a meal.    Yes Historical Provider, MD  metoprolol (LOPRESSOR) 50 MG tablet Take 50 mg by mouth daily.  10/13/15  Yes Historical Provider, MD  nitroGLYCERIN (NITROSTAT) 0.4 MG SL tablet take 1 tablet under the tongue every 5 minutes if needed for chest pain 10/29/14  Yes Historical Provider, MD  omeprazole (PRILOSEC) 40 MG capsule Take 40 mg by mouth daily.   Yes Historical Provider, MD  RA ASPIRIN EC ADULT LOW ST 81 MG EC tablet Take 81 mg by mouth daily.  09/07/15  Yes Historical Provider, MD    Current Facility-Administered Medications  Medication Dose Route Frequency Provider Last Rate Last Dose  . 0.9 %  sodium chloride infusion   Intravenous Once Ivor Costa, MD      . acetaminophen (TYLENOL) tablet 650 mg  650 mg Oral Q6H PRN Ivor Costa, MD       Or  . acetaminophen (TYLENOL) suppository 650 mg  650 mg Rectal Q6H PRN Ivor Costa, MD      . atorvastatin (LIPITOR) tablet 40 mg  40 mg Oral Daily Ivor Costa, MD      . ferrous sulfate tablet 325 mg  325 mg Oral Daily Ivor Costa, MD   325 mg at 10/18/15 2258  . fluticasone (FLONASE) 50 MCG/ACT nasal spray 1 spray  1 spray Each Nare Daily PRN Ivor Costa, MD      . hydrALAZINE (APRESOLINE) injection 5 mg  5 mg Intravenous Q2H PRN Ivor Costa, MD      . insulin aspart  (novoLOG) injection 0-9 Units  0-9 Units Subcutaneous TID WC Ivor Costa, MD      . insulin glargine (LANTUS) injection 18 Units  18 Units Subcutaneous QHS Ivor Costa, MD   18 Units at 10/18/15 2247  . lisinopril (PRINIVIL,ZESTRIL) tablet 5 mg  5 mg Oral Daily Ivor Costa, MD      . metoprolol tartrate (LOPRESSOR) tablet 50 mg  50 mg Oral Daily Ivor Costa, MD      . morphine 2 MG/ML injection 2 mg  2 mg Intravenous Q4H PRN Ivor Costa, MD      .  nitroGLYCERIN (NITROSTAT) SL tablet 0.4 mg  0.4 mg Sublingual Q5 min PRN Ivor Costa, MD      . ondansetron [facility]) injection 4 mg  4 mg Intravenous Q8H PRN Ivor Costa, MD      . pantoprazole (PROTONIX) 80 mg in sodium chloride 0.9 % 250 mL (0.32 mg/mL) infusion  8 mg/hr Intravenous Continuous Ivor Costa, MD 25 mL/hr at 10/19/15 0528 8 mg/hr at 10/19/15 0528  . sodium chloride flush (NS) 0.9 % injection 3 mL  3 mL Intravenous Q12H Ivor Costa, MD      . vitamin B-12 (CYANOCOBALAMIN) tablet 1,000 mcg  1,000 mcg Oral Daily Ivor Costa, MD        Allergies as of 10/18/2015 - Review Complete 10/18/2015  Allergen Reaction Noted  . Amlodipine Nausea And Vomiting 12/07/2014  . Neuromuscular blocking agents Nausea And Vomiting 12/07/2014     Review of Systems:    Constitutional: Positive for fatigue No weight loss, fever or chills HEENT: Eyes: No change in vision               Ears, Nose, Throat:  No change in hearing or congestion Skin: No rash or itching Cardiovascular: No chest pain, chest pressure or palpitations   Respiratory: No SOB or cough Gastrointestinal: See HPI and otherwise negative Genitourinary: No dysuria or change in urinary frequency Neurological: No headache, dizziness or syncope Musculoskeletal: No new muscle or joint pain Hematologic: Positive for hematochezia  Psychiatric: No history of depression or anxiety   Physical Exam:  Vital signs in last 24 hours: Temp:  [97.7 F (36.5 C)-98.6 F (37 C)] 97.9 F (36.6 C) (09/20 0800) Pulse Rate:   [62-73] 64 (09/20 0700) Resp:  [13-24] 14 (09/20 0700) BP: (113-200)/(44-70) 200/67 (09/20 0700) SpO2:  [96 %-100 %] 98 % (09/20 0700) Weight:  [195 lb 1.7 oz (88.5 kg)-200 lb (90.7 kg)] 197 lb 8.5 oz (89.6 kg) (09/20 0500) Last BM Date: 10/18/15 General:   Pleasant African-American male appears to be in NAD, Well developed, Well nourished, alert and cooperative Head:  Normocephalic and atraumatic. Eyes:   PEERL, EOMI. No icterus. Conjunctiva pink. Ears:  Normal auditory acuity. Neck:  Supple Throat: Oral cavity and pharynx without inflammation, swelling or lesion.  Lungs: Respirations even and unlabored. Lungs clear to auscultation bilaterally.   No wheezes, crackles, or rhonchi.  Heart: Normal S1, S2. 2/6 systolic murmur Regular rate and rhythm. No peripheral edema, cyanosis or pallor.  Abdomen:  Soft, nondistended, nontender. No rebound or guarding. Normal bowel sounds. No appreciable masses or hepatomegaly. Rectal:  Not performed.  Msk:  Symmetrical without gross deformities. Extremities:  Without edema, no deformity or joint abnormality.  Neurologic:  Alert and  oriented x4;  grossly normal neurologically.  Skin:   Dry and intact without significant lesions or rashes. Psychiatric: Oriented to person, place and time. Demonstrates good judgement and reason without abnormal affect or behaviors.   LAB RESULTS:  Recent Labs  10/18/15 1634 10/18/15 2254 10/19/15 0609  WBC 11.5* 11.2* 9.7  HGB 7.8* 7.8* 8.6*  HCT 24.0* 24.9* 26.8*  PLT 98* 107* 91*   BMET  Recent Labs  10/18/15 1634 10/19/15 0609  NA 141 142  K 4.4 3.9  CL 109 109  CO2 27 24  GLUCOSE 126* 107*  BUN 38* 32*  CREATININE 1.84* 1.69*  CALCIUM 8.7* 8.3*   LFT  Recent Labs  10/18/15 1634  PROT 6.2*  ALBUMIN 3.2*  AST 19  ALT 14*  ALKPHOS 69  BILITOT 0.4   PT/INR  Recent Labs  10/18/15 1634  LABPROT 13.8  INR 1.06    STUDIES: No results found.   PREVIOUS ENDOSCOPIES:            Done  in [place] per patient: We do have pathology report from 10/10/2012 which shows oxyntic mucosa with minimal chronic gastritis and increase in parietal size with apical protrusions negative for H. pylori or dysplasia from the body of the stomach, antral mucosa with mild foveolar hyperplasia with an mild chronic gastritis negative for H. pylori or dysplasia and a tubular adenoma found in the cecum negative for high-grade dysplasia as well as a mid sigmoid polyp that was hyperplastic negative for dysplasia and a rectal polyp hyperplastic negative for dysplasia   Impression / Plan:   Impression: 1. GI Bleed: painless rectal bleeding x3d, h/o diverticulosis and previous diverticular bleeds, last a year ago per pt, on ASA 81mg , h.o AVM, most recent EGD/[facility] 10/10/12-bx showing minimal chronic gastritis and benign cecal and sigmoid polyp (no report from procedures, just path), hgb 12.1-->7.8 at time of admission, up to 8.6 after 1 unit prbcs; Likely this represents diverticular bleed 2. Elevated troponin: 0.03 x2, last EF 43% 10/12/15- Consider relation to recent stress test? Done Sept 13th 3. GERD: controlled on Protonix 4. H/o CVA: holding ASA 5. CKD-III 6. CHF: last EF 43% 10/12/15  Plan: 1. Agree with monitoring hgb q6h with transfusion as necessary 2. No need for emergent procedures at this time as patient is not actively bleeding, if signs of brisk active bleeding would recommend a bleeding scan 3. Patient should remain nothing by mouth this morning, may change to clear liquids later today 4. Continue supportive measures 5. Will discuss above with Dr. Ardis Hughs, please await any further recommendations  Thank you for your kind consultation, we will continue to follow.  Ryan Mcclure [facility]  10/19/2015, 8:47 AM Pager #: [phone]   ________________________________________________________________________  Velora Heckler GI MD note:  I personally examined the patient, reviewed the data and agree  with the assessment and plan described above.  Lower GI bleeding that seems to be slowing or stopping with time, observation.  He tells me he's had numerous tests for this same problem in the past; I cannot be sure where they were done but he thinks [facility] has done colonoscopy, EGD and capsule testing and "in the end they blamed diverticular disease." He is pretty sure these tests were all done since 2013.  I cannot find any records of those tests in [place] or in limited access to [facility] records available in [place].  He was transferred from [facility] last night because there was no GI coverage per ER report.  It would be helpful to get his records here for review and we will work towards that so that no tests have to be repeated unnecessarily.    Ryan Loffler, MD [facility] Pager [phone]  Addendum: I spoke with [facility] staff (in [employer]): he had a colonoscopy in 2014 with Dr. Gustavo Lah; one small polyp and sigmoid diverticulosis.  He had EGD in 2014 with Dr. Gustavo Lah; gastritis.  These were done for GI bleeding, IDA.

## 2015-10-19 NOTE — Evaluation (Signed)
Physical Therapy Evaluation Patient Details Name: Ryan Mcclure MRN: [id number] DOB: [birth year] Today's Date: 10/19/2015   History of Present Illness  ANANDA SITZER is a 82 y.o. male with medical history significant of GIB, AVM, diverticulosis, hypertension, hyperlipidemia, diabetes mellitus, CAD, s/p of CABG, s/p of sent, s/p of AVR with bioprosthetic valve, PVD, s/p of bilateral carotid artery endarterectomy, CKD-III, who presents with rectal bleeding and generalized weakness  Clinical Impression  The patient is  Feeling well, a little weak. The patient should progress  To  A level  To Discharge to home. Pt admitted with above diagnosis. Pt currently with functional limitations due to the deficits listed below (see PT Problem List).  Pt will benefit from skilled PT to increase their independence and safety with mobility to allow discharge to the venue listed below.       Follow Up Recommendations Home health PT;Supervision/Assistance - 24 hour    Equipment Recommendations   (TBD)    Recommendations for Other Services       Precautions / Restrictions Precautions Precautions: Fall Precaution Comments: high BP      Mobility  Bed Mobility Overal bed mobility: Needs Assistance Bed Mobility: Supine to Sit     Supine to sit: Min assist     General bed mobility comments: assist trunk  Transfers Overall transfer level: Needs assistance Equipment used: Rolling walker (2 wheeled) Transfers: Sit to/from [facility] Sit to Stand: Min assist Stand pivot transfers: Min assist       General transfer comment: cues for safety  Ambulation/Gait                Stairs            Wheelchair Mobility    Modified Rankin (Stroke Patients Only)       Balance   Sitting-balance support: Feet supported;No upper extremity supported Sitting balance-Leahy Scale: Normal     Standing balance support: During functional activity;No upper extremity  supported Standing balance-Leahy Scale: Fair                               Pertinent Vitals/Pain Pain Assessment: No/denies pain    Home Living Family/patient expects to be discharged to:: Assisted living [facility])               Home Equipment: Kasandra Knudsen - single point      Prior Function Level of Independence: Independent with assistive device(s)               Hand Dominance        Extremity/Trunk Assessment   Upper Extremity Assessment: Defer to OT evaluation           Lower Extremity Assessment: Generalized weakness      Cervical / Trunk Assessment: Normal  Communication   Communication: No difficulties  Cognition Arousal/Alertness: Awake/alert Behavior During Therapy: WFL for tasks assessed/performed Overall Cognitive Status: Within Functional Limits for tasks assessed                      General Comments      Exercises     Assessment/Plan    PT Assessment Patient needs continued PT services  PT Problem List Decreased strength;Decreased activity tolerance;Decreased mobility;Cardiopulmonary status limiting activity;Decreased knowledge of precautions;Decreased safety awareness;Decreased knowledge of use of DME          PT Treatment Interventions DME instruction;Gait training;Functional mobility training;Therapeutic activities;Therapeutic exercise;Patient/family  education    PT Goals (Current goals can be found in the Care Plan section)  Acute Rehab PT Goals Patient Stated Goal: to  get some assistance if I need it PT Goal Formulation: With patient Time For Goal Achievement: 11/02/15 Potential to Achieve Goals: Good    Frequency Min 3X/week   Barriers to discharge        Co-evaluation               End of Session   Activity Tolerance: Patient tolerated treatment well Patient left: in chair;with call bell/phone within reach;with chair alarm set;with nursing/sitter in room Nurse Communication: Mobility  status         Time: 8527-7824 PT Time Calculation (min) (ACUTE ONLY): 26 min   Charges:   PT Evaluation $PT Eval Low Complexity: 1 Procedure PT Treatments $Therapeutic Activity: 8-22 mins   PT G Codes:        Claretha Cooper 10/19/2015, 9:54 AM Tresa Endo PT [phone]

## 2015-10-19 NOTE — Progress Notes (Signed)
Patient had a medium formed size black bloody  colored stool.

## 2015-10-19 NOTE — Progress Notes (Signed)
Patient had 250 cc of bloody stool . Will notify MD.

## 2015-10-20 DIAGNOSIS — K2961 Other gastritis with bleeding: Secondary | ICD-10-CM

## 2015-10-20 LAB — TYPE AND SCREEN
ABO/RH(D): B POS
Antibody Screen: NEGATIVE
UNIT DIVISION: 0
UNIT DIVISION: 0
UNIT DIVISION: 0

## 2015-10-20 LAB — CBC
HEMATOCRIT: 29.8 % — AB (ref 39.0–52.0)
HEMATOCRIT: 26.6 % — AB (ref 39.0–52.0)
HEMOGLOBIN: 9.8 g/dL — AB (ref 13.0–17.0)
Hemoglobin: 8.8 g/dL — ABNORMAL LOW (ref 13.0–17.0)
MCH: 28.8 pg (ref 26.0–34.0)
MCH: 27.9 pg (ref 26.0–34.0)
MCHC: 32.9 g/dL (ref 30.0–36.0)
MCHC: 33.1 g/dL (ref 30.0–36.0)
MCV: 87.6 fL (ref 78.0–100.0)
MCV: 84.4 fL (ref 78.0–100.0)
PLATELETS: 84 10*3/uL — AB (ref 150–400)
Platelets: 85 10*3/uL — ABNORMAL LOW (ref 150–400)
RBC: 3.4 MIL/uL — AB (ref 4.22–5.81)
RBC: 3.15 MIL/uL — ABNORMAL LOW (ref 4.22–5.81)
RDW: 14.6 % (ref 11.5–15.5)
RDW: 14.6 % (ref 11.5–15.5)
WBC: 12.9 10*3/uL — AB (ref 4.0–10.5)
WBC: 10 10*3/uL (ref 4.0–10.5)

## 2015-10-20 LAB — GLUCOSE, CAPILLARY
GLUCOSE-CAPILLARY: 167 mg/dL — AB (ref 65–99)
Glucose-Capillary: 146 mg/dL — ABNORMAL HIGH (ref 65–99)
Glucose-Capillary: 216 mg/dL — ABNORMAL HIGH (ref 65–99)
Glucose-Capillary: 239 mg/dL — ABNORMAL HIGH (ref 65–99)

## 2015-10-20 LAB — BASIC METABOLIC PANEL
Anion gap: 7 (ref 5–15)
BUN: 26 mg/dL — AB (ref 6–20)
CO2: 23 mmol/L (ref 22–32)
CREATININE: 1.61 mg/dL — AB (ref 0.61–1.24)
Calcium: 8.1 mg/dL — ABNORMAL LOW (ref 8.9–10.3)
Chloride: 111 mmol/L (ref 101–111)
GFR calc Af Amer: 45 mL/min — ABNORMAL LOW (ref >60)
GFR, EST NON AFRICAN AMERICAN: 38 mL/min — AB (ref >60)
GLUCOSE: 182 mg/dL — AB (ref 65–99)
Potassium: 4.5 mmol/L (ref 3.5–5.1)
SODIUM: 141 mmol/L (ref 135–145)

## 2015-10-20 LAB — HEMOGLOBIN A1C
HEMOGLOBIN A1C: 6.9 % — AB (ref 4.8–5.6)
MEAN PLASMA GLUCOSE: 151 mg/dL

## 2015-10-20 MED ORDER — PEG-KCL-NACL-NASULF-NA ASC-C 100 G PO SOLR
0.5000 | ORAL | Status: AC
  Administered 2015-10-20 – 2015-10-21 (×2): 100 g via ORAL
  Filled 2015-10-20: qty 1

## 2015-10-20 MED ORDER — SODIUM CHLORIDE 0.9 % IV SOLN: INTRAVENOUS | Status: DC

## 2015-10-20 MED ORDER — PEG-KCL-NACL-NASULF-NA ASC-C 100 G PO SOLR: 1.0000 | Freq: Once | ORAL | Status: DC

## 2015-10-20 NOTE — Evaluation (Signed)
Occupational Therapy Evaluation Patient Details Name: Ryan Mcclure MRN: [id number] DOB: Jan 29, 1932 Today's Date: 10/20/2015    History of Present Illness Ryan Mcclure is a 82 y.o. male with medical history significant of GIB, AVM, diverticulosis, hypertension, hyperlipidemia, diabetes mellitus, CAD, s/p of CABG, s/p of sent, s/p of AVR with bioprosthetic valve, PVD, s/p of bilateral carotid artery endarterectomy, CKD-III, who presents with rectal bleeding and generalized weakness   Clinical Impression   Pt was admitted for the above.  He was mod I with ADLs prior to admission, and now needs min A.  He will benefit from continued OT. Goals are for supervision level    Follow Up Recommendations  SNF    Equipment Recommendations   (defer to next venue:  likely 3:1 if toilet not high enough)    Recommendations for Other Services       Precautions / Restrictions Precautions Precautions: Fall Precaution Comments: high BP Restrictions Weight Bearing Restrictions: No      Mobility Bed Mobility         Supine to sit: Min assist     General bed mobility comments: assist for trunk and to move leg around bedrail  Transfers   Equipment used: Rolling walker (2 wheeled) Transfers: Sit to/from Stand Sit to Stand: Min assist Stand pivot transfers: Min assist       General transfer comment: cues for UE placement and min A to stand up and steady    Balance                                            ADL Overall ADL's : Needs assistance/impaired     Grooming: Set up;Sitting       Lower Body Bathing: Minimal assistance;Sit to/from stand       Lower Body Dressing: Minimal assistance;Sit to/from stand   Toilet Transfer: Minimal assistance;RW;Stand-pivot (to chair)             General ADL Comments: pt can perform UB adls with set up. He was agreeable to getting up to chair but did not want to ambulate to sink this session.  He had a vagal  response last evening.  Pt is usually independent with adls with AD.  He needs min A for sit to stand with adls     Vision     Perception     Praxis      Pertinent Vitals/Pain       Hand Dominance     Extremity/Trunk Assessment Upper Extremity Assessment Upper Extremity Assessment: Generalized weakness           Communication Communication Communication: No difficulties   Cognition Arousal/Alertness: Awake/alert Behavior During Therapy: WFL for tasks assessed/performed Overall Cognitive Status: Within Functional Limits for tasks assessed                     General Comments       Exercises       Shoulder Instructions      Home Living Family/patient expects to be discharged to:: Assisted living                             Home Equipment: Cane - single point   Additional Comments: from [place]--multiple levels of care      Prior Functioning/Environment Level of Independence:  Independent with assistive device(s)                 OT Problem List: Decreased strength;Decreased activity tolerance;Impaired balance (sitting and/or standing);Decreased safety awareness;Decreased knowledge of use of DME or AE   OT Treatment/Interventions: Self-care/ADL training;DME and/or AE instruction;Balance training;Patient/family education    OT Goals(Current goals can be found in the care plan section) Acute Rehab OT Goals Patient Stated Goal: to  get some assistance if I need it OT Goal Formulation: With patient Time For Goal Achievement: 10/27/15 Potential to Achieve Goals: Good ADL Goals Pt Will Perform Grooming: with supervision;standing Pt Will Perform Lower Body Bathing: with supervision;sit to/from stand Pt Will Perform Lower Body Dressing: with supervision;sit to/from stand Pt Will Transfer to Toilet: with supervision;bedside commode;ambulating Pt Will Perform Toileting - Clothing Manipulation and hygiene: with supervision;sit to/from  stand  OT Frequency: Min 2X/week   Barriers to D/C:            Co-evaluation              End of Session Nurse Communication:  (pt up in chair)  Activity Tolerance: Patient tolerated treatment well Patient left: in chair;with call bell/phone within reach;with chair alarm set   Time: 5035-4656 OT Time Calculation (min): 22 min Charges:  OT General Charges $OT Visit: 1 Procedure OT Evaluation $OT Eval Moderate Complexity: 1 Procedure G-Codes:    Kila Godina 11/09/2015, 4:16 PM Lesle Chris, OTR/L [phone] 09-Nov-2015

## 2015-10-20 NOTE — Progress Notes (Signed)
Progress Note   Subjective  Chief Complaint: GI bleed  Per nursing staff, overnight patient had 2 bloody bowel movements, one was while he was on the toilet and he experienced a vagal response and briefly lost consciousness, his second bloody bowel movement was at 8:45 PM. He has not had one since that time. He did receive 2 more units of PRBCs. Patient tells me that currently he feels well, but is somewhat frustrated as the nursing staff have not responded quick enough when he has to go to the bathroom.    Objective   Vital signs in last 24 hours: Temp:  [97.9 F (36.6 C)-98.7 F (37.1 C)] 98.4 F (36.9 C) (09/21 0721) Pulse Rate:  [31-74] 69 (09/21 0700) Resp:  [9-28] 28 (09/21 0800) BP: (98-188)/(44-82) 188/79 (09/21 0800) SpO2:  [96 %-100 %] 100 % (09/21 0800) Last BM Date: 10/19/15 General:  African-American male in NAD Heart:  Regular rate and JQBHAL;9/3 systolic murmur Lungs: Respirations even and unlabored, lungs CTA bilaterally Abdomen:  Soft, nontender and nondistended. Normal bowel sounds. Extremities:  Without edema. Neurologic:  Alert and oriented,  grossly normal neurologically. Psych:  Cooperative. Normal mood and affect.  Intake/Output from previous day: 09/20 0701 - 09/21 0700 In: 1479 [P.O.:240; I.V.:575; Blood:664] Out: 1250 [Urine:650; Stool:600] Intake/Output this shift: Total I/O In: 75 [I.V.:75] Out: 250 [Urine:250]  Lab Results:  Recent Labs  10/19/15 1209 10/19/15 1858 10/20/15 0403  WBC 10.4 8.7 12.9*  HGB 9.1* 7.4* 9.8*  HCT 28.2* 23.1* 29.8*  PLT 117* PLATELET CLUMPS NOTED ON SMEAR, UNABLE TO ESTIMATE 85*   BMET  Recent Labs  10/18/15 1634 10/19/15 0609 10/20/15 0403  NA 141 142 141  K 4.4 3.9 4.5  CL 109 109 111  CO2 27 24 23   GLUCOSE 126* 107* 182*  BUN 38* 32* 26*  CREATININE 1.84* 1.69* 1.61*  CALCIUM 8.7* 8.3* 8.1*   LFT  Recent Labs  10/18/15 1634  PROT 6.2*  ALBUMIN 3.2*  AST 19  ALT 14*  ALKPHOS 69    BILITOT 0.4   PT/INR  Recent Labs  10/18/15 1634  LABPROT 13.8  INR 1.06    Studies/Results: [place] staff (in [place]): he had a colonoscopy in 2014 with Dr. Gustavo Lah; one small polyp and sigmoid diverticulosis.  He had EGD in 2014 with Dr. Gustavo Lah; gastritis.  These were done for GI bleeding, IDA.      Assessment / Plan:   Impression: 1. GI Bleed: painless rectal bleeding x3d, h/o diverticulosis and previous diverticular bleeds, last a year ago per pt, on ASA 81mg , h.o AVM, most recent EGD/Colo 10/10/12-bx showing minimal chronic gastritis and benign cecal and sigmoid polyp (no report from procedures, just path), hgb 12.1-->7.8 at time of admission, up to 8.6 after 1 unit prbcs; 2 more bloody bowel movements around 8 PM last night, hemoglobin dropped from 9.1-7.4, patient received 2 more units PRBCs and hemoglobin currently 9.8; continue to believe this represents a diverticular bleed 2. Elevated troponin: 0.03 x2, last EF 43% 10/12/15- Consider relation to recent stress test? Done Sept 13th 3. GERD: controlled on Protonix 4. H/o CVA: holding ASA 5. CKD-III 6. CHF: last EF 43% 10/12/15  Plan: 1. Continue monitoring hgb q6h with transfusion as necessary 2. Again, if signs of brisk active bleeding would recommend a bleeding scan ASAP 3. Patient may continue clear liquids 4. Continue supportive measures 5. Requested nursing staff to provide bedside commode 6. Will discuss above with Dr. Ardis Hughs,  please await any further recommendations  Thank you for your kind consultation, we will continue to follow.   LOS: 2 days   Levin Erp  10/20/2015, 9:38 AM  Pager # [phone]   ________________________________________________________________________  Velora Heckler GI MD note:  I personally examined the patient, reviewed the data and agree with the assessment and plan described above.  Seems to be stuttering lower GI bleed, presumably from known diverticulosis  Gustavo Lah colonsocopy 2014).  I'm going to prep him for colonoscopy tomorrow to attempt to find the site of bleeding and treat it.  Still a good chance that the bleeding will stop on it's own fortunately.   Owens Loffler, MD [facility] Pager [phone]

## 2015-10-20 NOTE — Progress Notes (Signed)
Patient ID: Ryan Mcclure, male   DOB: [birth year], 82 y.o.   MRN: [id number]    PROGRESS NOTE    Ryan Mcclure  MAU:[id number] DOB: [birth year] DOA: 10/18/2015  PCP: [place] Pink, MD   Brief Narrative:  82 y.o. male with medical history significant of GIB, AVM, diverticulosis, hypertension, hyperlipidemia, diabetes mellitus, CAD, s/p of CABG, s/p of sent, s/p of AVR with bioprosthetic valve, PVD, s/p of bilateral carotid artery endarterectomy, CKD-III, who presented with rectal bleeding and generalized weakness.  Assessment & Plan:   Acute blood loss anemia due to GIB (gastrointestinal bleeding), thrombocytopenia  - persistent blood stools, Hg repeat down from 9 --> 7.4 - transfused two U PRBC and Hg appropriately up post transfusion - GI team following - keep in SDU and monitor  - keep on clears in case of colonoscopy in AM  Essential HTN - continue home lisinopril and metoprolol - hold lasix  Elevated trop and arteriosclerosis of coronary artery - s/p of CBAG and stent. Trop 0.03. No CP or SOB.  - Likely due to demanding ischemia secondary to anemia.  - Patient had My[facility]iew on 10/12/15 at [facility] which showed hypokinesis of inferior wall with EF of 43% - keep on tele   DM-II with complications of nephropathy  - Last A1c 8.1 on 08/16/15, poorly controled. Patient is taking metformin, NovoLog and Lantus at home - continue Lantus 18 units daily  - continue SSI  GERD: - on PPI for now   Hx of Cerebrovascular accident (CVA) ([name]): - Hold ASA  - continue lipitor  CKD (chronic kidney disease), stage III - stable. Baseline creatinine 1.5-2.0, his creatinine is down from 1.8 --> 1.6 - BMP in AM  Obesity - Body mass index is 30.94 kg/m.  Chronic systolic CHF (congestive heart failure) ([name]) - 2-D echo on 10/16/15 showed EF 40%. Patient does not have leg edema or JVD. CHF is compensated  - monitor daily weights   DVT prophylaxis: SCD Code Status: Full  Family  Communication: Patient at bedside  Disposition Plan: Keep in SDU  Consultants:   GI  Procedures:   None  Antimicrobials:   None  Subjective: Still with blood bowel movements.   Objective: Vitals:   10/20/15 0700 10/20/15 0721 10/20/15 0800 10/20/15 1200  BP: (!) 130/50  (!) 188/79   Pulse: 69     Resp: 14  (!) 28   Temp:  98.4 F (36.9 C)  98 F (36.7 C)  TempSrc:  Oral  Oral  SpO2: 99%  100%   Weight:      Height:        Intake/Output Summary (Last 24 hours) at 10/20/15 1322 Last data filed at 10/20/15 1200  Gross per 24 hour  Intake             1554 ml  Output             1600 ml  Net              -46 ml   Filed Weights   10/18/15 2030 10/19/15 0500  Weight: 88.5 kg (195 lb 1.7 oz) 89.6 kg (197 lb 8.5 oz)    Examination:  General exam: Appears calm and comfortable  Respiratory system: Clear to auscultation. Respiratory effort normal. Cardiovascular system: S1 & S2 heard, RRR. No JVD, murmurs, rubs, gallops or clicks. No pedal edema. Gastrointestinal system: Abdomen is nondistended, soft and nontender. No organomegaly or masses felt.  Central nervous system: Alert and oriented.  No focal neurological deficits.  Data Reviewed: I have personally reviewed following labs and imaging studies  CBC:  Recent Labs Lab 10/19/15 0609 10/19/15 1209 10/19/15 1858 10/20/15 0403 10/20/15 1117  WBC 9.7 10.4 8.7 12.9* 10.0  HGB 8.6* 9.1* 7.4* 9.8* 8.8*  HCT 26.8* 28.2* 23.1* 29.8* 26.6*  MCV 84.5 84.9 84.9 87.6 84.4  PLT 91* 117* PLATELET CLUMPS NOTED ON SMEAR, UNABLE TO ESTIMATE 85* 84*   Basic Metabolic Panel:  Recent Labs Lab 10/18/15 1634 10/19/15 0609 10/20/15 0403  NA 141 142 141  K 4.4 3.9 4.5  CL 109 109 111  CO2 _0 GLUCOSE 126* 107* 182*  BUN 38* 32* 26*  CREATININE 1.84* 1.69* 1.61*  CALCIUM 8.7* 8.3* 8.1*   Liver Function Tests:  Recent Labs Lab 10/18/15 1634  AST 19  ALT 14*  ALKPHOS 69  BILITOT 0.4  PROT 6.2*  ALBUMIN  3.2*   Coagulation Profile:  Recent Labs Lab 10/18/15 1634  INR 1.06   Cardiac Enzymes:  Recent Labs Lab 10/18/15 1634 10/18/15 2254 10/19/15 0609 10/19/15 1209  TROPONINI 0.03* 0.03* 0.03* 0.03*   CBG:  Recent Labs Lab 10/19/15 1129 10/19/15 1638 10/19/15 2209 10/20/15 0808 10/20/15 1132  GLUCAP 111* 215* 219* 146* 216*   Lipid Profile:  Recent Labs  10/19/15 0609  CHOL 91  HDL 29*  LDLCALC 41  TRIG 105  CHOLHDL 3.1   Urine analysis:    Component Value Date/Time   COLORURINE Yellow 07/11/2012 2124   APPEARANCEUR Clear 07/11/2012 2124   LABSPEC 1.013 07/11/2012 2124   PHURINE 5.0 07/11/2012 2124   GLUCOSEU Negative 07/11/2012 2124   HGBUR Negative 07/11/2012 2124   BILIRUBINUR Negative 07/11/2012 2124   KETONESUR Negative 07/11/2012 2124   PROTEINUR Negative 07/11/2012 2124   NITRITE Negative 07/11/2012 2124   LEUKOCYTESUR Negative 07/11/2012 2124    Recent Results (from the past 240 hour(s))  MRSA PCR Screening     Status: None   Collection Time: 10/18/15  8:51 PM  Result Value Ref Range Status   MRSA by PCR NEGATIVE NEGATIVE Final    Comment:        The GeneXpert MRSA Assay (FDA approved for NASAL specimens only), is one component of a comprehensive MRSA colonization surveillance program. It is not intended to diagnose MRSA infection nor to guide or monitor treatment for MRSA infections.       Radiology Studies: No results found.    Scheduled Meds: . sodium chloride   Intravenous Once  . sodium chloride   Intravenous Once  . atorvastatin  40 mg Oral Daily  . ferrous sulfate  325 mg Oral Daily  . insulin aspart  0-9 Units Subcutaneous TID WC  . insulin glargine  18 Units Subcutaneous QHS  . lisinopril  5 mg Oral Daily  . metoprolol  50 mg Oral Daily  . peg 3350 powder  0.5 kit Oral Once per day on Thu Fri  . sodium chloride flush  3 mL Intravenous Q12H  . vitamin B-12  1,000 mcg Oral Daily   Continuous Infusions: .  pantoprozole (PROTONIX) infusion 8 mg/hr (10/20/15 0900)     LOS: 2 days    Time spent: 20 minutes    Faye Ramsay, MD [facility] Pager [phone]  If 7PM-7AM, please contact night-coverage [email or web address] Password [email or web address] 10/20/2015, 1:22 PM

## 2015-10-20 NOTE — Progress Notes (Signed)
2150- pt. Had been assisted to the bathroom with the aide of the nursing assistant to have a bowel movement. While on the toilet he experienced a vagal response and briefly lost consciousness. He was sitting on the toilet when the incident occurred with staff at his side to prevent injury.His HR blocked down to a rate of between 50 and 60 and his respiratory statis was not compromised. Pt. was assisted back to bed with assist by 4 four staff members. BP was transiently low but normalized in less than 10 minutes as did HR. This was his second bloody BM since 2045 and he was in the process of getting blood.  After getting settled back into bed the pt denied any pain or discomfort. Call placed to [facility] to update them on this incident- no orders received and they did not wish to contact GI at this time unless the pt had worsening vital signs or symptoms. 10/20/15 0040- Completed second unit of blood with no more bloody stools - will continue to monitor.

## 2015-10-21 ENCOUNTER — Encounter (HOSPITAL_COMMUNITY)
Admission: AD | Disposition: A | Discharge status: Home Health | Payer: Self-pay | Source: Other Acute Inpatient Hospital | Attending: Internal Medicine

## 2015-10-21 ENCOUNTER — Encounter (HOSPITAL_COMMUNITY): Payer: Self-pay

## 2015-10-21 DIAGNOSIS — K921 Melena: Secondary | ICD-10-CM

## 2015-10-21 DIAGNOSIS — K649 Unspecified hemorrhoids: Secondary | ICD-10-CM

## 2015-10-21 DIAGNOSIS — K5730 Diverticulosis of large intestine without perforation or abscess without bleeding: Secondary | ICD-10-CM

## 2015-10-21 HISTORY — PX: COLONOSCOPY: SHX5424

## 2015-10-21 LAB — BASIC METABOLIC PANEL
Anion gap: 10 (ref 5–15)
BUN: 18 mg/dL (ref 6–20)
CO2: 23 mmol/L (ref 22–32)
CREATININE: 1.61 mg/dL — AB (ref 0.61–1.24)
Calcium: 8.8 mg/dL — ABNORMAL LOW (ref 8.9–10.3)
Chloride: 113 mmol/L — ABNORMAL HIGH (ref 101–111)
GFR calc Af Amer: 45 mL/min — ABNORMAL LOW (ref >60)
GFR, EST NON AFRICAN AMERICAN: 38 mL/min — AB (ref >60)
GLUCOSE: 159 mg/dL — AB (ref 65–99)
POTASSIUM: 4.1 mmol/L (ref 3.5–5.1)
SODIUM: 146 mmol/L — AB (ref 135–145)

## 2015-10-21 LAB — GLUCOSE, CAPILLARY
GLUCOSE-CAPILLARY: 136 mg/dL — AB (ref 65–99)
GLUCOSE-CAPILLARY: 245 mg/dL — AB (ref 65–99)
GLUCOSE-CAPILLARY: 249 mg/dL — AB (ref 65–99)
Glucose-Capillary: 219 mg/dL — ABNORMAL HIGH (ref 65–99)

## 2015-10-21 LAB — CBC
HCT: 29.7 % — ABNORMAL LOW (ref 39.0–52.0)
Hemoglobin: 9.9 g/dL — ABNORMAL LOW (ref 13.0–17.0)
MCH: 28.4 pg (ref 26.0–34.0)
MCHC: 33.3 g/dL (ref 30.0–36.0)
MCV: 85.1 fL (ref 78.0–100.0)
PLATELETS: 60 10*3/uL — AB (ref 150–400)
RBC: 3.49 MIL/uL — AB (ref 4.22–5.81)
RDW: 14.8 % (ref 11.5–15.5)
WBC: 10.8 10*3/uL — ABNORMAL HIGH (ref 4.0–10.5)

## 2015-10-21 SURGERY — COLONOSCOPY
Anesthesia: Monitor Anesthesia Care

## 2015-10-21 SURGERY — COLONOSCOPY
Anesthesia: Moderate Sedation

## 2015-10-21 MED ORDER — FENTANYL CITRATE (PF) 100 MCG/2ML IJ SOLN
INTRAMUSCULAR | Status: AC
  Filled 2015-10-21: qty 2

## 2015-10-21 MED ORDER — DIPHENHYDRAMINE HCL 50 MG/ML IJ SOLN
INTRAMUSCULAR | Status: AC
  Filled 2015-10-21: qty 1

## 2015-10-21 MED ORDER — PANTOPRAZOLE SODIUM 40 MG PO TBEC
40.0000 mg | DELAYED_RELEASE_TABLET | Freq: Every day | ORAL | Status: DC
  Administered 2015-10-21 – 2015-10-23 (×3): 40 mg via ORAL
  Filled 2015-10-21 (×3): qty 1

## 2015-10-21 MED ORDER — PANTOPRAZOLE SODIUM 40 MG IV SOLR: 40.0000 mg | Freq: Two times a day (BID) | INTRAVENOUS | Status: DC

## 2015-10-21 MED ORDER — MIDAZOLAM HCL 5 MG/ML IJ SOLN
INTRAMUSCULAR | Status: AC
  Filled 2015-10-21: qty 2

## 2015-10-21 MED ORDER — MIDAZOLAM HCL 5 MG/5ML IJ SOLN
INTRAMUSCULAR | Status: DC | PRN
  Administered 2015-10-21 (×2): 2 mg via INTRAVENOUS

## 2015-10-21 MED ORDER — MIDAZOLAM HCL 5 MG/ML IJ SOLN
INTRAMUSCULAR | Status: AC
  Filled 2015-10-21: qty 1

## 2015-10-21 MED ORDER — FENTANYL CITRATE (PF) 100 MCG/2ML IJ SOLN
INTRAMUSCULAR | Status: DC | PRN
  Administered 2015-10-21 (×2): 12.5 ug via INTRAVENOUS
  Administered 2015-10-21: 25 ug via INTRAVENOUS

## 2015-10-21 MED ORDER — INSULIN ASPART 100 UNIT/ML ~~LOC~~ SOLN
0.0000 [IU] | Freq: Three times a day (TID) | SUBCUTANEOUS | Status: DC
  Administered 2015-10-21 – 2015-10-22 (×2): 5 [IU] via SUBCUTANEOUS
  Administered 2015-10-22: 2 [IU] via SUBCUTANEOUS
  Administered 2015-10-22: 5 [IU] via SUBCUTANEOUS
  Administered 2015-10-23: 3 [IU] via SUBCUTANEOUS

## 2015-10-21 MED ORDER — FUROSEMIDE 20 MG PO TABS
20.0000 mg | ORAL_TABLET | Freq: Two times a day (BID) | ORAL | Status: DC
  Administered 2015-10-21 – 2015-10-23 (×4): 20 mg via ORAL
  Filled 2015-10-21 (×4): qty 1

## 2015-10-21 MED ORDER — HYDROCODONE-ACETAMINOPHEN 5-325 MG PO TABS: 1.0000 | ORAL_TABLET | Freq: Four times a day (QID) | ORAL | Status: DC | PRN

## 2015-10-21 NOTE — Progress Notes (Signed)
Patient ID: Ryan Mcclure, male   DOB: [birth year], 82 y.o.   MRN: [id number]    PROGRESS NOTE    Ryan Mcclure  WIO:[id number] DOB: [birth year] DOA: 10/18/2015  PCP: [place] Pink, MD   Brief Narrative:  82 y.o. male with medical history significant of GIB, AVM, diverticulosis, hypertension, hyperlipidemia, diabetes mellitus, CAD, s/p of CABG, s/p of sent, s/p of AVR with bioprosthetic valve, PVD, s/p of bilateral carotid artery endarterectomy, CKD-III, who presented with rectal bleeding and generalized weakness.  Assessment & Plan:   Acute blood loss anemia due to GIB (gastrointestinal bleeding), thrombocytopenia  - persistent blood stools, Hg down from 9/20 9 --> 7.4 - transfused two U PRBC 9/20 and Hg appropriately up post transfusion and remains stable for the past 48 hours with no further bleeding episodes  - GI team following, pt underwent colonoscopy --> diverticulosis, otherwise clear  - diet advanced and if pt does well overnight, can go home in AM - stop protonix infusion   Essential HTN - continue home lisinopril and metoprolol - resume lasix   Elevated trop and arteriosclerosis of coronary artery - s/p of CBAG and stent. Trop 0.03. No CP or SOB.  - Likely due to demanding ischemia secondary to anemia.  - Patient had Myoview on 10/12/15 at [facility] which showed hypokinesis of inferior wall with EF of 43% - no chest pain, discontinue tele   DM-II with complications of nephropathy  - Last A1c 8.1 on 08/16/15, poorly controled. Patient is taking metformin, NovoLog and Lantus at home - continue Lantus 18 units daily  - continue SSI, change to moderate coverage   GERD: - on PPI but infusion can be stopped and pt placed on oral PPI   Hx of Cerebrovascular accident (CVA) ([place]): - Hold ASA  - continue lipitor  CKD (chronic kidney disease), stage III - stable. Baseline creatinine 1.5-2.0, his creatinine is down from 1.8 --> 1.6, overall stable - BMP in AM  Obesity -  Body mass index is 30.94 kg/m.  Chronic systolic CHF (congestive heart failure) ([place]) - 2-D echo on 10/16/15 showed EF 40%. Patient does not have leg edema or JVD. CHF is compensated  - monitor daily weights  DVT prophylaxis: SCD Code Status: Full  Family Communication: Patient and wife at bedside  Disposition Plan: Home in AM  Consultants:   GI  Procedures:   Colonoscopy 9/22 --> diverticulosis   Antimicrobials:   None  Subjective: Feels much better this AM.   Objective: Vitals:   10/21/15 1000 10/21/15 1024 10/21/15 1100 10/21/15 1200  BP: 132/67 132/67    Pulse: 98 (!) 110 90   Resp: 14  (!) 24   Temp:    98.4 F (36.9 C)  TempSrc:    Oral  SpO2: 98%  100%   Weight:      Height:        Intake/Output Summary (Last 24 hours) at 10/21/15 1258 Last data filed at 10/21/15 0900  Gross per 24 hour  Intake             1220 ml  Output              151 ml  Net             1069 ml   Filed Weights   10/18/15 2030 10/19/15 0500  Weight: 88.5 kg (195 lb 1.7 oz) 89.6 kg (197 lb 8.5 oz)    Examination:  General exam: Appears calm and comfortable  Respiratory system: Clear to auscultation. Respiratory effort normal. Cardiovascular system: S1 & S2 heard, RRR. No JVD, murmurs, rubs, gallops or clicks. No pedal edema. Gastrointestinal system: Abdomen is nondistended, soft and nontender. No organomegaly or masses felt.  Central nervous system: Alert and oriented. No focal neurological deficits.  Data Reviewed: I have personally reviewed following labs and imaging studies  CBC:  Recent Labs Lab 10/19/15 1209 10/19/15 1858 10/20/15 0403 10/20/15 1117 10/21/15 0316  WBC 10.4 8.7 12.9* 10.0 10.8*  HGB 9.1* 7.4* 9.8* 8.8* 9.9*  HCT 28.2* 23.1* 29.8* 26.6* 29.7*  MCV 84.9 84.9 87.6 84.4 85.1  PLT 117* PLATELET CLUMPS NOTED ON SMEAR, UNABLE TO ESTIMATE 85* 84* 60*   Basic Metabolic Panel:  Recent Labs Lab 10/18/15 1634 10/19/15 0609 10/20/15 0403  10/21/15 0538  NA 141 142 141 146*  K 4.4 3.9 4.5 4.1  CL 109 109 111 113*  CO2 27 24 23 23   GLUCOSE 126* 107* 182* 159*  BUN 38* 32* 26* 18  CREATININE 1.84* 1.69* 1.61* 1.61*  CALCIUM 8.7* 8.3* 8.1* 8.8*   Liver Function Tests:  Recent Labs Lab 10/18/15 1634  AST 19  ALT 14*  ALKPHOS 69  BILITOT 0.4  PROT 6.2*  ALBUMIN 3.2*   Coagulation Profile:  Recent Labs Lab 10/18/15 1634  INR 1.06   Cardiac Enzymes:  Recent Labs Lab 10/18/15 1634 10/18/15 2254 10/19/15 0609 10/19/15 1209  TROPONINI 0.03* 0.03* 0.03* 0.03*   CBG:  Recent Labs Lab 10/20/15 1132 10/20/15 1611 10/20/15 2133 10/21/15 0910 10/21/15 1144  GLUCAP 216* 239* 167* 136* 245*   Lipid Profile:  Recent Labs  10/19/15 0609  CHOL 91  HDL 29*  LDLCALC 41  TRIG 105  CHOLHDL 3.1   Urine analysis:    Component Value Date/Time   COLORURINE Yellow 07/11/2012 2124   APPEARANCEUR Clear 07/11/2012 2124   LABSPEC 1.013 07/11/2012 2124   PHURINE 5.0 07/11/2012 2124   GLUCOSEU Negative 07/11/2012 2124   HGBUR Negative 07/11/2012 2124   BILIRUBINUR Negative 07/11/2012 2124   KETONESUR Negative 07/11/2012 2124   PROTEINUR Negative 07/11/2012 2124   NITRITE Negative 07/11/2012 2124   LEUKOCYTESUR Negative 07/11/2012 2124    Recent Results (from the past 240 hour(s))  MRSA PCR Screening     Status: None   Collection Time: 10/18/15  8:51 PM  Result Value Ref Range Status   MRSA by PCR NEGATIVE NEGATIVE Final    Comment:        The GeneXpert MRSA Assay (FDA approved for NASAL specimens only), is one component of a comprehensive MRSA colonization surveillance program. It is not intended to diagnose MRSA infection nor to guide or monitor treatment for MRSA infections.       Radiology Studies: No results found.    Scheduled Meds: . sodium chloride   Intravenous Once  . atorvastatin  40 mg Oral Daily  . ferrous sulfate  325 mg Oral Daily  . insulin aspart  0-9 Units  Subcutaneous TID WC  . insulin glargine  18 Units Subcutaneous QHS  . lisinopril  5 mg Oral Daily  . metoprolol  50 mg Oral Daily  . pantoprazole  40 mg Intravenous Q12H  . sodium chloride flush  3 mL Intravenous Q12H  . vitamin B-12  1,000 mcg Oral Daily   Continuous Infusions: . pantoprozole (PROTONIX) infusion 8 mg/hr (10/21/15 0800)     LOS: 3 days    Time spent: 20 minutes    MAGICK-Valree Feild, Rebecca Eaton, MD [facility] Pager [phone]  If 7PM-7AM, please contact night-coverage [email or web address] Password [facility] 10/21/2015, 12:58 PM

## 2015-10-21 NOTE — H&P (View-Only) (Signed)
Progress Note   Subjective  Chief Complaint: GI bleed  Per nursing staff, overnight patient had 2 bloody bowel movements, one was while he was on the toilet and he experienced a vagal response and briefly lost consciousness, his second bloody bowel movement was at 8:45 PM. He has not had one since that time. He did receive 2 more units of PRBCs. Patient tells me that currently he feels well, but is somewhat frustrated as the nursing staff have not responded quick enough when he has to go to the bathroom.    Objective   Vital signs in last 24 hours: Temp:  [97.9 F (36.6 C)-98.7 F (37.1 C)] 98.4 F (36.9 C) (09/21 0721) Pulse Rate:  [31-74] 69 (09/21 0700) Resp:  [9-28] 28 (09/21 0800) BP: (98-188)/(44-82) 188/79 (09/21 0800) SpO2:  [96 %-100 %] 100 % (09/21 0800) Last BM Date: 10/19/15 General:  African-American male in NAD Heart:  Regular rate and FAOZHY;8/6 systolic murmur Lungs: Respirations even and unlabored, lungs CTA bilaterally Abdomen:  Soft, nontender and nondistended. Normal bowel sounds. Extremities:  Without edema. Neurologic:  Alert and oriented,  grossly normal neurologically. Psych:  Cooperative. Normal mood and affect.  Intake/Output from previous day: 09/20 0701 - 09/21 0700 In: 1479 [P.O.:240; I.V.:575; Blood:664] Out: 1250 [Urine:650; Stool:600] Intake/Output this shift: Total I/O In: 75 [I.V.:75] Out: 250 [Urine:250]  Lab Results:  Recent Labs  10/19/15 1209 10/19/15 1858 10/20/15 0403  WBC 10.4 8.7 12.9*  HGB 9.1* 7.4* 9.8*  HCT 28.2* 23.1* 29.8*  PLT 117* PLATELET CLUMPS NOTED ON SMEAR, UNABLE TO ESTIMATE 85*   BMET  Recent Labs  10/18/15 1634 10/19/15 0609 10/20/15 0403  NA 141 142 141  K 4.4 3.9 4.5  CL 109 109 111  CO2 27 24 23   GLUCOSE 126* 107* 182*  BUN 38* 32* 26*  CREATININE 1.84* 1.69* 1.61*  CALCIUM 8.7* 8.3* 8.1*   LFT  Recent Labs  10/18/15 1634  PROT 6.2*  ALBUMIN 3.2*  AST 19  ALT 14*  ALKPHOS 69    BILITOT 0.4   PT/INR  Recent Labs  10/18/15 1634  LABPROT 13.8  INR 1.06    Studies/Results: [place] staff (in [place]): he had a colonoscopy in 2014 with Dr. Gustavo Lah; one small polyp and sigmoid diverticulosis.  He had EGD in 2014 with Dr. Gustavo Lah; gastritis.  These were done for GI bleeding, IDA.      Assessment / Plan:   Impression: 1. GI Bleed: painless rectal bleeding x3d, h/o diverticulosis and previous diverticular bleeds, last a year ago per pt, on ASA 81mg , h.o AVM, most recent EGD/Colo 10/10/12-bx showing minimal chronic gastritis and benign cecal and sigmoid polyp (no report from procedures, just path), hgb 12.1-->7.8 at time of admission, up to 8.6 after 1 unit prbcs; 2 more bloody bowel movements around 8 PM last night, hemoglobin dropped from 9.1-7.4, patient received 2 more units PRBCs and hemoglobin currently 9.8; continue to believe this represents a diverticular bleed 2. Elevated troponin: 0.03 x2, last EF 43% 10/12/15- Consider relation to recent stress test? Done Sept 13th 3. GERD: controlled on Protonix 4. H/o CVA: holding ASA 5. CKD-III 6. CHF: last EF 43% 10/12/15  Plan: 1. Continue monitoring hgb q6h with transfusion as necessary 2. Again, if signs of brisk active bleeding would recommend a bleeding scan ASAP 3. Patient may continue clear liquids 4. Continue supportive measures 5. Requested nursing staff to provide bedside commode 6. Will discuss above with Dr. Ardis Hughs,  please await any further recommendations  Thank you for your kind consultation, we will continue to follow.   LOS: 2 days   Levin Erp  10/20/2015, 9:38 AM  Pager # [phone]   ________________________________________________________________________  Velora Heckler GI MD note:  I personally examined the patient, reviewed the data and agree with the assessment and plan described above.  Seems to be stuttering lower GI bleed, presumably from known diverticulosis  Gustavo Lah colonsocopy 2014).  I'm going to prep him for colonoscopy tomorrow to attempt to find the site of bleeding and treat it.  Still a good chance that the bleeding will stop on it's own fortunately.   Owens Loffler, MD [facility] Pager [phone]

## 2015-10-21 NOTE — Progress Notes (Signed)
Occupational Therapy Treatment Patient Details Name: Ryan Mcclure MRN: [id number] DOB: [birth year] Today's Date: 10/21/2015    History of present illness Ryan Mcclure is a 82 y.o. male with medical history significant of GIB, AVM, diverticulosis, hypertension, hyperlipidemia, diabetes mellitus, CAD, s/p of CABG, s/p of sent, s/p of AVR with bioprosthetic valve, PVD, s/p of bilateral carotid artery endarterectomy, CKD-III, who presents with rectal bleeding and generalized weakness   OT comments  Pt able to get up with less assistance today.  If he returns to ALF, he does not have assistance for adls. Recommend [place] aide as well as HHOT to reach his previous independent level  Follow Up Recommendations  Home health OT (and HH Aide vs SNF)    Equipment Recommendations  3 in 1 bedside comode, if his commode isn't high   Recommendations for Other Services      Precautions / Restrictions Precautions Precautions: Fall Precaution Comments: high BP Restrictions Weight Bearing Restrictions: No       Mobility Bed Mobility         Supine to sit: Min guard     General bed mobility comments: extra time  Transfers   Equipment used: Rolling walker (2 wheeled)   Sit to Stand: Min guard         General transfer comment: cues for UE placement    Balance                                   ADL                           Toilet Transfer: Min guard;Ambulation;RW (around bed to chair)             General ADL Comments: Pt stood to use urinal and missed urinal initially.  Pt cleaned himself (peri area) and I assisted with socks and gown.        Vision                     Perception     Praxis      Cognition   Behavior During Therapy: WFL for tasks assessed/performed Overall Cognitive Status: Within Functional Limits for tasks assessed                       Extremity/Trunk Assessment               Exercises      Shoulder Instructions       General Comments      Pertinent Vitals/ Pain       Pain Assessment: No/denies pain  Home Living                                          Prior Functioning/Environment              Frequency           Progress Toward Goals  OT Goals(current goals can now be found in the care plan section)  Progress towards OT goals     Plan      Co-evaluation                 End of Session     Activity Tolerance Patient tolerated  treatment well   Patient Left in chair;with call bell/phone within reach;with chair alarm set   Nurse Communication          Time: [phone] OT Time Calculation (min): 27 min  Charges: OT General Charges $OT Visit: 1 Procedure OT Treatments $Self Care/Home Management : 8-22 mins $Therapeutic Activity: 8-22 mins  [name] 10/21/2015, 3:37 PM   Lesle Chris, OTR/L [phone] 10/21/2015

## 2015-10-21 NOTE — Progress Notes (Signed)
Inpatient Diabetes Program Recommendations  AACE/ADA: New Consensus Statement on Inpatient Glycemic Control (2015)  Target Ranges:  Prepandial:   less than 140 mg/dL      Peak postprandial:   less than 180 mg/dL (1-2 hours)      Critically ill patients:  140 - 180 mg/dL   Results for ROMAR, WOODRICK (MRN [id number]) as of 10/21/2015 11:30  Ref. Range 10/20/2015 08:08 10/20/2015 11:32 10/20/2015 16:11 10/20/2015 21:33  Glucose-Capillary Latest Ref Range: 65 - 99 mg/dL 146 (H) 216 (H) 239 (H) 167 (H)    Home DM Meds: Lantus 26 units QHS       Novolog 6 units Breakfast/ 8 units Lunch/ 10 units Dinner       Metformin 500 mg bid  Current Insulin Orders: Lantus 18 units QHS      Novolog Sensitive Correction Scale/ SSI (0-9 units) TID AC      MD- Please consider increasing Novolog Moderate Correction Scale/ SSI to Moderate scale (0-15 units) TID AC + HS (currently ordered as Sensitive scale 0-9 units)      --Will follow patient during hospitalization--  Wyn Quaker RN, MSN, CDE Diabetes Coordinator Inpatient Glycemic Control Team Team Pager: [phone] (8a-5p)

## 2015-10-21 NOTE — Progress Notes (Signed)
PT Cancellation Note  Patient Details Name: Ryan Mcclure MRN: [id number] DOB: 07/15/1931   Cancelled Treatment:     Colonoscopy this am.  Will check back another day as schedule permits.    [name] 10/21/2015, 9:18 AM

## 2015-10-21 NOTE — Op Note (Addendum)
[facility] Patient Name: Ryan Mcclure Procedure Date: 10/21/2015 MRN: [id number] Attending MD: Milus Banister , MD Date of Birth: 1931-05-04 CSN: [id number] Age: 82 Admit Type: Inpatient Procedure:                Colonoscopy Indications:              Hematochezia Providers:                Milus Banister, MD, Sarah Monday RN, RN, Elspeth Cho Tech., Technician Referring MD:              Medicines:                Fentanyl 50 micrograms IV, Midazolam 4 mg IV Complications:            No immediate complications. Estimated blood loss:                            None. Estimated Blood Loss:     Estimated blood loss: none. Procedure:                Pre-Anesthesia Assessment:                           - Prior to the procedure, a History and Physical                            was performed, and patient medications and                            allergies were reviewed. The patient's tolerance of                            previous anesthesia was also reviewed. The risks                            and benefits of the procedure and the sedation                            options and risks were discussed with the patient.                            All questions were answered, and informed consent                            was obtained. Prior Anticoagulants: The patient has                            taken no previous anticoagulant or antiplatelet                            agents. ASA Grade Assessment: III - A patient with  severe systemic disease. After reviewing the risks                            and benefits, the patient was deemed in                            satisfactory condition to undergo the procedure.                           After obtaining informed consent, the colonoscope                            was passed under direct vision. Throughout the                            procedure, the patient's blood  pressure, pulse, and                            oxygen saturations were monitored continuously. The                            EC-[id number] ([id number]) scope was introduced through                            the anus and advanced to the the cecum, identified                            by appendiceal orifice and ileocecal valve. The                            colonoscopy was performed without difficulty. The                            patient tolerated the procedure well. The quality                            of the bowel preparation was adequate. The                            ileocecal valve, appendiceal orifice, and rectum                            were photographed. Scope In: 8:41:32 AM Scope Out: 7:57:14 AM Scope Withdrawal Time: 0 hours 6 minutes 21 seconds  Total Procedure Duration: 0 hours 10 minutes 56 seconds  Findings:      There was no new or old blood in the colon.      Multiple small and large-mouthed diverticula were found in the entire       colon.      External and internal hemorrhoids were found. The hemorrhoids were small.      The exam was otherwise without abnormality on direct and retroflexion       views. Impression:               - Diverticulosis throughout the colon, this is  almost certainly the cause of his recent GI                            bleeding.                           - No new or old blood in colon currently.                           - Small internal and external hemorrhoids. Moderate Sedation:      Moderate (conscious) sedation was administered by the endoscopy nurse       and supervised by the endoscopist. The following parameters were       monitored: oxygen saturation, heart rate, blood pressure, and response       to care. Total physician intraservice time was 15 minutes. Recommendation:           - Return patient to hospital ward for observation.                            If no recurrent overt bleeding, he is OK to                             discharge home later today or tomorrow morning. His                            platelets have dropped to 60K since admission,                            possibly consumptive due to his acute bleeding. I                            will leave it to primary service to evaluate                            further if they feel it is necessary.                           - Advance diet as tolerated.                           - Continue present medications. Procedure Code(s):        --- Professional ---                           [id number], Colonoscopy, flexible; diagnostic, including                            collection of specimen(s) by brushing or washing,                            when performed (separate procedure)                           99152, Moderate sedation services provided by the  same physician or other qualified health care                            professional performing the diagnostic or                            therapeutic service that the sedation supports,                            requiring the presence of an independent trained                            observer to assist in the monitoring of the                            patient's level of consciousness and physiological                            status; initial 15 minutes of intraservice time,                            patient age 80 years or older Diagnosis Code(s):        --- Professional ---                           K92.1, Melena (includes Hematochezia) CPT copyright 2016 American Medical Association. All rights reserved. The codes documented in this report are preliminary and upon coder review may  be revised to meet current compliance requirements. Milus Banister, MD 10/21/2015 8:03:12 AM This report has been signed electronically. Number of Addenda: 0

## 2015-10-21 NOTE — Interval H&P Note (Signed)
History and Physical Interval Note:  10/21/2015 7:18 AM  Ryan Mcclure  has presented today for surgery, with the diagnosis of .gib  The various methods of treatment have been discussed with the patient and family. After consideration of risks, benefits and other options for treatment, the patient has consented to  Procedure(s): COLONOSCOPY (N/A) as a surgical intervention .  The patient's history has been reviewed, patient examined, no change in status, stable for surgery.  I have reviewed the patient's chart and labs.  Questions were answered to the patient's satisfaction.     Milus Banister

## 2015-10-22 LAB — BASIC METABOLIC PANEL
Anion gap: 6 (ref 5–15)
BUN: 16 mg/dL (ref 6–20)
CALCIUM: 8.6 mg/dL — AB (ref 8.9–10.3)
CHLORIDE: 109 mmol/L (ref 101–111)
CO2: 25 mmol/L (ref 22–32)
CREATININE: 2.01 mg/dL — AB (ref 0.61–1.24)
GFR calc non Af Amer: 29 mL/min — ABNORMAL LOW (ref >60)
GFR, EST AFRICAN AMERICAN: 34 mL/min — AB (ref >60)
Glucose, Bld: 246 mg/dL — ABNORMAL HIGH (ref 65–99)
Potassium: 3.9 mmol/L (ref 3.5–5.1)
SODIUM: 140 mmol/L (ref 135–145)

## 2015-10-22 LAB — GLUCOSE, CAPILLARY
GLUCOSE-CAPILLARY: 191 mg/dL — AB (ref 65–99)
Glucose-Capillary: 204 mg/dL — ABNORMAL HIGH (ref 65–99)
Glucose-Capillary: 241 mg/dL — ABNORMAL HIGH (ref 65–99)
Glucose-Capillary: 270 mg/dL — ABNORMAL HIGH (ref 65–99)

## 2015-10-22 LAB — CBC
HCT: 26.4 % — ABNORMAL LOW (ref 39.0–52.0)
HEMOGLOBIN: 8.6 g/dL — AB (ref 13.0–17.0)
MCH: 28.2 pg (ref 26.0–34.0)
MCHC: 32.6 g/dL (ref 30.0–36.0)
MCV: 86.6 fL (ref 78.0–100.0)
Platelets: 96 10*3/uL — ABNORMAL LOW (ref 150–400)
RBC: 3.05 MIL/uL — AB (ref 4.22–5.81)
RDW: 14.9 % (ref 11.5–15.5)
WBC: 11.9 10*3/uL — ABNORMAL HIGH (ref 4.0–10.5)

## 2015-10-22 LAB — HEMOGLOBIN AND HEMATOCRIT, BLOOD
HCT: 20.9 % — ABNORMAL LOW (ref 39.0–52.0)
Hemoglobin: 6.7 g/dL — CL (ref 13.0–17.0)

## 2015-10-22 LAB — PREPARE RBC (CROSSMATCH)

## 2015-10-22 MED ORDER — SODIUM CHLORIDE 0.9 % IV SOLN
Freq: Once | INTRAVENOUS | Status: AC
  Administered 2015-10-22: 19:00:00 via INTRAVENOUS

## 2015-10-22 MED ORDER — FUROSEMIDE 10 MG/ML IJ SOLN
10.0000 mg | Freq: Once | INTRAMUSCULAR | Status: DC
Start: 2015-10-22 — End: 2015-10-23

## 2015-10-22 NOTE — Progress Notes (Signed)
[facility] PROGRESS NOTE  BLAIDEN WERTH VWU:[id number] DOB: [birth year] DOA: 10/18/2015 PCP: [place] Pink, MD    Principal Problem:   GIB (gastrointestinal bleeding) Active Problems:   Benign essential HTN   Arteriosclerosis of coronary artery   Controlled type 2 diabetes mellitus without complication (HCC)   Acid reflux   Cerebrovascular accident (CVA) ([place])   CKD (chronic kidney disease), stage III   Chronic systolic CHF (congestive heart failure) (HCC)   Stroke (cerebrum) (HCC)   Blood loss anemia   Hematochezia   Diverticulosis of large intestine without hemorrhage   Hemorrhoid   Brief Narrative:  82 y.o.malewith medical history significant of GIB, AVM, diverticulosis, hypertension, hyperlipidemia, diabetes mellitus, CAD, s/p of CABG, s/p of sent, s/p of AVR with bioprosthetic valve, PVD, s/p of bilateral carotid artery endarterectomy, CKD-III, who presented with rectal bleeding and generalized weakness.  Assessment & Plan:   Acute blood loss anemia due to GIB (gastrointestinal bleeding), thrombocytopenia  - persistent blood stools, and drop in hemoglobin on admission. transfused two U PRBC 9/20 and Hg appropriately up post transfusion. pt underwent colonoscopy --> diverticulosis, otherwise clear  -today repeat Hg 6.7. no BM yet, no s/s of acute bleeding. D/w patient will TF 2 units with symptomatic anemia. Repeat CBC AM, monitor for bleeding   Essential HTN continue home lisinopriland metoprolol.  resume lasix   Elevated trop and arteriosclerosis of coronary artery. s/p of CBAG and stent. Trop 0.03. No CP or SOB. Likely due to demandingischemia secondary to anemia. Patient had Myoview on 9/13/17at [facility] which showed hypokinesis of inferior wall with EF of 43%.  - no chest pain, discontinue tele   DM-II with complications of nephropathy.  Last A1c 8.1 on 08/16/15, poorly controled. Patient is taking metformin, NovoLog and Lantus at home - continue Lantus 18  units daily.  continue SSI, change to moderate coverage. D/c metformin due to renal issues   GERD: on PPI   Hx of Cerebrovascular accident (CVA) [facility]): Hold ASA with acute blood loss. continue lipitor  CKD (chronic kidney disease), stage III.  stable. Baseline creatinine 1.5-2.0, his creatinine is down from 1.8 --> 1.6, overall stable  Obesity Body mass index is 30.94 kg/m.  Chronic systolic CHF (congestive heart failure) ([place]) 2-D echo on 10/16/15 showed EF 40%. Patient does not have leg edema or JVD. CHF is compensated  - monitor daily weights   Code Status: full Family Communication: d/w patient, RN (indicate person spoken with, relationship, and if by phone, the number) Disposition Plan: home pend improvement   Consultants:   GI  Procedures:   Colonoscopy 9/22 --> diverticulosis   Antimicrobials:   None   HPI/Subjective: Alert, not in distress. Denies blood in stool. But Hg is 6.6. D/w patient, will give 2 U PRBC  Objective: Vitals:   10/21/15 2130 10/22/15 0600  BP: (!) 154/65 (!) 155/79  Pulse: 71 76  Resp: 19 20  Temp: 99.4 F (37.4 C) 98.2 F (36.8 C)    Intake/Output Summary (Last 24 hours) at 10/22/15 1320 Last data filed at 10/22/15 1135  Gross per 24 hour  Intake              830 ml  Output              725 ml  Net              105 ml   Filed Weights   10/19/15 0500 10/21/15 1654 10/22/15 0600  Weight: 89.6 kg (197 lb  8.5 oz) 90.8 kg (200 lb 1.6 oz) 88.5 kg (195 lb 1.6 oz)    Exam:   General:  Comfortable   Cardiovascular: s1,s2 rrr  Respiratory: CTA BL   Abdomen: soft, nt,nd   Musculoskeletal: no leg edema    Data Reviewed: Basic Metabolic Panel:  Recent Labs Lab 10/18/15 1634 10/19/15 0609 10/20/15 0403 10/21/15 0538 10/22/15 0407  NA 141 142 141 146* 140  K 4.4 3.9 4.5 4.1 3.9  CL 109 109 111 113* 109  CO2 27 24 23 23 25   GLUCOSE 126* 107* 182* 159* 246*  BUN 38* 32* 26* 18 16  CREATININE 1.84* 1.69* 1.61*  1.61* 2.01*  CALCIUM 8.7* 8.3* 8.1* 8.8* 8.6*   Liver Function Tests:  Recent Labs Lab 10/18/15 1634  AST 19  ALT 14*  ALKPHOS 69  BILITOT 0.4  PROT 6.2*  ALBUMIN 3.2*   No results for input(s): LIPASE, AMYLASE in the last 168 hours. No results for input(s): AMMONIA in the last 168 hours. CBC:  Recent Labs Lab 10/19/15 1858 10/20/15 0403 10/20/15 1117 10/21/15 0316 10/22/15 0407 10/22/15 1150  WBC 8.7 12.9* 10.0 10.8* 11.9*  --   HGB 7.4* 9.8* 8.8* 9.9* 8.6* 6.7*  HCT 23.1* 29.8* 26.6* 29.7* 26.4* 20.9*  MCV 84.9 87.6 84.4 85.1 86.6  --   PLT PLATELET CLUMPS NOTED ON SMEAR, UNABLE TO ESTIMATE 85* 84* 60* 96*  --    Cardiac Enzymes:  Recent Labs Lab 10/18/15 1634 10/18/15 2254 10/19/15 0609 10/19/15 1209  TROPONINI 0.03* 0.03* 0.03* 0.03*   BNP (last 3 results)  Recent Labs  10/18/15 2256  BNP 91.9    ProBNP (last 3 results) No results for input(s): PROBNP in the last 8760 hours.  CBG:  Recent Labs Lab 10/21/15 1144 10/21/15 1716 10/21/15 2126 10/22/15 0804 10/22/15 1132  GLUCAP 245* 249* 219* 204* 191*    Recent Results (from the past 240 hour(s))  MRSA PCR Screening     Status: None   Collection Time: 10/18/15  8:51 PM  Result Value Ref Range Status   MRSA by PCR NEGATIVE NEGATIVE Final    Comment:        The GeneXpert MRSA Assay (FDA approved for NASAL specimens only), is one component of a comprehensive MRSA colonization surveillance program. It is not intended to diagnose MRSA infection nor to guide or monitor treatment for MRSA infections.      Studies: No results found.  Scheduled Meds: . sodium chloride   Intravenous Once  . atorvastatin  40 mg Oral Daily  . ferrous sulfate  325 mg Oral Daily  . furosemide  20 mg Oral BID  . insulin aspart  0-15 Units Subcutaneous TID WC  . insulin glargine  18 Units Subcutaneous QHS  . lisinopril  5 mg Oral Daily  . metoprolol  50 mg Oral Daily  . pantoprazole  40 mg Oral Daily   . sodium chloride flush  3 mL Intravenous Q12H  . vitamin B-12  1,000 mcg Oral Daily   Continuous Infusions:   Principal Problem:   GIB (gastrointestinal bleeding) Active Problems:   Benign essential HTN   Arteriosclerosis of coronary artery   Controlled type 2 diabetes mellitus without complication (HCC)   Acid reflux   Cerebrovascular accident (CVA) ([place])   CKD (chronic kidney disease), stage III   Chronic systolic CHF (congestive heart failure) (HCC)   Stroke (cerebrum) (HCC)   Blood loss anemia   Hematochezia   Diverticulosis of large intestine  without hemorrhage   Hemorrhoid    Time spent: >35 minutes     Kinnie Feil  [facility] Pager [phone]. If 7PM-7AM, please contact night-coverage at [email or web address], password [facility] 10/22/2015, 1:20 PM  LOS: 4 days

## 2015-10-22 NOTE — Progress Notes (Signed)
CRITICAL VALUE ALERT  Critical value received: HGB 6.7  Date of notification:  10/22/15  Time of notification:  1250  Critical value read back:YES  Nurse who received alert: Lottie Dawson ,RN  MD notified (1st page):  DR  Daleen Bo  Time of first page:  1254  MD notified (2nd page):  Time of second page:  Responding MD:  DR Daleen Bo  Time MD responded:  [phone]

## 2015-10-23 DIAGNOSIS — K922 Gastrointestinal hemorrhage, unspecified: Secondary | ICD-10-CM

## 2015-10-23 DIAGNOSIS — E119 Type 2 diabetes mellitus without complications: Secondary | ICD-10-CM

## 2015-10-23 DIAGNOSIS — Z794 Long term (current) use of insulin: Secondary | ICD-10-CM

## 2015-10-23 DIAGNOSIS — I5022 Chronic systolic (congestive) heart failure: Secondary | ICD-10-CM

## 2015-10-23 DIAGNOSIS — I10 Essential (primary) hypertension: Secondary | ICD-10-CM

## 2015-10-23 LAB — TYPE AND SCREEN
ABO/RH(D): B POS
Antibody Screen: NEGATIVE
UNIT DIVISION: 0
UNIT DIVISION: 0

## 2015-10-23 LAB — GLUCOSE, CAPILLARY: Glucose-Capillary: 192 mg/dL — ABNORMAL HIGH (ref 65–99)

## 2015-10-23 LAB — CBC
HEMATOCRIT: 34.7 % — AB (ref 39.0–52.0)
HEMOGLOBIN: 11.5 g/dL — AB (ref 13.0–17.0)
MCH: 28.4 pg (ref 26.0–34.0)
MCHC: 33.1 g/dL (ref 30.0–36.0)
MCV: 85.7 fL (ref 78.0–100.0)
Platelets: 91 10*3/uL — ABNORMAL LOW (ref 150–400)
RBC: 4.05 MIL/uL — AB (ref 4.22–5.81)
RDW: 14.9 % (ref 11.5–15.5)
WBC: 11.6 10*3/uL — AB (ref 4.0–10.5)

## 2015-10-23 MED ORDER — RA ASPIRIN EC ADULT LOW ST 81 MG PO TBEC: 81.0000 mg | DELAYED_RELEASE_TABLET | Freq: Every day | ORAL | 0 refills | Status: DC

## 2015-10-23 NOTE — Care Management Note (Signed)
Case Management Note  Patient Details  Name: Ryan Mcclure MRN: [id number] Date of Birth: [birth year]  Subjective/Objective:                  GIB (gastrointestinal bleeding)  Action/Plan: CM spoke with the patient at the bedside. He lives at [facility] in [place]. He reports the home health services the community uses is not Medicare Certified and he needs a Conservation officer, nature [facility] provider. Patient provided with a list of [place] agencies for [facility]. CM attempted to contact [facility] to confirm, unable to reach a person. Per the web site their home health services offer PCA services. Patient selected [facility] for PT, OT and HHA. CM spoke with Mliss Sax at [facility], referral information provided. Faxed the order, face sheet, face to face, H&P and discharge summary to [facility].   Expected Discharge Date:  10/21/15               Expected Discharge Plan:  [place]  In-House Referral:     Discharge planning Services  CM Consult  Post Acute Care Choice:  Home Health Choice offered to:  Patient  DME Arranged:  N/A DME Agency:  NA  HH Arranged:  PT, OT, Nurse's Aide HH Agency:  [place]  Status of Service:  Completed, signed off  If discussed at [place] of Stay Meetings, dates discussed:    Additional Comments:  Apolonio Schneiders, RN 10/23/2015, 11:13 AM

## 2015-10-23 NOTE — Discharge Summary (Signed)
Physician Discharge Summary  Ryan Mcclure CLE:[id number] DOB: [birth year] DOA: 10/18/2015  PCP: [place] Pink, MD  Admit date: 10/18/2015 Discharge date: 10/23/2015  Time spent: >45 minutes  Recommendations for Outpatient Follow-up:  PCP in 7-10 days   Discharge Diagnoses:  Principal Problem:   GIB (gastrointestinal bleeding) Active Problems:   Benign essential HTN   Arteriosclerosis of coronary artery   Controlled type 2 diabetes mellitus without complication (HCC)   Acid reflux   Cerebrovascular accident (CVA) ([place])   CKD (chronic kidney disease), stage III   Chronic systolic CHF (congestive heart failure) ([place])   Stroke (cerebrum) (HCC)   Blood loss anemia   Hematochezia   Diverticulosis of large intestine without hemorrhage   Hemorrhoid   Discharge Condition: stable   Diet recommendation: low sodium. DM  Filed Weights   10/19/15 0500 10/21/15 1654 10/22/15 0600  Weight: 89.6 kg (197 lb 8.5 oz) 90.8 kg (200 lb 1.6 oz) 88.5 kg (195 lb 1.6 oz)    History of present illness:  82 y.o.malewith medical history significant of GIB, AVM, diverticulosis, hypertension, hyperlipidemia, diabetes mellitus, CAD, s/p of CABG, s/p of sent, s/p of AVR with bioprosthetic valve, PVD, s/p of bilateral carotid artery endarterectomy, CKD-III, who presented with rectal bleeding and generalized weakness. Underwent colonoscopy and received blood transfusion during hospital stay    Hospital Course:   Acute blood loss anemia due to GIB (gastrointestinal bleeding), thrombocytopenia. persistent bloody stools, and drop in hemoglobin on admission. Initially, he received two U PRBC 9/20and Hg appropriately up post transfusion. pt underwent colonoscopy -->diverticulosis, otherwise clear. Noted to have a drop in Hg at 6.7 on repeat labs and received 2 more units on 9/23. Hemoglobin improved appropriately to 11.5 with no s/s of acute bleeding. Recommended to f/u with PCP in 5-7 days to repeat CBC hold  aspirin for 7-10 days until seen by primary care   Essential HTN. stable on home lisinopriland metoprolol, lasix   Elevated trop and arteriosclerosis of coronary artery. s/p of CBAG and stent. Trop 0.03. No CP or SOB. Likely due to demandingischemia secondary to anemia. Patient had Myoview on 9/13/17at [facility] which showed hypokinesis of inferior wall with EF of 43%.  - no chest pain, d/w patient, recommended to hold aspirin for 7-10 days due to acute blood loss anemia requiring blood TF   DM-II with complications of nephropathy.  Last A1c 8.1 on 08/16/15, poorly controled. Patient is taking metformin, NovoLog and Lantus at home - continue insulin regimen, titrate as needed. D/c metformin due to renal issues   GERD: on PPI   Hx of Cerebrovascular accident (CVA) [facility]): Hold ASA with acute blood loss. F/u with PCP in 7 days to restart. continue lipitor  CKD (chronic kidney disease), stage III.  stable. Baseline creatinine 1.5-2.0 Obesity Body mass index is 30.94 kg/m. Chronic systolic CHF (congestive heart failure) ([place]) 2-D echo on 10/16/15 showed EF 40%. Patient does not have leg edema or JVD. CHF is compensated   Procedures:  Colonoscopy  (i.e. Studies not automatically included, echos, thoracentesis, etc; not x-rays)  Consultations:  GI  Discharge Exam: Vitals:   10/23/15 0005 10/23/15 0545  BP: (!) 141/71 (!) 157/83  Pulse: 70 73  Resp: 14 16  Temp: 98.4 F (36.9 C) 98.6 F (37 C)    General: no distress, alert Cardiovascular: s1,s2 rrr Respiratory: CTA BL  Discharge Instructions  Discharge Instructions    Call MD for:  persistant dizziness or light-headedness    Complete by:  As  directed    Diet - low sodium heart healthy    Complete by:  As directed    Discharge instructions    Complete by:  As directed    Please follow up with primary care doctor in 7-10 days   Increase activity slowly    Complete by:  As directed        Medication List    STOP  taking these medications   metFORMIN 500 MG tablet Commonly known as:  GLUCOPHAGE     TAKE these medications   atorvastatin 40 MG tablet Commonly known as:  LIPITOR Take 40 mg by mouth daily.   ferrous sulfate 325 (65 FE) MG tablet Take 325 mg by mouth daily.   fluticasone 50 MCG/ACT nasal spray Commonly known as:  FLONASE Place 1 spray into both nostrils daily as needed for allergies.   furosemide 40 MG tablet Commonly known as:  LASIX Take 20 mg by mouth 2 (two) times daily.   insulin aspart 100 UNIT/ML FlexPen Commonly known as:  NOVOLOG Take 6 units with breakfast, 8 units with lunch, 10 units with supper.   LANTUS SOLOSTAR 100 UNIT/ML Solostar Pen Generic drug:  Insulin Glargine Inject 26 Units into the skin at bedtime.   lisinopril 5 MG tablet Commonly known as:  PRINIVIL,ZESTRIL Take 5 mg by mouth daily.   metoprolol 50 MG tablet Commonly known as:  LOPRESSOR Take 50 mg by mouth daily.   NITROSTAT 0.4 MG SL tablet Generic drug:  nitroGLYCERIN take 1 tablet under the tongue every 5 minutes if needed for chest pain   omeprazole 40 MG capsule Commonly known as:  PRILOSEC Take 40 mg by mouth daily.   RA ASPIRIN EC ADULT LOW ST 81 MG EC tablet Generic drug:  aspirin Take 1 tablet (81 mg total) by mouth daily. Start taking on:  11/02/2015   RA VITAMIN B-12 TR 1000 MCG Tbcr Generic drug:  Cyanocobalamin Take 1 tablet by mouth daily.      Allergies  Allergen Reactions  . Amlodipine Nausea And Vomiting  . Neuromuscular Blocking Agents Nausea And Vomiting      The results of significant diagnostics from this hospitalization (including imaging, microbiology, ancillary and laboratory) are listed below for reference.    Significant Diagnostic Studies: No results found.  Microbiology: Recent Results (from the past 240 hour(s))  MRSA PCR Screening     Status: None   Collection Time: 10/18/15  8:51 PM  Result Value Ref Range Status   MRSA by PCR  NEGATIVE NEGATIVE Final    Comment:        The GeneXpert MRSA Assay (FDA approved for NASAL specimens only), is one component of a comprehensive MRSA colonization surveillance program. It is not intended to diagnose MRSA infection nor to guide or monitor treatment for MRSA infections.      Labs: Basic Metabolic Panel:  Recent Labs Lab 10/18/15 1634 10/19/15 0609 10/20/15 0403 10/21/15 0538 10/22/15 0407  NA 141 142 141 146* 140  K 4.4 3.9 4.5 4.1 3.9  CL 109 109 111 113* 109  CO2 27 24 23 23 25   GLUCOSE 126* 107* 182* 159* 246*  BUN 38* 32* 26* 18 16  CREATININE 1.84* 1.69* 1.61* 1.61* 2.01*  CALCIUM 8.7* 8.3* 8.1* 8.8* 8.6*   Liver Function Tests:  Recent Labs Lab 10/18/15 1634  AST 19  ALT 14*  ALKPHOS 69  BILITOT 0.4  PROT 6.2*  ALBUMIN 3.2*   No results for input(s): LIPASE, AMYLASE  in the last 168 hours. No results for input(s): AMMONIA in the last 168 hours. CBC:  Recent Labs Lab 10/20/15 0403 10/20/15 1117 10/21/15 0316 10/22/15 0407 10/22/15 1150 10/23/15 0353  WBC 12.9* 10.0 10.8* 11.9*  --  11.6*  HGB 9.8* 8.8* 9.9* 8.6* 6.7* 11.5*  HCT 29.8* 26.6* 29.7* 26.4* 20.9* 34.7*  MCV 87.6 84.4 85.1 86.6  --  85.7  PLT 85* 84* 60* 96*  --  91*   Cardiac Enzymes:  Recent Labs Lab 10/18/15 1634 10/18/15 2254 10/19/15 0609 10/19/15 1209  TROPONINI 0.03* 0.03* 0.03* 0.03*   BNP: BNP (last 3 results)  Recent Labs  10/18/15 2256  BNP 91.9    ProBNP (last 3 results) No results for input(s): PROBNP in the last 8760 hours.  CBG:  Recent Labs Lab 10/22/15 0804 10/22/15 1132 10/22/15 1652 10/22/15 2102 10/23/15 0749  GLUCAP 204* 191* 241* 270* 192*       Signed:  Rowe Clack N  [facility] 10/23/2015, 8:23 AM

## 2015-10-24 ENCOUNTER — Encounter (HOSPITAL_COMMUNITY): Payer: Self-pay | Admitting: Gastroenterology

## 2015-12-16 ENCOUNTER — Encounter: Payer: Self-pay | Admitting: Podiatry

## 2015-12-16 ENCOUNTER — Ambulatory Visit (INDEPENDENT_AMBULATORY_CARE_PROVIDER_SITE_OTHER): Payer: Medicare Other | Admitting: Podiatry

## 2015-12-16 VITALS — BP 189/88 | HR 60 | Resp 16

## 2015-12-16 DIAGNOSIS — E0843 Diabetes mellitus due to underlying condition with diabetic autonomic (poly)neuropathy: Secondary | ICD-10-CM

## 2015-12-16 DIAGNOSIS — M79609 Pain in unspecified limb: Secondary | ICD-10-CM

## 2015-12-16 DIAGNOSIS — L603 Nail dystrophy: Secondary | ICD-10-CM

## 2015-12-16 DIAGNOSIS — B351 Tinea unguium: Secondary | ICD-10-CM | POA: Diagnosis not present

## 2015-12-16 DIAGNOSIS — L608 Other nail disorders: Secondary | ICD-10-CM

## 2016-01-01 NOTE — Progress Notes (Signed)
SUBJECTIVE Patient with a history of diabetes mellitus presents to office today complaining of elongated, thickened nails. Pain while ambulating in shoes. Patient is unable to trim their own nails.   Allergies  Allergen Reactions  . Amlodipine Nausea And Vomiting  . Neuromuscular Blocking Agents Nausea And Vomiting    OBJECTIVE General Patient is awake, alert, and oriented x 3 and in no acute distress. Derm Skin is dry and supple bilateral. Negative open lesions or macerations. Remaining integument unremarkable. Nails are tender, long, thickened and dystrophic with subungual debris, consistent with onychomycosis, 1-5 bilateral. No signs of infection noted. Vasc  DP and PT pedal pulses palpable bilaterally. Temperature gradient within normal limits.  Neuro Epicritic and protective threshold sensation diminished bilaterally.  Musculoskeletal Exam No symptomatic pedal deformities noted bilateral. Muscular strength within normal limits.  ASSESSMENT 1. Diabetes Mellitus w/ peripheral neuropathy 2. Onychomycosis of nail due to dermatophyte bilateral 3. Pain in foot bilateral  PLAN OF CARE 1. Patient evaluated today. 2. Instructed to maintain good pedal hygiene and foot care. Stressed importance of controlling blood sugar.  3. Mechanical debridement of nails 1-5 bilaterally performed using a nail nipper. Filed with dremel without incident.  4. Return to clinic in 3 mos.    Edrick Kins, DPM

## 2016-01-27 ENCOUNTER — Other Ambulatory Visit (INDEPENDENT_AMBULATORY_CARE_PROVIDER_SITE_OTHER): Payer: Self-pay | Admitting: Vascular Surgery

## 2016-01-27 DIAGNOSIS — I6523 Occlusion and stenosis of bilateral carotid arteries: Secondary | ICD-10-CM

## 2016-01-31 ENCOUNTER — Ambulatory Visit (INDEPENDENT_AMBULATORY_CARE_PROVIDER_SITE_OTHER): Payer: Medicare Other

## 2016-01-31 ENCOUNTER — Encounter (INDEPENDENT_AMBULATORY_CARE_PROVIDER_SITE_OTHER): Payer: Self-pay | Admitting: Vascular Surgery

## 2016-01-31 ENCOUNTER — Ambulatory Visit (INDEPENDENT_AMBULATORY_CARE_PROVIDER_SITE_OTHER): Payer: Medicare Other | Admitting: Vascular Surgery

## 2016-01-31 VITALS — BP 208/94 | HR 68 | Resp 16 | Ht 67.0 in | Wt 200.0 lb

## 2016-01-31 DIAGNOSIS — E782 Mixed hyperlipidemia: Secondary | ICD-10-CM | POA: Diagnosis not present

## 2016-01-31 DIAGNOSIS — Z794 Long term (current) use of insulin: Secondary | ICD-10-CM

## 2016-01-31 DIAGNOSIS — I6523 Occlusion and stenosis of bilateral carotid arteries: Secondary | ICD-10-CM

## 2016-01-31 DIAGNOSIS — I10 Essential (primary) hypertension: Secondary | ICD-10-CM | POA: Diagnosis not present

## 2016-01-31 DIAGNOSIS — E119 Type 2 diabetes mellitus without complications: Secondary | ICD-10-CM | POA: Diagnosis not present

## 2016-01-31 DIAGNOSIS — I6529 Occlusion and stenosis of unspecified carotid artery: Secondary | ICD-10-CM | POA: Insufficient documentation

## 2016-01-31 NOTE — Assessment & Plan Note (Signed)
lipid control important in reducing the progression of atherosclerotic disease. Continue statin therapy  

## 2016-01-31 NOTE — Assessment & Plan Note (Signed)
The patient is almost 10 years status post bilateral carotid endarterectomies for high-grade stenosis. He is doing well. His carotid duplex today demonstrates widely patent carotid endarterectomy sites bilaterally without recurrent stenosis. He has no current issues. He will continue his aspirin and statin agent. We will plan to recheck him in 1 year.

## 2016-01-31 NOTE — Assessment & Plan Note (Signed)
blood pressure control important in reducing the progression of atherosclerotic disease. On appropriate oral medications.  

## 2016-01-31 NOTE — Assessment & Plan Note (Signed)
blood glucose control important in reducing the progression of atherosclerotic disease. Also, involved in wound healing. On appropriate medications.  

## 2016-01-31 NOTE — Progress Notes (Signed)
MRN : [id number]  Ryan Mcclure is a 83 y.o. ([birth year]) male who presents with chief complaint of  Chief Complaint  Patient presents with  . Follow-up  .  History of Present Illness: Patient returns in follow-up of his carotid disease. He is almost 10 years status post bilateral carotid endarterectomies for high-grade stenosis. He is doing well and does not have any complaints or new focal neurologic symptoms today. His carotid duplex today demonstrates widely patent carotid endarterectomy sites bilaterally without recurrent stenosis.  Current Outpatient Prescriptions  Medication Sig Dispense Refill  . atorvastatin (LIPITOR) 40 MG tablet Take 40 mg by mouth daily.    . Cyanocobalamin (RA VITAMIN B-12 TR) 1000 MCG TBCR Take 1 tablet by mouth daily.     . ferrous sulfate 325 (65 FE) MG tablet Take 325 mg by mouth daily.   1  . fluticasone (FLONASE) 50 MCG/ACT nasal spray Place 1 spray into both nostrils daily as needed for allergies.     . furosemide (LASIX) 40 MG tablet Take 20 mg by mouth 2 (two) times daily.     . insulin aspart (NOVOLOG) 100 UNIT/ML FlexPen Take 6 units with breakfast, 8 units with lunch, 10 units with supper.    . Insulin Glargine (LANTUS SOLOSTAR) 100 UNIT/ML Solostar Pen Inject 26 Units into the skin at bedtime.     Marland Kitchen lisinopril (PRINIVIL,ZESTRIL) 5 MG tablet Take 5 mg by mouth daily.  0  . metoprolol (LOPRESSOR) 50 MG tablet Take 50 mg by mouth daily.   0  . nitroGLYCERIN (NITROSTAT) 0.4 MG SL tablet take 1 tablet under the tongue every 5 minutes if needed for chest pain    . omeprazole (PRILOSEC) 40 MG capsule Take 40 mg by mouth daily.    Marland Kitchen RA ASPIRIN EC ADULT LOW ST 81 MG EC tablet Take 1 tablet (81 mg total) by mouth daily. 30 tablet 0   No current facility-administered medications for this visit.     Past Medical History:  Diagnosis Date  . Chronic systolic CHF (congestive heart failure) ([place])   . CKD (chronic kidney disease), stage III   . Diabetes  ([place])   . HBP (high blood pressure)   . Heart attack   . High cholesterol   . Stroke (cerebrum) ([place])   . Swelling     Past Surgical History:  Procedure Laterality Date  . COLONOSCOPY N/A 10/21/2015   Procedure: COLONOSCOPY;  Surgeon: Milus Banister, MD;  Location: [facility] ENDOSCOPY;  Service: Endoscopy;  Laterality: N/A;  . EXPLORATION POST OPERATIVE OPEN HEART    . EYE SURGERY Bilateral     Social History Social History  Substance Use Topics  . Smoking status: Former Smoker    Types: Cigarettes    Quit date: 01/30/1995  . Smokeless tobacco: Never Used  . Alcohol use No      Family History Family History  Problem Relation Age of Onset  . Diabetes Mellitus I Mother   . Alcoholism Father   . Heart attack Sister   . Prostate cancer Brother      Allergies  Allergen Reactions  . Amlodipine Nausea And Vomiting  . Neuromuscular Blocking Agents Nausea And Vomiting     REVIEW OF SYSTEMS (Negative unless checked)  Constitutional: [] Weight loss  [] Fever  [] Chills Cardiac: [] Chest pain   [] Chest pressure   [] Palpitations   [] Shortness of breath when laying flat   [] Shortness of breath at rest   [] Shortness of breath with exertion.  Vascular:  [] Pain in legs with walking   [] Pain in legs at rest   [] Pain in legs when laying flat   [] Claudication   [] Pain in feet when walking  [] Pain in feet at rest  [] Pain in feet when laying flat   [] History of DVT   [] Phlebitis   [] Swelling in legs   [] Varicose veins   [] Non-healing ulcers Pulmonary:   [] Uses home oxygen   [] Productive cough   [] Hemoptysis   [] Wheeze  [] COPD   [] Asthma Neurologic:  [] Dizziness  [] Blackouts   [] Seizures   [x] History of stroke   [] History of TIA  [] Aphasia   [] Temporary blindness   [] Dysphagia   [] Weakness or numbness in arms   [] Weakness or numbness in legs Musculoskeletal:  [x] Arthritis   [] Joint swelling   [] Joint pain   [] Low back pain Hematologic:  [] Easy bruising  [] Easy bleeding   [] Hypercoagulable state    [] Anemic  [] Hepatitis Gastrointestinal:  [] Blood in stool   [] Vomiting blood  [] Gastroesophageal reflux/heartburn   [] Difficulty swallowing. Genitourinary:  [x] Chronic kidney disease   [] Difficult urination  [] Frequent urination  [] Burning with urination   [] Blood in urine Skin:  [] Rashes   [] Ulcers   [] Wounds Psychological:  [] History of anxiety   []  History of major depression.  Physical Examination  Vitals:   01/31/16 1354 01/31/16 1355  BP: (!) 198/85 (!) 208/94  Pulse: 68   Resp: 16   Weight: 200 lb (90.7 kg)   Height: 5\' 7"  (1.702 m)    Body mass index is 31.32 kg/m. Gen:  WD/WN, NAD Head: [place]/AT, No temporalis wasting. Ear/Nose/Throat: Hearing grossly intact, nares w/o erythema or drainage, trachea midline Eyes: Conjunctiva clear. Sclera non-icteric Neck: Supple.  No bruit or JVD.  Pulmonary:  Good air movement, equal and clear to auscultation bilaterally.  Cardiac: RRR, normal S1, S2, no Murmurs, rubs or gallops. Vascular:  Vessel Right Left  Radial Palpable Palpable  Ulnar Palpable Palpable  Brachial Palpable Palpable  Carotid Palpable, without bruit Palpable, without bruit  Aorta Not palpable N/A  Femoral Palpable Palpable  Popliteal Palpable Palpable  PT Palpable Palpable  DP Palpable Palpable   Gastrointestinal: soft, non-tender/non-distended. No guarding/reflex.  Musculoskeletal: M/S 5/5 throughout.  No deformity or atrophy. Walks with a cane Neurologic: CN 2-12 intact. Sensation grossly intact in extremities.  Symmetrical.  Speech is fluent. Motor exam as listed above. Psychiatric: Judgment intact, Mood & affect appropriate for pt's clinical situation. Dermatologic: No rashes or ulcers noted.  No cellulitis or open wounds. Lymph : No Cervical, Axillary, or Inguinal lymphadenopathy.     CBC Lab Results  Component Value Date   WBC 11.6 (H) 10/23/2015   HGB 11.5 (L) 10/23/2015   HCT 34.7 (L) 10/23/2015   MCV 85.7 10/23/2015   PLT 91 (L) 10/23/2015     BMET    Component Value Date/Time   NA 140 10/22/2015 0407   NA 142 07/11/2012 1948   K 3.9 10/22/2015 0407   K 4.1 07/11/2012 1948   CL 109 10/22/2015 0407   CL 107 07/11/2012 1948   CO2 25 10/22/2015 0407   CO2 27 07/11/2012 1948   GLUCOSE 246 (H) 10/22/2015 0407   GLUCOSE 154 (H) 07/11/2012 1948   BUN 16 10/22/2015 0407   BUN 32 (H) 07/11/2012 1948   CREATININE 2.01 (H) 10/22/2015 0407   CREATININE 2.02 (H) 07/11/2012 1948   CALCIUM 8.6 (L) 10/22/2015 0407   CALCIUM 8.7 07/11/2012 1948   GFRNONAA 29 (L) 10/22/2015 0407  GFRNONAA 31 (L) 07/11/2012 1948   GFRAA 34 (L) 10/22/2015 0407   GFRAA 36 (L) 07/11/2012 1948   CrCl cannot be calculated (Patient's most recent lab result is older than the maximum 21 days allowed.).  COAG Lab Results  Component Value Date   INR 1.06 10/18/2015   INR 1.3 02/22/2011    Radiology No results found.    Assessment/Plan Controlled type 2 diabetes mellitus without complication (HCC) blood glucose control important in reducing the progression of atherosclerotic disease. Also, involved in wound healing. On appropriate medications.   Combined fat and carbohydrate induced hyperlipemia lipid control important in reducing the progression of atherosclerotic disease. Continue statin therapy   Benign essential HTN blood pressure control important in reducing the progression of atherosclerotic disease. On appropriate oral medications.   Carotid stenosis The patient is almost 10 years status post bilateral carotid endarterectomies for high-grade stenosis. He is doing well. His carotid duplex today demonstrates widely patent carotid endarterectomy sites bilaterally without recurrent stenosis. He has no current issues. He will continue his aspirin and statin agent. We will plan to recheck him in 1 year.    Leotis Pain, MD  01/31/2016 2:20 PM    This note was created with Dragon medical transcription system.  Any errors from dictation  are purely unintentional

## 2016-03-20 ENCOUNTER — Encounter: Payer: Self-pay | Admitting: Podiatry

## 2016-03-20 ENCOUNTER — Ambulatory Visit (INDEPENDENT_AMBULATORY_CARE_PROVIDER_SITE_OTHER): Payer: Medicare Other | Admitting: Podiatry

## 2016-03-20 DIAGNOSIS — B351 Tinea unguium: Secondary | ICD-10-CM

## 2016-03-20 DIAGNOSIS — M79609 Pain in unspecified limb: Secondary | ICD-10-CM

## 2016-03-20 DIAGNOSIS — L603 Nail dystrophy: Secondary | ICD-10-CM | POA: Diagnosis not present

## 2016-03-20 DIAGNOSIS — L608 Other nail disorders: Secondary | ICD-10-CM

## 2016-03-20 DIAGNOSIS — E0843 Diabetes mellitus due to underlying condition with diabetic autonomic (poly)neuropathy: Secondary | ICD-10-CM | POA: Diagnosis not present

## 2016-03-20 NOTE — Progress Notes (Signed)
   SUBJECTIVE Patient with a history of diabetes mellitus presents to office today complaining of elongated, thickened nails. Pain while ambulating in shoes. Patient is unable to trim their own nails.   OBJECTIVE General Patient is awake, alert, and oriented x 3 and in no acute distress. Derm Skin is dry and supple bilateral. Negative open lesions or macerations. Remaining integument unremarkable. Nails are tender, long, thickened and dystrophic with subungual debris, consistent with onychomycosis, 1-5 bilateral. No signs of infection noted. Vasc  DP and PT pedal pulses palpable bilaterally. Temperature gradient within normal limits.  Neuro Epicritic and protective threshold sensation diminished bilaterally.  Musculoskeletal Exam No symptomatic pedal deformities noted bilateral. Muscular strength within normal limits.  ASSESSMENT 1. Diabetes Mellitus w/ peripheral neuropathy 2. Onychomycosis of nail due to dermatophyte bilateral 3. Pain in foot bilateral  PLAN OF CARE 1. Patient evaluated today. 2. Instructed to maintain good pedal hygiene and foot care. Stressed importance of controlling blood sugar.  3. Mechanical debridement of nails 1-5 bilaterally performed using a nail nipper. Filed with dremel without incident.  4. Return to clinic in 3 mos.     Brent M. Evans, DPM [facility]  Dr. Brent M. Evans, DPM    [address]                                        [place], [place] [address]                Office [phone]  Fax [phone]       

## 2016-06-21 ENCOUNTER — Ambulatory Visit (INDEPENDENT_AMBULATORY_CARE_PROVIDER_SITE_OTHER): Payer: Medicare Other | Admitting: Podiatry

## 2016-06-21 ENCOUNTER — Encounter: Payer: Self-pay | Admitting: Podiatry

## 2016-06-21 DIAGNOSIS — E1142 Type 2 diabetes mellitus with diabetic polyneuropathy: Secondary | ICD-10-CM

## 2016-06-21 DIAGNOSIS — B351 Tinea unguium: Secondary | ICD-10-CM | POA: Diagnosis not present

## 2016-06-21 DIAGNOSIS — M79609 Pain in unspecified limb: Secondary | ICD-10-CM

## 2016-06-21 NOTE — Progress Notes (Signed)
Complaint:  Visit Type: Patient returns to my office for continued preventative foot care services. Complaint: Patient states" my nails have grown long and thick and become painful to walk and wear shoes" Patient has been diagnosed with DM with no foot complications. The patient presents for preventative foot care services. No changes to ROS  Podiatric Exam: Vascular: dorsalis pedis and posterior tibial pulses are weakly palpable bilateral. Capillary return is immediate. Temperature gradient is WNL. Skin turgor WNL  Sensorium: Normal Semmes Weinstein monofilament test. Normal tactile sensation bilaterally. Nail Exam: Pt has thick disfigured discolored nails with subungual debris noted bilateral entire nail hallux through fifth toenails Ulcer Exam: There is no evidence of ulcer or pre-ulcerative changes or infection. Orthopedic Exam: Muscle tone and strength are WNL. No limitations in general ROM. No crepitus or effusions noted. Foot type and digits show no abnormalities. Bony prominences are unremarkable. Dorsal exostosis  B/l Skin: No Porokeratosis. No infection or ulcers  Diagnosis:  Onychomycosis, , Pain in right toe, pain in left toes  Treatment & Plan Procedures and Treatment: Consent by patient was obtained for treatment procedures. The patient understood the discussion of treatment and procedures well. All questions were answered thoroughly reviewed. Debridement of mycotic and hypertrophic toenails, 1 through 5 bilateral and clearing of subungual debris. No ulceration, no infection noted.  Return Visit-Office Procedure: Patient instructed to return to the office for a follow up visit 3 months for continued evaluation and treatment.    Gardiner Barefoot DPM

## 2016-09-20 ENCOUNTER — Ambulatory Visit (INDEPENDENT_AMBULATORY_CARE_PROVIDER_SITE_OTHER): Payer: Medicare Other | Admitting: Podiatry

## 2016-09-20 ENCOUNTER — Encounter: Payer: Self-pay | Admitting: Podiatry

## 2016-09-20 DIAGNOSIS — M79609 Pain in unspecified limb: Secondary | ICD-10-CM

## 2016-09-20 DIAGNOSIS — B351 Tinea unguium: Secondary | ICD-10-CM

## 2016-09-20 DIAGNOSIS — E119 Type 2 diabetes mellitus without complications: Secondary | ICD-10-CM

## 2016-09-20 NOTE — Progress Notes (Signed)
Complaint:  Visit Type: Patient returns to my office for continued preventative foot care services. Complaint: Patient states" my nails have grown long and thick and become painful to walk and wear shoes" Patient has been diagnosed with DM with no foot complications. The patient presents for preventative foot care services. No changes to ROS  Podiatric Exam: Vascular: dorsalis pedis and posterior tibial pulses are weakly palpable bilateral. Capillary return is immediate. Temperature gradient is WNL. Skin turgor WNL  Sensorium: Normal Semmes Weinstein monofilament test. Normal tactile sensation bilaterally. Nail Exam: Pt has thick disfigured discolored nails with subungual debris noted bilateral entire nail hallux through fifth toenails Ulcer Exam: There is no evidence of ulcer or pre-ulcerative changes or infection. Orthopedic Exam: Muscle tone and strength are WNL. No limitations in general ROM. No crepitus or effusions noted. Foot type and digits show no abnormalities. Bony prominences are unremarkable. Dorsal exostosis  B/l Skin: No Porokeratosis. No infection or ulcers  Diagnosis:  Onychomycosis, , Pain in right toe, pain in left toes  Treatment & Plan Procedures and Treatment: Consent by patient was obtained for treatment procedures. The patient understood the discussion of treatment and procedures well. All questions were answered thoroughly reviewed. Debridement of mycotic and hypertrophic toenails, 1 through 5 bilateral and clearing of subungual debris. No ulceration, no infection noted.  Return Visit-Office Procedure: Patient instructed to return to the office for a follow up visit 3 months for continued evaluation and treatment.    Gardiner Barefoot DPM

## 2016-12-27 ENCOUNTER — Encounter: Payer: Self-pay | Admitting: Podiatry

## 2016-12-27 ENCOUNTER — Ambulatory Visit (INDEPENDENT_AMBULATORY_CARE_PROVIDER_SITE_OTHER): Payer: Medicare Other | Admitting: Podiatry

## 2016-12-27 DIAGNOSIS — M79609 Pain in unspecified limb: Secondary | ICD-10-CM | POA: Diagnosis not present

## 2016-12-27 DIAGNOSIS — E119 Type 2 diabetes mellitus without complications: Secondary | ICD-10-CM | POA: Diagnosis not present

## 2016-12-27 DIAGNOSIS — B351 Tinea unguium: Secondary | ICD-10-CM

## 2016-12-27 DIAGNOSIS — E1142 Type 2 diabetes mellitus with diabetic polyneuropathy: Secondary | ICD-10-CM | POA: Diagnosis not present

## 2016-12-27 NOTE — Progress Notes (Addendum)
Complaint:  Visit Type: Patient returns to my office for continued preventative foot care services. Complaint: Patient states" my nails have grown long and thick and become painful to walk and wear shoes" Patient has been diagnosed with DM with no foot complications. The patient presents for preventative foot care services. No changes to ROS  Podiatric Exam: Vascular: dorsalis pedis and posterior tibial pulses are weakly palpable bilateral. Capillary return is immediate. Temperature gradient is WNL. Skin turgor WNL  Sensorium: Normal Semmes Weinstein monofilament test. Normal tactile sensation bilaterally. Nail Exam: Pt has thick disfigured discolored nails with subungual debris noted bilateral entire nail hallux through fifth toenails Ulcer Exam: There is no evidence of ulcer or pre-ulcerative changes or infection. Orthopedic Exam: Muscle tone and strength are WNL. No limitations in general ROM. No crepitus or effusions noted. Foot type and digits show no abnormalities. Bony prominences are unremarkable. Dorsal exostosis  B/l Skin: No Porokeratosis. No infection or ulcers  Diagnosis:  Onychomycosis, , Pain in right toe, pain in left toes  Treatment & Plan Procedures and Treatment: Consent by patient was obtained for treatment procedures. The patient understood the discussion of treatment and procedures well. All questions were answered thoroughly reviewed. Debridement of mycotic and hypertrophic toenails, 1 through 5 bilateral and clearing of subungual debris. No ulceration, no infection noted. ABN signed for 2018. Return Visit-Office Procedure: Patient instructed to return to the office for a follow up visit 3 months for continued evaluation and treatment.    Gardiner Barefoot DPM

## 2017-01-09 ENCOUNTER — Encounter (INDEPENDENT_AMBULATORY_CARE_PROVIDER_SITE_OTHER): Payer: Self-pay | Admitting: Vascular Surgery

## 2017-02-01 ENCOUNTER — Ambulatory Visit (INDEPENDENT_AMBULATORY_CARE_PROVIDER_SITE_OTHER): Payer: Medicare Other | Admitting: Vascular Surgery

## 2017-02-01 ENCOUNTER — Ambulatory Visit (INDEPENDENT_AMBULATORY_CARE_PROVIDER_SITE_OTHER): Payer: Medicare Other

## 2017-02-01 ENCOUNTER — Encounter (INDEPENDENT_AMBULATORY_CARE_PROVIDER_SITE_OTHER): Payer: Medicare Other

## 2017-02-01 DIAGNOSIS — I6523 Occlusion and stenosis of bilateral carotid arteries: Secondary | ICD-10-CM

## 2017-02-01 LAB — VAS US CAROTID
LCCAPDIAS: 12 cm/s
LEFT ECA DIAS: 0 cm/s
LEFT VERTEBRAL DIAS: 18 cm/s
LICADSYS: -72 cm/s
LICAPDIAS: -24 cm/s
LICAPSYS: -66 cm/s
Left CCA dist dias: -25 cm/s
Left CCA dist sys: -67 cm/s
Left CCA prox sys: 60 cm/s
Left ICA dist dias: -28 cm/s
RCCAPSYS: 70 cm/s
RIGHT CCA MID DIAS: 11 cm/s
RIGHT ECA DIAS: 14 cm/s
RIGHT VERTEBRAL DIAS: 17 cm/s
Right CCA prox dias: 13 cm/s
Right cca dist sys: -55 cm/s

## 2017-02-08 ENCOUNTER — Encounter (INDEPENDENT_AMBULATORY_CARE_PROVIDER_SITE_OTHER): Payer: Self-pay

## 2017-03-28 ENCOUNTER — Encounter: Payer: Self-pay | Admitting: Podiatry

## 2017-03-28 ENCOUNTER — Ambulatory Visit (INDEPENDENT_AMBULATORY_CARE_PROVIDER_SITE_OTHER): Payer: Medicare Other | Admitting: Podiatry

## 2017-03-28 DIAGNOSIS — M79676 Pain in unspecified toe(s): Secondary | ICD-10-CM

## 2017-03-28 DIAGNOSIS — B351 Tinea unguium: Secondary | ICD-10-CM

## 2017-03-28 DIAGNOSIS — E119 Type 2 diabetes mellitus without complications: Secondary | ICD-10-CM

## 2017-03-28 DIAGNOSIS — M79609 Pain in unspecified limb: Principal | ICD-10-CM

## 2017-03-28 NOTE — Progress Notes (Signed)
Complaint:  Visit Type: Patient returns to my office for continued preventative foot care services. Complaint: Patient states" my nails have grown long and thick and become painful to walk and wear shoes" Patient has been diagnosed with DM with no foot complications. The patient presents for preventative foot care services. No changes to ROS  Podiatric Exam: Vascular: dorsalis pedis and posterior tibial pulses are weakly palpable bilateral. Capillary return is immediate. Temperature gradient is WNL. Skin turgor WNL  Sensorium: Normal Semmes Weinstein monofilament test. Normal tactile sensation bilaterally. Nail Exam: Pt has thick disfigured discolored nails with subungual debris noted bilateral entire nail hallux through fifth toenails Ulcer Exam: There is no evidence of ulcer or pre-ulcerative changes or infection. Orthopedic Exam: Muscle tone and strength are WNL. No limitations in general ROM. No crepitus or effusions noted. Foot type and digits show no abnormalities. Bony prominences are unremarkable. Dorsal exostosis  B/l Skin: No Porokeratosis. No infection or ulcers  Diagnosis:  Onychomycosis, , Pain in right toe, pain in left toes  Treatment & Plan Procedures and Treatment: Consent by patient was obtained for treatment procedures. The patient understood the discussion of treatment and procedures well. All questions were answered thoroughly reviewed. Debridement of mycotic and hypertrophic toenails, 1 through 5 bilateral and clearing of subungual debris. No ulceration, no infection noted. ABN signed for 2019. Return Visit-Office Procedure: Patient instructed to return to the office for a follow up visit 3 months for continued evaluation and treatment.    Finleigh Cheong DPM 

## 2017-06-21 ENCOUNTER — Emergency Department
Admission: EM | Admit: 2017-06-21 | Discharge: 2017-06-21 | Disposition: A | Discharge status: Home / Self Care | Payer: Medicare Other | Attending: Emergency Medicine | Admitting: Emergency Medicine

## 2017-06-21 ENCOUNTER — Other Ambulatory Visit: Payer: Self-pay

## 2017-06-21 ENCOUNTER — Encounter: Payer: Self-pay | Admitting: Emergency Medicine

## 2017-06-21 ENCOUNTER — Emergency Department: Payer: Medicare Other

## 2017-06-21 DIAGNOSIS — M79661 Pain in right lower leg: Secondary | ICD-10-CM

## 2017-06-21 DIAGNOSIS — I5022 Chronic systolic (congestive) heart failure: Secondary | ICD-10-CM | POA: Diagnosis not present

## 2017-06-21 DIAGNOSIS — Z794 Long term (current) use of insulin: Secondary | ICD-10-CM | POA: Diagnosis not present

## 2017-06-21 DIAGNOSIS — Z789 Other specified health status: Secondary | ICD-10-CM

## 2017-06-21 DIAGNOSIS — I130 Hypertensive heart and chronic kidney disease with heart failure and stage 1 through stage 4 chronic kidney disease, or unspecified chronic kidney disease: Secondary | ICD-10-CM | POA: Insufficient documentation

## 2017-06-21 DIAGNOSIS — Z7982 Long term (current) use of aspirin: Secondary | ICD-10-CM | POA: Diagnosis not present

## 2017-06-21 DIAGNOSIS — R2241 Localized swelling, mass and lump, right lower limb: Secondary | ICD-10-CM | POA: Diagnosis not present

## 2017-06-21 DIAGNOSIS — M7121 Synovial cyst of popliteal space [Baker], right knee: Secondary | ICD-10-CM | POA: Diagnosis not present

## 2017-06-21 DIAGNOSIS — R600 Localized edema: Secondary | ICD-10-CM

## 2017-06-21 DIAGNOSIS — E1122 Type 2 diabetes mellitus with diabetic chronic kidney disease: Secondary | ICD-10-CM | POA: Insufficient documentation

## 2017-06-21 DIAGNOSIS — Z87891 Personal history of nicotine dependence: Secondary | ICD-10-CM | POA: Diagnosis not present

## 2017-06-21 DIAGNOSIS — M7989 Other specified soft tissue disorders: Secondary | ICD-10-CM

## 2017-06-21 DIAGNOSIS — Z8673 Personal history of transient ischemic attack (TIA), and cerebral infarction without residual deficits: Secondary | ICD-10-CM | POA: Diagnosis not present

## 2017-06-21 DIAGNOSIS — Z79899 Other long term (current) drug therapy: Secondary | ICD-10-CM | POA: Diagnosis not present

## 2017-06-21 DIAGNOSIS — N183 Chronic kidney disease, stage 3 (moderate): Secondary | ICD-10-CM | POA: Insufficient documentation

## 2017-06-21 LAB — BASIC METABOLIC PANEL
Anion gap: 6 (ref 5–15)
BUN: 28 mg/dL — AB (ref 6–20)
CHLORIDE: 105 mmol/L (ref 101–111)
CO2: 26 mmol/L (ref 22–32)
Calcium: 8.7 mg/dL — ABNORMAL LOW (ref 8.9–10.3)
Creatinine, Ser: 1.75 mg/dL — ABNORMAL HIGH (ref 0.61–1.24)
GFR calc Af Amer: 40 mL/min — ABNORMAL LOW (ref >60)
GFR, EST NON AFRICAN AMERICAN: 34 mL/min — AB (ref >60)
Glucose, Bld: 354 mg/dL — ABNORMAL HIGH (ref 65–99)
Potassium: 4.4 mmol/L (ref 3.5–5.1)
SODIUM: 137 mmol/L (ref 135–145)

## 2017-06-21 LAB — CBC
HEMATOCRIT: 31.3 % — AB (ref 40.0–52.0)
Hemoglobin: 9.9 g/dL — ABNORMAL LOW (ref 13.0–18.0)
MCH: 26.8 pg (ref 26.0–34.0)
MCHC: 31.7 g/dL — AB (ref 32.0–36.0)
MCV: 84.3 fL (ref 80.0–100.0)
Platelets: 103 10*3/uL — ABNORMAL LOW (ref 150–440)
RBC: 3.72 MIL/uL — ABNORMAL LOW (ref 4.40–5.90)
RDW: 14.1 % (ref 11.5–14.5)
WBC: 8.6 10*3/uL (ref 3.8–10.6)

## 2017-06-21 MED ORDER — BLOOD GLUCOSE MONITOR KIT: PACK | 0 refills | Status: DC

## 2017-06-21 MED ORDER — MEDICAL COMPRESSION STOCKINGS MISC: 0 refills | Status: DC

## 2017-06-21 NOTE — ED Provider Notes (Signed)
[facility] Emergency Department Provider Note  Time seen: 5:22 PM  I have reviewed the triage vital signs and the nursing notes.   HISTORY  Chief Complaint Leg Swelling    HPI Ryan Mcclure is a 84 y.o. male with a past medical history of CHF, CKD, diabetes, hyperlipidemia, peripheral edema, presents to the emergency department for right lower extremity swelling.  According to the patient they recently traveled to [place] for a funeral, states for the past 3 days he has had pain and swelling in his right leg especially in the right calf.  Denies any history of DVT previously.  States mild discomfort currently.   Past Medical History:  Diagnosis Date  . Chronic systolic CHF (congestive heart failure) ([place])   . CKD (chronic kidney disease), stage III ([place])   . Diabetes ([place])   . HBP (high blood pressure)   . Heart attack ([place])   . High cholesterol   . Stroke (cerebrum) ([place])   . Swelling     Patient Active Problem List   Diagnosis Date Noted  . Carotid stenosis 01/31/2016  . Hematochezia   . Diverticulosis of large intestine without hemorrhage   . Hemorrhoid   . GIB (gastrointestinal bleeding) 10/18/2015  . Blood loss anemia 10/18/2015  . CKD (chronic kidney disease), stage III ([place])   . Chronic systolic CHF (congestive heart failure) ([place])   . Stroke (cerebrum) ([place])   . Absolute anemia 06/10/2015  . Cataract cortical, senile 06/10/2015  . Controlled type 2 diabetes mellitus without complication ([place]) 17/61/6073  . Acid reflux 06/10/2015  . Combined fat and carbohydrate induced hyperlipemia 06/10/2015  . Cerebrovascular accident (CVA) ([place]) 06/10/2015  . Non-pressure chronic ulcer of skin of other sites with unspecified severity ([place]) 06/10/2015  . Benign essential HTN 08/25/2014  . Arteriosclerosis of coronary artery 09/09/2013  . Microalbuminuria 09/08/2013  . Diabetes mellitus ([place]) 09/08/2013    Past Surgical History:  Procedure  Laterality Date  . COLONOSCOPY N/A 10/21/2015   Procedure: COLONOSCOPY;  Surgeon: Milus Banister, MD;  Location: [facility];  Service: Endoscopy;  Laterality: N/A;  . EXPLORATION POST OPERATIVE OPEN HEART    . EYE SURGERY Bilateral     Prior to Admission medications   Medication Sig Start Date End Date Taking? Authorizing Provider  atorvastatin (LIPITOR) 40 MG tablet Take 40 mg by mouth daily.    [provider]  Cyanocobalamin (RA VITAMIN B-12 TR) 1000 MCG TBCR Take 1 tablet by mouth daily.     [provider]  ferrous sulfate 325 (65 FE) MG tablet Take 325 mg by mouth daily.  09/07/15   [provider]  fluticasone (FLONASE) 50 MCG/ACT nasal spray Place 1 spray into both nostrils daily as needed for allergies.  06/03/13   [provider]  furosemide (LASIX) 40 MG tablet Take 20 mg by mouth 2 (two) times daily.     [provider]  insulin aspart (NOVOLOG) 100 UNIT/ML FlexPen Take 6 units with breakfast, 8 units with lunch, 10 units with supper. 08/23/15   [provider]  Insulin Glargine (LANTUS SOLOSTAR) 100 UNIT/ML Solostar Pen Inject 26 Units into the skin at bedtime.  05/03/15   [provider]  lisinopril (PRINIVIL,ZESTRIL) 5 MG tablet Take 5 mg by mouth daily. 10/13/15   [provider]  metoprolol (LOPRESSOR) 50 MG tablet Take 50 mg by mouth daily.  10/13/15   [provider]  nitroGLYCERIN (NITROSTAT) 0.4 MG SL tablet take  1 tablet under the tongue every 5 minutes if needed for chest pain 10/29/14   [provider]  omeprazole (PRILOSEC) 40 MG capsule Take 40 mg by mouth daily.    [provider]  RA ASPIRIN EC ADULT LOW ST 81 MG EC tablet Take 1 tablet (81 mg total) by mouth daily. 11/02/15   Kinnie Feil, MD    Allergies  Allergen Reactions  . Neuromuscular Blocking Agents Nausea And Vomiting and Other (See Comments)    Tongue swelling, lips swelling   . Other Other (See Comments)     Tongue/lips swelling with steroid dose pack, pt does not recall exact name.   . Amlodipine Nausea And Vomiting    Family History  Problem Relation Age of Onset  . Diabetes Mellitus I Mother   . Alcoholism Father   . Heart attack Sister   . Prostate cancer Brother     Social History Social History   Tobacco Use  . Smoking status: Former Smoker    Types: Cigarettes    Last attempt to quit: 01/30/1995    Years since quitting: 22.4  . Smokeless tobacco: Never Used  Substance Use Topics  . Alcohol use: No  . Drug use: No    Review of Systems Constitutional: Negative for fever. Eyes: Negative for visual complaints ENT: Negative for recent illness/congestion Cardiovascular: Negative for chest pain. Respiratory: Negative for shortness of breath. Gastrointestinal: Negative for abdominal pain, vomiting Genitourinary: Negative for urinary compaints Musculoskeletal: Mild right leg pain with moderate swelling. Skin: Negative for skin complaints  Neurological: Negative for headache All other ROS negative  ____________________________________________   PHYSICAL EXAM:  VITAL SIGNS: ED Triage Vitals  Enc Vitals Group     BP 06/21/17 1437 (!) 158/58     Pulse Rate 06/21/17 1437 68     Resp 06/21/17 1437 18     Temp 06/21/17 1437 98.1 F (36.7 C)     Temp Source 06/21/17 1437 Oral     SpO2 06/21/17 1437 96 %     Weight 06/21/17 1436 198 lb (89.8 kg)     Height 06/21/17 1436 5\' 7"  (1.702 m)     Head Circumference --      Peak Flow --      Pain Score 06/21/17 1439 5     Pain Loc --      Pain Edu? --      Excl. in [place]? --    Constitutional: Alert and oriented. Well appearing and in no distress. Eyes: Normal exam ENT   Head: Normocephalic and atraumatic.   Mouth/Throat: Mucous membranes are moist. Cardiovascular: Normal rate, regular rhythm. No murmur Respiratory: Normal respiratory effort without tachypnea nor retractions. Breath sounds are clear Gastrointestinal:  Soft and nontender. No distention.   Musculoskeletal: Nontender with normal range of motion in all extremities. Neurologic:  Normal speech and language. No gross focal neurologic deficits  Skin:  Skin is warm, dry and intact.  Psychiatric: Mood and affect are normal.   ____________________________________________     RADIOLOGY  Negative for DVT, likely ruptured Baker's cyst  ____________________________________________   INITIAL IMPRESSION / ASSESSMENT AND PLAN / ED COURSE  Pertinent labs & imaging results that were available during my care of the patient were reviewed by me and considered in my medical decision making (see chart for details).  Patient presents to the emergency department for right leg pain and swelling.  States symptoms have been ongoing for 3 days, did recently travel to and from  [place].  Mild right calf tenderness to palpation on exam with moderate swelling.  Warm distal extremities no erythema.  Differential would include DVT, cellulitis, Baker's cyst.  Ultrasound shows no DVT but likely ruptured Baker's cyst.  Patient's labs show renal insufficiency which is baseline for the patient, anemia which is baseline for the patient.  Patient is hyperglycemic at 354.  I discussed this with the patient as well as IV hydrating and treating with insulin, patient states he did not take his insulin today because he has been here.  States he will take his evening dose when he gets home as well as his Lantus tonight.  I discussed with the patient checking his blood sugar tomorrow morning if it remains elevated he is to return to the emergency department.  Patient agreeable to plan of care.  ____________________________________________   FINAL CLINICAL IMPRESSION(S) / ED DIAGNOSES  Right leg pain swelling    Harvest Dark, MD 06/21/17 1731

## 2017-06-21 NOTE — ED Notes (Signed)
Spoke to [place] about pt, order received for [place] to r/o DVT

## 2017-06-21 NOTE — ED Triage Notes (Signed)
Pt presents to ED via [facility] from [facility] independent living c/o R lower leg swelling and pain. Pt states entire lower leg is tender to touch but pain is worst with ambulation and concentrates in R calf. Recent airplane travel back and forth to [facility]. On 81mg  ASA. Reports hx "mini strokes."

## 2017-06-21 NOTE — ED Notes (Signed)
First RN note:  Patient is here via [facility] from home c/o right leg swelling and heat behind the calf.  Patient has had recent airplane travels to [facility].  Patient normally able to ambulate well with a cane, and now is having pain with ambulation.

## 2017-06-27 ENCOUNTER — Encounter: Payer: Self-pay | Admitting: Podiatry

## 2017-06-27 ENCOUNTER — Ambulatory Visit (INDEPENDENT_AMBULATORY_CARE_PROVIDER_SITE_OTHER): Payer: Medicare Other | Admitting: Podiatry

## 2017-06-27 DIAGNOSIS — M79609 Pain in unspecified limb: Secondary | ICD-10-CM | POA: Diagnosis not present

## 2017-06-27 DIAGNOSIS — E1142 Type 2 diabetes mellitus with diabetic polyneuropathy: Secondary | ICD-10-CM

## 2017-06-27 DIAGNOSIS — B351 Tinea unguium: Secondary | ICD-10-CM | POA: Diagnosis not present

## 2017-06-27 NOTE — Progress Notes (Signed)
Complaint:  Visit Type: Patient returns to my office for continued preventative foot care services. Complaint: Patient states" my nails have grown long and thick and become painful to walk and wear shoes" Patient has been diagnosed with DM with no foot complications. The patient presents for preventative foot care services. No changes to ROS  Podiatric Exam: Vascular: dorsalis pedis and posterior tibial pulses are weakly palpable bilateral. Capillary return is immediate. Temperature gradient is WNL. Skin turgor WNL  Sensorium: Normal Semmes Weinstein monofilament test. Normal tactile sensation bilaterally. Nail Exam: Pt has thick disfigured discolored nails with subungual debris noted bilateral entire nail hallux through fifth toenails Ulcer Exam: There is no evidence of ulcer or pre-ulcerative changes or infection. Orthopedic Exam: Muscle tone and strength are WNL. No limitations in general ROM. No crepitus or effusions noted. Foot type and digits show no abnormalities. Bony prominences are unremarkable. Dorsal exostosis  B/l Skin: No Porokeratosis. No infection or ulcers  Diagnosis:  Onychomycosis, , Pain in right toe, pain in left toes  Treatment & Plan Procedures and Treatment: Consent by patient was obtained for treatment procedures. The patient understood the discussion of treatment and procedures well. All questions were answered thoroughly reviewed. Debridement of mycotic and hypertrophic toenails, 1 through 5 bilateral and clearing of subungual debris. No ulceration, no infection noted. ABN signed for 2019. Return Visit-Office Procedure: Patient instructed to return to the office for a follow up visit 3 months for continued evaluation and treatment.    Ziyad Dyar DPM 

## 2017-07-10 ENCOUNTER — Encounter: Payer: Self-pay | Admitting: Emergency Medicine

## 2017-07-10 ENCOUNTER — Emergency Department: Payer: Medicare Other

## 2017-07-10 ENCOUNTER — Emergency Department
Admission: EM | Admit: 2017-07-10 | Discharge: 2017-07-10 | Disposition: A | Discharge status: Home / Self Care | Payer: Medicare Other | Attending: Emergency Medicine | Admitting: Emergency Medicine

## 2017-07-10 ENCOUNTER — Other Ambulatory Visit: Payer: Self-pay

## 2017-07-10 DIAGNOSIS — I2510 Atherosclerotic heart disease of native coronary artery without angina pectoris: Secondary | ICD-10-CM | POA: Insufficient documentation

## 2017-07-10 DIAGNOSIS — I130 Hypertensive heart and chronic kidney disease with heart failure and stage 1 through stage 4 chronic kidney disease, or unspecified chronic kidney disease: Secondary | ICD-10-CM | POA: Diagnosis not present

## 2017-07-10 DIAGNOSIS — E1165 Type 2 diabetes mellitus with hyperglycemia: Secondary | ICD-10-CM | POA: Diagnosis not present

## 2017-07-10 DIAGNOSIS — N183 Chronic kidney disease, stage 3 (moderate): Secondary | ICD-10-CM | POA: Insufficient documentation

## 2017-07-10 DIAGNOSIS — Z79899 Other long term (current) drug therapy: Secondary | ICD-10-CM | POA: Diagnosis not present

## 2017-07-10 DIAGNOSIS — E1122 Type 2 diabetes mellitus with diabetic chronic kidney disease: Secondary | ICD-10-CM | POA: Diagnosis not present

## 2017-07-10 DIAGNOSIS — I5022 Chronic systolic (congestive) heart failure: Secondary | ICD-10-CM | POA: Diagnosis not present

## 2017-07-10 DIAGNOSIS — Z794 Long term (current) use of insulin: Secondary | ICD-10-CM | POA: Insufficient documentation

## 2017-07-10 DIAGNOSIS — R739 Hyperglycemia, unspecified: Secondary | ICD-10-CM

## 2017-07-10 DIAGNOSIS — Z8673 Personal history of transient ischemic attack (TIA), and cerebral infarction without residual deficits: Secondary | ICD-10-CM | POA: Insufficient documentation

## 2017-07-10 DIAGNOSIS — Z87891 Personal history of nicotine dependence: Secondary | ICD-10-CM | POA: Diagnosis not present

## 2017-07-10 LAB — URINALYSIS, COMPLETE (UACMP) WITH MICROSCOPIC
Bacteria, UA: NONE SEEN
Bilirubin Urine: NEGATIVE
Glucose, UA: >=500 mg/dL — AB
HGB URINE DIPSTICK: NEGATIVE
Ketones, ur: NEGATIVE mg/dL
Leukocytes, UA: NEGATIVE
NITRITE: NEGATIVE
Protein, ur: NEGATIVE mg/dL
SPECIFIC GRAVITY, URINE: 1.017 (ref 1.005–1.030)
SQUAMOUS EPITHELIAL / LPF: NONE SEEN (ref 0–5)
pH: 5 (ref 5.0–8.0)

## 2017-07-10 LAB — BASIC METABOLIC PANEL
Anion gap: 9 (ref 5–15)
BUN: 40 mg/dL — AB (ref 6–20)
CHLORIDE: 100 mmol/L — AB (ref 101–111)
CO2: 24 mmol/L (ref 22–32)
CREATININE: 1.98 mg/dL — AB (ref 0.61–1.24)
Calcium: 9.3 mg/dL (ref 8.9–10.3)
GFR calc Af Amer: 34 mL/min — ABNORMAL LOW (ref >60)
GFR calc non Af Amer: 30 mL/min — ABNORMAL LOW (ref >60)
GLUCOSE: 474 mg/dL — AB (ref 65–99)
Potassium: 5.4 mmol/L — ABNORMAL HIGH (ref 3.5–5.1)
Sodium: 133 mmol/L — ABNORMAL LOW (ref 135–145)

## 2017-07-10 LAB — GLUCOSE, CAPILLARY
GLUCOSE-CAPILLARY: 391 mg/dL — AB (ref 65–99)
GLUCOSE-CAPILLARY: 352 mg/dL — AB (ref 65–99)
Glucose-Capillary: 444 mg/dL — ABNORMAL HIGH (ref 65–99)

## 2017-07-10 LAB — CBC
HCT: 37.9 % — ABNORMAL LOW (ref 40.0–52.0)
Hemoglobin: 12 g/dL — ABNORMAL LOW (ref 13.0–18.0)
MCH: 26.8 pg (ref 26.0–34.0)
MCHC: 31.7 g/dL — AB (ref 32.0–36.0)
MCV: 84.5 fL (ref 80.0–100.0)
PLATELETS: 187 10*3/uL (ref 150–440)
RBC: 4.48 MIL/uL (ref 4.40–5.90)
RDW: 14.5 % (ref 11.5–14.5)
WBC: 12 10*3/uL — ABNORMAL HIGH (ref 3.8–10.6)

## 2017-07-10 MED ORDER — SODIUM CHLORIDE 0.9 % IV BOLUS
1000.0000 mL | Freq: Once | INTRAVENOUS | Status: AC
  Administered 2017-07-10: 1000 mL via INTRAVENOUS

## 2017-07-10 MED ORDER — INSULIN ASPART 100 UNIT/ML ~~LOC~~ SOLN
10.0000 [IU] | Freq: Once | SUBCUTANEOUS | Status: AC
  Administered 2017-07-10: 10 [IU] via INTRAVENOUS
  Filled 2017-07-10: qty 1

## 2017-07-10 NOTE — ED Notes (Signed)
RN called to update [facility] on pts plan of care. No questions reported. Pt in room by himself at this time but is in NAD and call bell is in reach. Bed locked and in lowest position.

## 2017-07-10 NOTE — ED Provider Notes (Signed)
[facility] Emergency Department Provider Note  ____________________________________________  Time seen: Approximately 4:42 PM  I have reviewed the triage vital signs and the nursing notes.   HISTORY  Chief Complaint Hyperglycemia   HPI AEDAN GEIMER is a 84 y.o. male with a history of CHF, chronic kidney disease, diabetes, hypertension, CVA who presents for evaluation of hyperglycemia.  Patient saw his primary care doctor few days ago for a cough.  He was put on prednisone.  Today he was found to have elevated blood glucose.  Patient reports that his sugar has been elevated since starting the prednisone however no adjustments have been made to his insulin at the nursing home.  Patient denies chest pain, fever, shortness of breath, nausea, vomiting, diarrhea, abdominal pain, dysuria.  Chief Complaint: hyperglycemia Severity: moderate Duration: few days Context: in the setting of being placed on prednisone Modifying factors: no adjustment of insulin dose Associated signs/symptoms: no fever, dizziness, SOB, CP  Past Medical History:  Diagnosis Date  . Chronic systolic CHF (congestive heart failure) ([place])   . CKD (chronic kidney disease), stage III ([place])   . Diabetes ([place])   . HBP (high blood pressure)   . Heart attack ([place])   . High cholesterol   . Stroke (cerebrum) ([place])   . Swelling     Patient Active Problem List   Diagnosis Date Noted  . Carotid stenosis 01/31/2016  . Hematochezia   . Diverticulosis of large intestine without hemorrhage   . Hemorrhoid   . GIB (gastrointestinal bleeding) 10/18/2015  . Blood loss anemia 10/18/2015  . CKD (chronic kidney disease), stage III ([place])   . Chronic systolic CHF (congestive heart failure) ([place])   . Stroke (cerebrum) ([place])   . Absolute anemia 06/10/2015  . Cataract cortical, senile 06/10/2015  . Controlled type 2 diabetes mellitus without complication ([place]) 13/08/6576  . Acid reflux 06/10/2015  .  Combined fat and carbohydrate induced hyperlipemia 06/10/2015  . Cerebrovascular accident (CVA) ([place]) 06/10/2015  . Non-pressure chronic ulcer of skin of other sites with unspecified severity ([place]) 06/10/2015  . Benign essential HTN 08/25/2014  . Arteriosclerosis of coronary artery 09/09/2013  . Microalbuminuria 09/08/2013  . Diabetes mellitus ([place]) 09/08/2013    Past Surgical History:  Procedure Laterality Date  . COLONOSCOPY N/A 10/21/2015   Procedure: COLONOSCOPY;  Surgeon: Milus Banister, MD;  Location[facility];  Service: Endoscopy;  Laterality: N/A;  . EXPLORATION POST OPERATIVE OPEN HEART    . EYE SURGERY Bilateral     Prior to Admission medications   Medication Sig Start Date End Date Taking? Authorizing Provider  atorvastatin (LIPITOR) 40 MG tablet Take 40 mg by mouth daily.   Yes [provider]  blood glucose meter kit and supplies KIT Dispense based on patient and insurance preference. Use up to four times daily as directed. (FOR ICD-9 250.00, 250.01). 06/21/17  Yes Paduchowski, Lennette Bihari, MD  doxycycline (VIBRA-TABS) 100 MG tablet Take 100 mg by mouth 2 (two) times daily. 07/09/17 07/20/17 Yes [provider]  Elastic Bandages & Supports ([place]) MISC Please provide 63mHg compression stockings 06/21/17  Yes Paduchowski, KLennette Bihari MD  ferrous sulfate 325 (65 FE) MG tablet Take 325 mg by mouth 3 (three) times daily.    Yes [provider]  fluticasone (FLONASE) 50 MCG/ACT nasal spray Place 1 spray into both nostrils daily as needed for allergies.  06/03/13  Yes [provider]  furosemide (LASIX) 40 MG tablet Take 40 mg by mouth  2 (two) times daily.    Yes [provider]  insulin aspart (NOVOLOG) 100 UNIT/ML FlexPen Inject 8u under the skin at breakfast, 12u at lunchtime and 16u at dinner time - max daily dose: 50u   Yes [provider]  Insulin Glargine (LANTUS SOLOSTAR) 100 UNIT/ML Solostar Pen Inject 26  Units into the skin at bedtime.  05/03/15  Yes [provider]  lisinopril (PRINIVIL,ZESTRIL) 5 MG tablet Take 5 mg by mouth daily. 10/13/15  Yes [provider]  metoprolol (LOPRESSOR) 50 MG tablet Take 50 mg by mouth 2 (two) times daily.    Yes [provider]  nitroGLYCERIN (NITROSTAT) 0.4 MG SL tablet take 1 tablet under the tongue every 5 minutes if needed for chest pain 10/29/14  Yes [provider]  omeprazole (PRILOSEC) 40 MG capsule Take 40 mg by mouth 2 (two) times daily.    Yes [provider]  RA ASPIRIN EC ADULT LOW ST 81 MG EC tablet Take 1 tablet (81 mg total) by mouth daily. 11/02/15  Yes Buriev, Arie Sabina, MD  Cyanocobalamin (RA VITAMIN B-12 TR) 1000 MCG TBCR Take 1 tablet by mouth daily.     [provider]    Allergies Neuromuscular blocking agents; Other; and Amlodipine  Family History  Problem Relation Age of Onset  . Diabetes Mellitus I Mother   . Alcoholism Father   . Heart attack Sister   . Prostate cancer Brother     Social History Social History   Tobacco Use  . Smoking status: Former Smoker    Types: Cigarettes    Last attempt to quit: 01/30/1995    Years since quitting: 22.4  . Smokeless tobacco: Never Used  Substance Use Topics  . Alcohol use: No  . Drug use: No    Review of Systems  Constitutional: Negative for fever. Eyes: Negative for visual changes. ENT: Negative for sore throat. Neck: No neck pain  Cardiovascular: Negative for chest pain. Respiratory: Negative for shortness of breath. + cough Gastrointestinal: Negative for abdominal pain, vomiting or diarrhea. Genitourinary: Negative for dysuria. Musculoskeletal: Negative for back pain. Skin: Negative for rash. Neurological: Negative for headaches, weakness or numbness. Psych: No SI or HI  ____________________________________________   PHYSICAL EXAM:  VITAL SIGNS: ED Triage Vitals  Enc Vitals Group     BP 07/10/17 1444 (!) 188/75       Pulse Rate 07/10/17 1444 70     Resp 07/10/17 1444 18     Temp 07/10/17 1444 97.8 F (36.6 C)     Temp Source 07/10/17 1444 Oral     SpO2 07/10/17 1444 96 %     Weight 07/10/17 1449 185 lb (83.9 kg)     Height 07/10/17 1449 5' 7"  (1.702 m)     Head Circumference --      Peak Flow --      Pain Score 07/10/17 1449 0     Pain Loc --      Pain Edu? --      Excl. in [place]? --     Constitutional: Alert and oriented. Well appearing and in no apparent distress. HEENT:      Head: Normocephalic and atraumatic.         Eyes: Conjunctivae are normal. Sclera is non-icteric.       Mouth/Throat: Mucous membranes are moist.       Neck: Supple with no signs of meningismus. Cardiovascular: Regular rate and rhythm. No murmurs, gallops, or rubs. 2+ symmetrical distal  pulses are present in all extremities. No JVD. Respiratory: Normal respiratory effort. Lungs are clear to auscultation bilaterally. No wheezes, crackles, or rhonchi.  Gastrointestinal: Soft, non tender, and non distended with positive bowel sounds. No rebound or guarding. Musculoskeletal: Nontender with normal range of motion in all extremities. No edema, cyanosis, or erythema of extremities. Neurologic: Normal speech and language. Face is symmetric. Moving all extremities. No gross focal neurologic deficits are appreciated. Skin: Skin is warm, dry and intact. No rash noted. Psychiatric: Mood and affect are normal. Speech and behavior are normal.  ____________________________________________   LABS (all labs ordered are listed, but only abnormal results are displayed)  Labs Reviewed  GLUCOSE, CAPILLARY - Abnormal; Notable for the following components:      Result Value   Glucose-Capillary 444 (*)    All other components within normal limits  BASIC METABOLIC PANEL - Abnormal; Notable for the following components:   Sodium 133 (*)    Potassium 5.4 (*)    Chloride 100 (*)    Glucose, Bld 474 (*)    BUN 40 (*)    Creatinine, Ser  1.98 (*)    GFR calc non Af Amer 30 (*)    GFR calc Af Amer 34 (*)    All other components within normal limits  BLOOD GAS, VENOUS - Abnormal; Notable for the following components:   pCO2, Ven 61 (*)    All other components within normal limits  CBC - Abnormal; Notable for the following components:   WBC 12.0 (*)    Hemoglobin 12.0 (*)    HCT 37.9 (*)    MCHC 31.7 (*)    All other components within normal limits  URINALYSIS, COMPLETE (UACMP) WITH MICROSCOPIC - Abnormal; Notable for the following components:   Color, Urine STRAW (*)    APPearance CLEAR (*)    Glucose, UA >=500 (*)    All other components within normal limits  GLUCOSE, CAPILLARY - Abnormal; Notable for the following components:   Glucose-Capillary 391 (*)    All other components within normal limits  GLUCOSE, CAPILLARY - Abnormal; Notable for the following components:   Glucose-Capillary 352 (*)    All other components within normal limits   ____________________________________________  EKG  none  ____________________________________________  RADIOLOGY  I have personally reviewed the images performed during this visit and I agree with the Radiologist's read.   Interpretation by Radiologist:  Dg Chest 2 View  Result Date: 07/10/2017 CLINICAL DATA:  Persistent cough. EXAM: CHEST - 2 VIEW COMPARISON:  Chest x-ray dated July 11, 2012. FINDINGS: The heart size and mediastinal contours are within normal limits. Prior CABG and aortic valve replacement. Normal pulmonary vascularity. No focal consolidation, pleural effusion, or pneumothorax. No acute osseous abnormality. IMPRESSION: No active cardiopulmonary disease. Electronically Signed   By: Titus Dubin M.D.   On: 07/10/2017 15:39     ____________________________________________   PROCEDURES  Procedure(s) performed: None Procedures Critical Care performed:  None ____________________________________________   INITIAL IMPRESSION / ASSESSMENT AND PLAN /  ED COURSE  84 y.o. male with a history of CHF, chronic kidney disease, diabetes, hypertension, CVA who presents for evaluation of hyperglycemia after being started on prednisone for cough.  Patient is actively coughing but has no hypoxia, normal work of breathing, no chest pain or shortness of breath.  Chest x-ray does not show any evidence of pneumonia or pulmonary edema.  Labs showing glucose of 474 with normal bicarb, known anion gap, and normal VBG.  No evidence of  DKA.  Patient will be given fluids and insulin.  We will change his insulin regimen to sliding scale while patient is on prednisone.    _________________________ 8:22 PM on 07/10/2017 -----------------------------------------  Blood glucose trending down after fluids and insulin.  Discussed with patient either stopping prednisone or adjusting his insulin dose based on his sliding scale until prednisone course is done.  Recommend close follow-up with primary care doctor.  Discussed return precautions.   As part of my medical decision making, I reviewed the following data within the [facility] notes reviewed and incorporated, Labs reviewed , EKG interpreted , Old chart reviewed, Notes from prior ED visits and [place] Controlled Substance Database    Pertinent labs & imaging results that were available during my care of the patient were reviewed by me and considered in my medical decision making (see chart for details).    ____________________________________________   FINAL CLINICAL IMPRESSION(S) / ED DIAGNOSES  Final diagnoses:  Hyperglycemia      NEW MEDICATIONS STARTED DURING THIS VISIT:  ED Discharge Orders    None       Note:  This document was prepared using Dragon voice recognition software and may include unintentional dictation errors.    Alfred Levins, [place], MD 07/10/17 2022

## 2017-07-10 NOTE — Discharge Instructions (Addendum)
Your sugar was elevated due to the prednisone that you have been taking at home.  You have 2 options.  You can either stop the prednisone or you can increase your insulin at home based on your sliding scale until your prednisone course is done to prevent your sugars from becoming uncontrolled.  If you feel that the prednisone is helping with your cough you may continue to take it.  Your chest x-ray does not show any evidence of pneumonia.  Follow-up with your primary care doctor in 1 to 2 days.

## 2017-07-10 NOTE — ED Triage Notes (Signed)
Pt arrives via [facility] from [facility] with persistent cough "for over 5 days." Was placed on prednisone for the cough with minimal improvement. Has experienced elevated blood sugar ever since. Denies fever, chills and chest pain.   Pt has hx DM and does take insulin injections.

## 2017-07-10 NOTE — ED Notes (Signed)
ED Provider at bedside. 

## 2017-07-13 LAB — BLOOD GAS, VENOUS
Acid-base deficit: 1.5 mmol/L (ref 0.0–2.0)
Bicarbonate: 26.8 mmol/L (ref 20.0–28.0)
PATIENT TEMPERATURE: 37
pCO2, Ven: 61 mmHg — ABNORMAL HIGH (ref 44.0–60.0)
pH, Ven: 7.25 (ref 7.250–7.430)

## 2017-08-15 ENCOUNTER — Observation Stay
Admission: EM | Admit: 2017-08-15 | Discharge: 2017-08-16 | Disposition: A | Discharge status: Home / Self Care | Payer: Medicare Other | Attending: Internal Medicine | Admitting: Internal Medicine

## 2017-08-15 ENCOUNTER — Observation Stay: Payer: Medicare Other

## 2017-08-15 ENCOUNTER — Observation Stay
Admit: 2017-08-15 | Discharge: 2017-08-15 | Disposition: A | Discharge status: Home / Self Care | Payer: Medicare Other | Attending: Internal Medicine | Admitting: Internal Medicine

## 2017-08-15 ENCOUNTER — Emergency Department: Payer: Medicare Other

## 2017-08-15 ENCOUNTER — Other Ambulatory Visit: Payer: Self-pay

## 2017-08-15 ENCOUNTER — Observation Stay: Admit: 2017-08-15 | Payer: Medicare Other

## 2017-08-15 ENCOUNTER — Encounter: Payer: Self-pay | Admitting: Emergency Medicine

## 2017-08-15 DIAGNOSIS — Z7982 Long term (current) use of aspirin: Secondary | ICD-10-CM | POA: Insufficient documentation

## 2017-08-15 DIAGNOSIS — J320 Chronic maxillary sinusitis: Secondary | ICD-10-CM | POA: Insufficient documentation

## 2017-08-15 DIAGNOSIS — E1151 Type 2 diabetes mellitus with diabetic peripheral angiopathy without gangrene: Secondary | ICD-10-CM | POA: Diagnosis not present

## 2017-08-15 DIAGNOSIS — Z794 Long term (current) use of insulin: Secondary | ICD-10-CM | POA: Diagnosis not present

## 2017-08-15 DIAGNOSIS — I2510 Atherosclerotic heart disease of native coronary artery without angina pectoris: Secondary | ICD-10-CM | POA: Insufficient documentation

## 2017-08-15 DIAGNOSIS — Z87891 Personal history of nicotine dependence: Secondary | ICD-10-CM | POA: Insufficient documentation

## 2017-08-15 DIAGNOSIS — N183 Chronic kidney disease, stage 3 (moderate): Secondary | ICD-10-CM | POA: Insufficient documentation

## 2017-08-15 DIAGNOSIS — E785 Hyperlipidemia, unspecified: Secondary | ICD-10-CM | POA: Insufficient documentation

## 2017-08-15 DIAGNOSIS — I130 Hypertensive heart and chronic kidney disease with heart failure and stage 1 through stage 4 chronic kidney disease, or unspecified chronic kidney disease: Secondary | ICD-10-CM | POA: Insufficient documentation

## 2017-08-15 DIAGNOSIS — Z888 Allergy status to other drugs, medicaments and biological substances status: Secondary | ICD-10-CM | POA: Diagnosis not present

## 2017-08-15 DIAGNOSIS — I252 Old myocardial infarction: Secondary | ICD-10-CM | POA: Insufficient documentation

## 2017-08-15 DIAGNOSIS — Z833 Family history of diabetes mellitus: Secondary | ICD-10-CM | POA: Insufficient documentation

## 2017-08-15 DIAGNOSIS — Z8249 Family history of ischemic heart disease and other diseases of the circulatory system: Secondary | ICD-10-CM | POA: Insufficient documentation

## 2017-08-15 DIAGNOSIS — Z841 Family history of disorders of kidney and ureter: Secondary | ICD-10-CM | POA: Diagnosis not present

## 2017-08-15 DIAGNOSIS — I5022 Chronic systolic (congestive) heart failure: Secondary | ICD-10-CM | POA: Diagnosis not present

## 2017-08-15 DIAGNOSIS — E7800 Pure hypercholesterolemia, unspecified: Secondary | ICD-10-CM | POA: Insufficient documentation

## 2017-08-15 DIAGNOSIS — Z7902 Long term (current) use of antithrombotics/antiplatelets: Secondary | ICD-10-CM | POA: Insufficient documentation

## 2017-08-15 DIAGNOSIS — H538 Other visual disturbances: Secondary | ICD-10-CM | POA: Diagnosis not present

## 2017-08-15 DIAGNOSIS — H499 Unspecified paralytic strabismus: Secondary | ICD-10-CM | POA: Insufficient documentation

## 2017-08-15 DIAGNOSIS — H510 Palsy (spasm) of conjugate gaze: Secondary | ICD-10-CM

## 2017-08-15 DIAGNOSIS — H5347 Heteronymous bilateral field defects: Secondary | ICD-10-CM | POA: Diagnosis present

## 2017-08-15 DIAGNOSIS — Z8673 Personal history of transient ischemic attack (TIA), and cerebral infarction without residual deficits: Secondary | ICD-10-CM | POA: Insufficient documentation

## 2017-08-15 DIAGNOSIS — E1122 Type 2 diabetes mellitus with diabetic chronic kidney disease: Secondary | ICD-10-CM | POA: Insufficient documentation

## 2017-08-15 DIAGNOSIS — I639 Cerebral infarction, unspecified: Secondary | ICD-10-CM | POA: Diagnosis present

## 2017-08-15 DIAGNOSIS — Z811 Family history of alcohol abuse and dependence: Secondary | ICD-10-CM | POA: Insufficient documentation

## 2017-08-15 DIAGNOSIS — H532 Diplopia: Principal | ICD-10-CM | POA: Insufficient documentation

## 2017-08-15 DIAGNOSIS — I081 Rheumatic disorders of both mitral and tricuspid valves: Secondary | ICD-10-CM | POA: Insufficient documentation

## 2017-08-15 DIAGNOSIS — Z8042 Family history of malignant neoplasm of prostate: Secondary | ICD-10-CM | POA: Diagnosis not present

## 2017-08-15 DIAGNOSIS — Z79899 Other long term (current) drug therapy: Secondary | ICD-10-CM | POA: Insufficient documentation

## 2017-08-15 LAB — URINALYSIS, COMPLETE (UACMP) WITH MICROSCOPIC
Bacteria, UA: NONE SEEN
Bilirubin Urine: NEGATIVE
GLUCOSE, UA: NEGATIVE mg/dL
HGB URINE DIPSTICK: NEGATIVE
Ketones, ur: NEGATIVE mg/dL
Leukocytes, UA: NEGATIVE
NITRITE: NEGATIVE
PH: 7 (ref 5.0–8.0)
Protein, ur: NEGATIVE mg/dL
SPECIFIC GRAVITY, URINE: 1.006 (ref 1.005–1.030)

## 2017-08-15 LAB — HEMOGLOBIN A1C
Hgb A1c MFr Bld: 8 % — ABNORMAL HIGH (ref 4.8–5.6)
Mean Plasma Glucose: 182.9 mg/dL

## 2017-08-15 LAB — COMPREHENSIVE METABOLIC PANEL
ALT: 14 U/L (ref 0–44)
AST: 19 U/L (ref 15–41)
Albumin: 3.6 g/dL (ref 3.5–5.0)
Alkaline Phosphatase: 74 U/L (ref 38–126)
Anion gap: 4 — ABNORMAL LOW (ref 5–15)
BILIRUBIN TOTAL: 0.6 mg/dL (ref 0.3–1.2)
BUN: 28 mg/dL — ABNORMAL HIGH (ref 8–23)
CO2: 30 mmol/L (ref 22–32)
CREATININE: 1.74 mg/dL — AB (ref 0.61–1.24)
Calcium: 9.1 mg/dL (ref 8.9–10.3)
Chloride: 107 mmol/L (ref 98–111)
GFR, EST AFRICAN AMERICAN: 40 mL/min — AB (ref >60)
GFR, EST NON AFRICAN AMERICAN: 35 mL/min — AB (ref >60)
Glucose, Bld: 149 mg/dL — ABNORMAL HIGH (ref 70–99)
Potassium: 4.3 mmol/L (ref 3.5–5.1)
Sodium: 141 mmol/L (ref 135–145)
TOTAL PROTEIN: 7.4 g/dL (ref 6.5–8.1)

## 2017-08-15 LAB — CBC
HEMATOCRIT: 34.8 % — AB (ref 40.0–52.0)
Hemoglobin: 11.2 g/dL — ABNORMAL LOW (ref 13.0–18.0)
MCH: 27.1 pg (ref 26.0–34.0)
MCHC: 32.2 g/dL (ref 32.0–36.0)
MCV: 84.1 fL (ref 80.0–100.0)
Platelets: 146 10*3/uL — ABNORMAL LOW (ref 150–440)
RBC: 4.14 MIL/uL — ABNORMAL LOW (ref 4.40–5.90)
RDW: 14.8 % — AB (ref 11.5–14.5)
WBC: 7.8 10*3/uL (ref 3.8–10.6)

## 2017-08-15 LAB — GLUCOSE, CAPILLARY
GLUCOSE-CAPILLARY: 213 mg/dL — AB (ref 70–99)
Glucose-Capillary: 131 mg/dL — ABNORMAL HIGH (ref 70–99)
Glucose-Capillary: 246 mg/dL — ABNORMAL HIGH (ref 70–99)

## 2017-08-15 LAB — TROPONIN I: TROPONIN I: 0.03 ng/mL — AB (ref <0.03)

## 2017-08-15 MED ORDER — CLOPIDOGREL BISULFATE 75 MG PO TABS
75.0000 mg | ORAL_TABLET | Freq: Every day | ORAL | Status: DC
  Administered 2017-08-15 – 2017-08-16 (×2): 75 mg via ORAL
  Filled 2017-08-15 (×2): qty 1

## 2017-08-15 MED ORDER — INSULIN GLARGINE 100 UNIT/ML SOLOSTAR PEN: 18.0000 [IU] | PEN_INJECTOR | Freq: Every day | SUBCUTANEOUS | Status: DC

## 2017-08-15 MED ORDER — PANTOPRAZOLE SODIUM 40 MG PO TBEC
40.0000 mg | DELAYED_RELEASE_TABLET | Freq: Every day | ORAL | Status: DC
  Administered 2017-08-15 – 2017-08-16 (×2): 40 mg via ORAL
  Filled 2017-08-15 (×2): qty 1

## 2017-08-15 MED ORDER — ASPIRIN 81 MG PO CHEW
81.0000 mg | CHEWABLE_TABLET | Freq: Every day | ORAL | Status: DC
  Administered 2017-08-16: 81 mg via ORAL
  Filled 2017-08-15: qty 1

## 2017-08-15 MED ORDER — ASPIRIN 81 MG PO CHEW
324.0000 mg | CHEWABLE_TABLET | Freq: Once | ORAL | Status: AC
  Administered 2017-08-15: 324 mg via ORAL
  Filled 2017-08-15: qty 4

## 2017-08-15 MED ORDER — HYDRALAZINE HCL 20 MG/ML IJ SOLN: 5.0000 mg | INTRAMUSCULAR | Status: DC | PRN

## 2017-08-15 MED ORDER — FLUTICASONE PROPIONATE 50 MCG/ACT NA SUSP
1.0000 | Freq: Every day | NASAL | Status: DC | PRN
  Filled 2017-08-15: qty 16

## 2017-08-15 MED ORDER — ENOXAPARIN SODIUM 40 MG/0.4ML ~~LOC~~ SOLN
30.0000 mg | SUBCUTANEOUS | Status: DC
  Filled 2017-08-15: qty 0.4

## 2017-08-15 MED ORDER — FERROUS SULFATE 325 (65 FE) MG PO TABS
325.0000 mg | ORAL_TABLET | Freq: Three times a day (TID) | ORAL | Status: DC
  Administered 2017-08-15 – 2017-08-16 (×3): 325 mg via ORAL
  Filled 2017-08-15 (×3): qty 1

## 2017-08-15 MED ORDER — ATORVASTATIN CALCIUM 20 MG PO TABS
40.0000 mg | ORAL_TABLET | Freq: Every day | ORAL | Status: DC
  Administered 2017-08-15: 18:00:00 40 mg via ORAL
  Filled 2017-08-15: qty 2

## 2017-08-15 MED ORDER — INSULIN GLARGINE 100 UNIT/ML ~~LOC~~ SOLN
18.0000 [IU] | Freq: Every day | SUBCUTANEOUS | Status: DC
  Administered 2017-08-15: 18 [IU] via SUBCUTANEOUS
  Filled 2017-08-15 (×2): qty 0.18

## 2017-08-15 MED ORDER — VITAMIN B-12 1000 MCG PO TABS
1000.0000 ug | ORAL_TABLET | Freq: Every day | ORAL | Status: DC
  Administered 2017-08-16: 09:00:00 1000 ug via ORAL
  Filled 2017-08-15: qty 1

## 2017-08-15 MED ORDER — ACETAMINOPHEN 650 MG RE SUPP: 650.0000 mg | Freq: Four times a day (QID) | RECTAL | Status: DC | PRN

## 2017-08-15 MED ORDER — METOPROLOL TARTRATE 50 MG PO TABS
50.0000 mg | ORAL_TABLET | Freq: Two times a day (BID) | ORAL | Status: DC
Start: 2017-08-15 — End: 2017-08-16
  Administered 2017-08-15 – 2017-08-16 (×2): 50 mg via ORAL
  Filled 2017-08-15 (×2): qty 1

## 2017-08-15 MED ORDER — INSULIN ASPART 100 UNIT/ML ~~LOC~~ SOLN
0.0000 [IU] | Freq: Three times a day (TID) | SUBCUTANEOUS | Status: DC
  Administered 2017-08-16: 1 [IU] via SUBCUTANEOUS
  Administered 2017-08-16: 2 [IU] via SUBCUTANEOUS
  Filled 2017-08-15 (×2): qty 1

## 2017-08-15 MED ORDER — ACETAMINOPHEN 325 MG PO TABS: 650.0000 mg | ORAL_TABLET | Freq: Four times a day (QID) | ORAL | Status: DC | PRN

## 2017-08-15 MED ORDER — SODIUM CHLORIDE 0.9 % IV BOLUS
1000.0000 mL | Freq: Once | INTRAVENOUS | Status: AC
  Administered 2017-08-15: 1000 mL via INTRAVENOUS

## 2017-08-15 MED ORDER — INSULIN ASPART 100 UNIT/ML ~~LOC~~ SOLN
0.0000 [IU] | Freq: Every day | SUBCUTANEOUS | Status: DC
  Administered 2017-08-15: 2 [IU] via SUBCUTANEOUS
  Filled 2017-08-15: qty 1

## 2017-08-15 MED ORDER — ENOXAPARIN SODIUM 30 MG/0.3ML ~~LOC~~ SOLN
30.0000 mg | SUBCUTANEOUS | Status: DC
Start: 2017-08-15 — End: 2017-08-16
  Administered 2017-08-16: 30 mg via SUBCUTANEOUS
  Filled 2017-08-15: qty 0.3

## 2017-08-15 NOTE — ED Notes (Signed)
Pt in MRI at this time. Per MRI, they will inform this RN when pt is done so we can transport him to the floor from scan.

## 2017-08-15 NOTE — ED Notes (Signed)
Pt on phone with nurse from [facility] at this time, who is trying to contact his sister.

## 2017-08-15 NOTE — H&P (Addendum)
[place] [facility] [place] NAME: Ryan Mcclure    MR#:  [id number]  DATE OF BIRTH:  1931/04/04  DATE OF ADMISSION:  08/15/2017  PRIMARY CARE PHYSICIAN: Maryland Pink, MD   REQUESTING/REFERRING PHYSICIAN: Dr Aundria Rud  CHIEF COMPLAINT:   Chief Complaint  Patient presents with  . Weakness    HISTORY OF PRESENT ILLNESS:  Ryan Mcclure  is a 84 y.o. male presents with a few days of blurred vision.  He states he is seeing double with one image on top of the other.  He states if he covers one eye he can see okay if he covers the other eye he can see okay.  He is disoriented with walking   Because of his vision.  He does not feel weak one side versus the other.  Hospitalist services were contacted for admission for stroke work-up.  PAST MEDICAL HISTORY:   Past Medical History:  Diagnosis Date  . Chronic systolic CHF (congestive heart failure) ([place])   . CKD (chronic kidney disease), stage III ([place])   . Diabetes ([place])   . HBP (high blood pressure)   . Heart attack ([place])   . High cholesterol   . Stroke (cerebrum) ([place])   . Swelling     PAST SURGICAL HISTORY:   Past Surgical History:  Procedure Laterality Date  . COLONOSCOPY N/A 10/21/2015   Procedure: COLONOSCOPY;  Surgeon: Milus Banister, MD;  Location: [facility] ENDOSCOPY;  Service: Endoscopy;  Laterality: N/A;  . EXPLORATION POST OPERATIVE OPEN HEART    . EYE SURGERY Bilateral     SOCIAL HISTORY:   Social History   Tobacco Use  . Smoking status: Former Smoker    Types: Cigarettes    Last attempt to quit: 01/30/1995    Years since quitting: 22.5  . Smokeless tobacco: Never Used  Substance Use Topics  . Alcohol use: No    FAMILY HISTORY:   Family History  Problem Relation Age of Onset  . Diabetes Mellitus I Mother   . Kidney failure Mother   . Alcoholism Father   . Heart attack Sister   . Prostate cancer Brother     DRUG ALLERGIES:   Allergies  Allergen  Reactions  . Neuromuscular Blocking Agents Nausea And Vomiting and Other (See Comments)    Tongue swelling, lips swelling   . Other Other (See Comments)    Tongue/lips swelling with steroid dose pack, pt does not recall exact name.   . Amlodipine Nausea And Vomiting    REVIEW OF SYSTEMS:  CONSTITUTIONAL: No fever, fatigue or weakness.  Sometimes sweats with low sugar. EYES: Positive for double vision one on top of the other image when looking with both eyes.  If he covers one eye or the other eye he can see clearly in 1 image.  Wears glasses. EARS, NOSE, AND THROAT: No tinnitus or ear pain. No sore throat.  Positive for runny nose RESPIRATORY: No cough, shortness of breath, wheezing or hemoptysis.  CARDIOVASCULAR: No chest pain, orthopnea, edema.  GASTROINTESTINAL: No nausea, vomiting, diarrhea or abdominal pain.  occasional blood in the bowel movements. GENITOURINARY: No dysuria, hematuria.  ENDOCRINE: No polyuria, nocturia,  HEMATOLOGY: No anemia, easy bruising or bleeding SKIN: No rash or lesion. MUSCULOSKELETAL: No joint pain or arthritis.   NEUROLOGIC: No tingling, numbness, weakness.  PSYCHIATRY: No anxiety or depression.   MEDICATIONS AT HOME:   Prior to Admission medications   Medication Sig Start Date End Date Taking? Authorizing  Provider  atorvastatin (LIPITOR) 40 MG tablet Take 40 mg by mouth daily.   Yes [provider]  blood glucose meter kit and supplies KIT Dispense based on patient and insurance preference. Use up to four times daily as directed. (FOR ICD-9 250.00, 250.01). 06/21/17  Yes Harvest Dark, MD  Cyanocobalamin (RA VITAMIN B-12 TR) 1000 MCG TBCR Take 1 tablet by mouth daily.    Yes [provider]  Elastic Bandages & Supports (MEDICAL COMPRESSION STOCKINGS) MISC Please provide 40mHg compression stockings 06/21/17  Yes Paduchowski, KLennette Bihari MD  ferrous sulfate 325 (65 FE) MG tablet Take 325 mg by mouth 3 (three) times daily.    Yes  [provider]  fluticasone (FLONASE) 50 MCG/ACT nasal spray Place 1 spray into both nostrils daily as needed for allergies.  06/03/13  Yes [provider]  furosemide (LASIX) 40 MG tablet Take 40 mg by mouth 2 (two) times daily.    Yes [provider]  insulin aspart (NOVOLOG) 100 UNIT/ML FlexPen Inject 8u under the skin at breakfast, 12u at lunchtime and 16u at dinner time - max daily dose: 50u   Yes [provider]  Insulin Glargine (LANTUS SOLOSTAR) 100 UNIT/ML Solostar Pen Inject 26 Units into the skin at bedtime.  05/03/15  Yes [provider]  lisinopril (PRINIVIL,ZESTRIL) 5 MG tablet Take 5 mg by mouth daily. 10/13/15  Yes [provider]  metFORMIN (GLUCOPHAGE) 500 MG tablet Take 500 mg by mouth 2 (two) times daily. 07/23/17  Yes [provider]  metoprolol (LOPRESSOR) 50 MG tablet Take 50 mg by mouth 2 (two) times daily.    Yes [provider]  nitroGLYCERIN (NITROSTAT) 0.4 MG SL tablet take 1 tablet under the tongue every 5 minutes if needed for chest pain 10/29/14  Yes [provider]  omeprazole (PRILOSEC) 40 MG capsule Take 40 mg by mouth 2 (two) times daily.    Yes [provider]  RA ASPIRIN EC ADULT LOW ST 81 MG EC tablet Take 1 tablet (81 mg total) by mouth daily. 11/02/15  Yes Buriev, UArie Sabina MD      VITAL SIGNS:  Blood pressure (!) 185/89, pulse 69, temperature 98.1 F (36.7 C), resp. rate 18, height 5' 7"  (1.702 m), weight 83.9 kg (185 lb), SpO2 98 %.  PHYSICAL EXAMINATION:  GENERAL:  84y.o.-year-old patient lying in the bed with no acute distress.  EYES: Pupils equal, round, reactive to light and accommodation. No scleral icterus.  Patient's left eye is deviated out a little bit.  Unable to move left eye past the midline.  Patient states that his vision is not double when he covers one eye versus the other.  Peripheral vision both eyes a little impaired. HEENT: Head atraumatic,  normocephalic. Oropharynx and nasopharynx clear.  NECK:  Supple, no jugular venous distention. No thyroid enlargement, no tenderness.  LUNGS: Normal breath sounds bilaterally, no wheezing, rales,rhonchi or crepitation. No use of accessory muscles of respiration.  CARDIOVASCULAR: S1, S2 normal. No murmurs, rubs, or gallops.  ABDOMEN: Soft, nontender, nondistended. Bowel sounds present. No organomegaly or mass.  EXTREMITIES: 2+ pedal edema.  No Cyanosis, or clubbing.  NEUROLOGIC: Patient unable to move left eye past the midline medially. Muscle strength 5/5 in all extremities. Sensation intact. Gait not checked.  PSYCHIATRIC: The patient is alert and oriented x 3.  SKIN: No rash, lesion, or ulcer.   LABORATORY PANEL:   CBC Recent Labs  Lab 08/15/17 0948  WBC 7.8  HGB 11.2*  HCT 34.8*  PLT 146*   ------------------------------------------------------------------------------------------------------------------  Chemistries  Recent Labs  Lab 08/15/17 0948  NA 141  K 4.3  CL 107  CO2 30  GLUCOSE 149*  BUN 28*  CREATININE 1.74*  CALCIUM 9.1  AST 19  ALT 14  ALKPHOS 74  BILITOT 0.6   ------------------------------------------------------------------------------------------------------------------  Cardiac Enzymes Recent Labs  Lab 08/15/17 0948  TROPONINI 0.03*   ------------------------------------------------------------------------------------------------------------------  RADIOLOGY:  Ct Head Wo Contrast  Result Date: 08/15/2017 CLINICAL DATA:  Dizziness and lethargy.  Diplopia. EXAM: CT HEAD WITHOUT CONTRAST TECHNIQUE: Contiguous axial images were obtained from the base of the skull through the vertex without intravenous contrast. COMPARISON:  February 21, 2011 FINDINGS: Brain: Moderate diffuse atrophy is stable. There is no intracranial mass, hemorrhage, extra-axial fluid collection, or midline shift. There is evidence of a prior infarct in the medial right occipital  lobe, stable. There is patchy small vessel disease throughout the centra semiovale bilaterally, stable. A punctate calcification in the left parietal lobe is stable and probably represents a small granuloma. No new gray-white compartment lesions are evident. No evident acute infarct. Vascular: There is no appreciable hyperdense vessel. There is calcification in each carotid siphon region. Skull: Bony calvarium appears intact. Sinuses/Orbits: There is opacification of the visualized superior left maxillary antrum. There is mucosal thickening in several ethmoid air cells bilaterally. Other visualized paranasal sinuses are clear. Note that frontal sinuses are hypoplastic. Visualized orbits appear symmetric bilaterally. Note that patient has had cataract removal bilaterally. Other: Visualized mastoid air cells are clear. IMPRESSION: 1. Stable atrophy with periventricular small vessel disease. Prior infarct medial right occipital lobe. Probable small granuloma, calcified, left parietal lobe, stable. No acute infarct evident. No mass or hemorrhage. 2.  Foci of arterial vascular calcification. 3.  Foci of paranasal sinus disease. Electronically Signed   By: Lowella Grip III M.D.   On: 08/15/2017 10:43   Mr Brain Wo Contrast  Result Date: 08/15/2017 CLINICAL DATA:  History of CVA presenting with diplopia. History of hypertension, diabetes, and chronic kidney disease. EXAM: MRI HEAD WITHOUT CONTRAST TECHNIQUE: Multiplanar, multiecho pulse sequences of the brain and surrounding structures were obtained without intravenous contrast. COMPARISON:  CT head 08/15/2017.  MR head 11/06/2009. FINDINGS: Brain: Small focus of restricted diffusion, corresponding low ADC, periaqueductal midbrain, LEFT paramedian location, could involve third nerve nucleus or medial longitudinal fasciculus, likely contributory to diplopia. See series 100, image 24, series 101, image 20. No hemorrhage,   mass lesion, hydrocephalus, or extra-axial  fluid. Generalized atrophy. Extensive T2 and FLAIR hyperintensities throughout the white matter, also involving the brainstem, consistent with small vessel disease. Remote RIGHT occipital infarct was acute in 2011. Vascular: Flow voids are maintained. There are at least five areas of microhemorrhage throughout the brainstem, cerebellum, and cerebral hemispheres, unrelated to the chronic infarct, subcentimeter size, most consistent with hemosiderin deposition from hypertensive cerebrovascular disease. Skull and upper cervical spine: Normal marrow signal. Sinuses/Orbits: Negative orbits. Chronic sinusitis, most notable in the LEFT maxillary region. This has progressed from 2011. Other: None. IMPRESSION: Small focus of restricted diffusion, periaqueductal midbrain, LEFT paramedian location could involve the third nerve nucleus or medial longitudinal fasciculus, likely contributory to the reported symptoms of diplopia. Generalized atrophy. 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 Chronic RIGHT occipital PCA territory infarct, acute in 2011. Chronic sinusitis with significant obstruction/filling of the LEFT maxillary sinus. Consider elective ENT consultation. Electronically Signed   By: Roderic Ovens.D.  On: 08/15/2017 13:49    EKG:   Normal sinus rhythm 66 bpm, IVCD, right bundle branch block, Q waves anterior septal he.  IMPRESSION AND PLAN:   1.  Double vision with intranuclear ophthalmoplegia with him unable to move his left eye past midline.  Case discussed with Dr. George Ina ophthalmology and he recommended ophthalmology follow-up as outpatient and stroke work-up here in the hospital.  Can patch his left eye for better vision.  MRI of the brain, echocardiogram and carotid ultrasound ordered.  Patient already on Lipitor 40.  Check a lipid profile tomorrow morning.  Patient takes aspirin at home.  Add Plavix.  Neurology  consultation.  Physical therapy consultation. 2.  Accelerated hypertension.  Allow permissive hypertension.  I will continue his metoprolol at this time.  PRN IV medication for blood pressure 3. 220/120.  3. Type 2 diabetes mellitus.  Put on sliding scale and decrease Lantus insulin.  Patient states that he has low sugars as outpatient.  And on hemoglobin A1c. 4.  Chronic kidney disease stage III continue to monitor 5.  History of CAD continue to monitor 6.  History of congestive heart failure.  Currently no signs of heart failure continue to monitor    All the records are reviewed and case discussed with ED provider. Management plans discussed with the patient, family and they are in agreement.  CODE STATUS: Full code  TOTAL TIME TAKING CARE OF THIS PATIENT: 50 minutes.    Loletha Grayer M.D on 08/15/2017 at 1:58 PM  Between 7am to 6pm - Pager - [phone]  After 6pm call admission pager [phone]  [facility]  [phone]  CC: Primary care physician; Maryland Pink, MD

## 2017-08-15 NOTE — Progress Notes (Signed)
Patient ID: Ryan Mcclure, male   DOB: [birth year], 84 y.o.   MRN: [id number]  ACP discussion  Patient and friend at the bedside.  Permission to speak medically in front of the friend.  Diagnosis: Double vision which intra-nuclear ophthalmoplegia consistent with stroke.  Accelerated hypertension, type 2 diabetes, chronic kidney disease stage III and history of coronary artery disease and CHF.  CODE STATUS discussed.  Patient would like to be a full code at this point.  Plan.  Because of his double vision and intraventricular ophthalmoplegia a stroke work-up is needed.  Plavix added.  Neurology and physical therapy consultation.  Will need ophthalmology consultation as outpatient.  Patch over his left eye.  Allow permissive hypertension with his accelerated hypertension.  Will need better control of diabetes.  Time spent on ACP discussion 17 minutes Dr. Loletha Grayer

## 2017-08-15 NOTE — Progress Notes (Signed)
*  PRELIMINARY RESULTS* Echocardiogram 2D Echocardiogram has been performed.  Sherrie Sport 08/15/2017, 3:51 PM

## 2017-08-15 NOTE — ED Notes (Signed)
Patient transported to CT 

## 2017-08-15 NOTE — ED Notes (Signed)
Pt on phone with MRI answering screening questions.

## 2017-08-15 NOTE — ED Triage Notes (Signed)
Pt arrives from [facility] via [facility] with complains of weakness "all over," dizziness, lethargy and double-vision for 2-3 days. Pt has hx of DM. Per EMS, pt was reportedly taking 300 units of Humalog daily but changed 4-5 days ago. Pt reports taking approx 8oz/day of water. Hx heart disease, although not able to state exactly what he has been diagnosed with.

## 2017-08-15 NOTE — ED Provider Notes (Signed)
[facility] Emergency Department Provider Note  ____________________________________________  Time seen: Approximately 11:07 AM  I have reviewed the triage vital signs and the nursing notes.   HISTORY  Chief Complaint Weakness    HPI VADA YELLEN is a 84 y.o. male with a history of CVA presenting with diplopia.  The patient reports that he has been seeing double today.  He denies any headache, blurred vision, changes in speech or mental status, difficulty walking, numbness tingling or weakness.   Past Medical History:  Diagnosis Date  . Chronic systolic CHF (congestive heart failure) ([place])   . CKD (chronic kidney disease), stage III ([place])   . Diabetes ([place])   . HBP (high blood pressure)   . Heart attack ([place])   . High cholesterol   . Stroke (cerebrum) ([place])   . Swelling     Patient Active Problem List   Diagnosis Date Noted  . Carotid stenosis 01/31/2016  . Hematochezia   . Diverticulosis of large intestine without hemorrhage   . Hemorrhoid   . GIB (gastrointestinal bleeding) 10/18/2015  . Blood loss anemia 10/18/2015  . CKD (chronic kidney disease), stage III ([place])   . Chronic systolic CHF (congestive heart failure) ([place])   . Stroke (cerebrum) ([place])   . Absolute anemia 06/10/2015  . Cataract cortical, senile 06/10/2015  . Controlled type 2 diabetes mellitus without complication ([place]) 78/29/5621  . Acid reflux 06/10/2015  . Combined fat and carbohydrate induced hyperlipemia 06/10/2015  . Cerebrovascular accident (CVA) ([place]) 06/10/2015  . Non-pressure chronic ulcer of skin of other sites with unspecified severity ([place]) 06/10/2015  . Benign essential HTN 08/25/2014  . Arteriosclerosis of coronary artery 09/09/2013  . Microalbuminuria 09/08/2013  . Diabetes mellitus ([place]) 09/08/2013    Past Surgical History:  Procedure Laterality Date  . COLONOSCOPY N/A 10/21/2015   Procedure: COLONOSCOPY;  Surgeon: Milus Banister, MD;  Location: [facility]  ENDOSCOPY;  Service: Endoscopy;  Laterality: N/A;  . EXPLORATION POST OPERATIVE OPEN HEART    . EYE SURGERY Bilateral     Current Outpatient Rx  . Order #: [id number] Class: Historical Med  . Order #: [id number] Class: Print  . Order #: [id number] Class: Historical Med  . Order #: [id number] Class: Print  . Order #: [id number] Class: Historical Med  . Order #: [id number] Class: Historical Med  . Order #: [id number] Class: Historical Med  . Order #: [id number] Class: Historical Med  . Order #: [id number] Class: Historical Med  . Order #: [id number] Class: Historical Med  . Order #: [id number] Class: Historical Med  . Order #: [id number] Class: Historical Med  . Order #: [id number] Class: Historical Med  . Order #: [id number] Class: Normal    Allergies Neuromuscular blocking agents; Other; and Amlodipine  Family History  Problem Relation Age of Onset  . Diabetes Mellitus I Mother   . Alcoholism Father   . Heart attack Sister   . Prostate cancer Brother     Social History Social History   Tobacco Use  . Smoking status: Former Smoker    Types: Cigarettes    Last attempt to quit: 01/30/1995    Years since quitting: 22.5  . Smokeless tobacco: Never Used  Substance Use Topics  . Alcohol use: No  . Drug use: No    Review of Systems Constitutional: No fever/chills.  No lightheadedness or syncope.  No dizziness. Eyes: Positive diplopia.  Denies blurred vision. ENT: No sore throat. No congestion or rhinorrhea. Cardiovascular: Denies chest pain. Denies palpitations. Respiratory: Denies shortness of breath.  No cough. Gastrointestinal: No abdominal  pain.  No nausea, no vomiting.  No diarrhea.  No constipation. Genitourinary: Negative for dysuria. Musculoskeletal: Negative for back pain. Skin: Negative for rash. Neurological: Negative for headaches. No focal numbness, tingling or weakness.  Positive diplopia.  No changes in speech or mental  status.    ____________________________________________   PHYSICAL EXAM:  VITAL SIGNS: ED Triage Vitals  Enc Vitals Group     BP 08/15/17 0939 (!) 211/98     Pulse Rate 08/15/17 0939 74     Resp 08/15/17 0939 16     Temp 08/15/17 0939 97.8 F (36.6 C)     Temp Source 08/15/17 0939 Oral     SpO2 08/15/17 0939 96 %     Weight 08/15/17 0943 185 lb (83.9 kg)     Height 08/15/17 0943 5\' 7"  (1.702 m)     Head Circumference --      Peak Flow --      Pain Score 08/15/17 0943 0     Pain Loc --      Pain Edu? --      Excl. in [place]? --     Constitutional: Alert and oriented.  Answers questions appropriately.  Tonically ill-appearing but nontoxic. Eyes: Conjunctivae are normal.  Right eye has normal extraocular movements.  The left eye has a medial gaze paralysis. No scleral icterus. Head: Atraumatic. Nose: No congestion/rhinnorhea. Mouth/Throat: Mucous membranes are moist.  Neck: No stridor.  Supple.   Cardiovascular: Normal rate, regular rhythm. No murmurs, rubs or gallops.  Respiratory: Normal respiratory effort.  No accessory muscle use or retractions. Lungs CTAB.  No wheezes, rales or ronchi. Gastrointestinal: Overweight.  Soft, nontender and nondistended.  No guarding or rebound.  No peritoneal signs. Musculoskeletal: No LE edema. No ttp in the calves or palpable cords.  Negative Homan's sign. Neurologic: Alert. Speech is clear.  Face and smile symmetric. Tongue is midline.  Left medial gaze paralysis.  Right extraocular movements are intact.  No horizontal or vertical nystagmus.  The patient has a right-sided hemianopsia on visual field testing.  No pronator drift. 5 out of 5 grip, biceps, triceps, hip flexors, plantar flexion and dorsiflexion. Normal sensation to light touch in the bilateral upper and lower extremities, and face. Normal heel-to-shin without ataxia. Skin:  Skin is warm, dry and intact. No rash noted. Psychiatric: Mood and affect are normal. Speech and behavior are  normal.  Normal judgement  ____________________________________________   LABS (all labs ordered are listed, but only abnormal results are displayed)  Labs Reviewed  TROPONIN I - Abnormal; Notable for the following components:      Result Value   Troponin I 0.03 (*)    All other components within normal limits  CBC - Abnormal; Notable for the following components:   RBC 4.14 (*)    Hemoglobin 11.2 (*)    HCT 34.8 (*)    RDW 14.8 (*)    Platelets 146 (*)    All other components within normal limits  COMPREHENSIVE METABOLIC PANEL - Abnormal; Notable for the following components:   Glucose, Bld 149 (*)    BUN 28 (*)    Creatinine, Ser 1.74 (*)    GFR calc non Af Amer 35 (*)    GFR calc Af Amer 40 (*)    Anion gap 4 (*)    All other components within normal limits  URINALYSIS, COMPLETE (UACMP) WITH MICROSCOPIC - Abnormal; Notable for the following components:   Color, Urine STRAW (*)    APPearance CLEAR (*)  All other components within normal limits  GLUCOSE, CAPILLARY - Abnormal; Notable for the following components:   Glucose-Capillary 131 (*)    All other components within normal limits  CBG MONITORING, ED   ____________________________________________  EKG  ED ECG REPORT I, Eula Listen, the attending physician, personally viewed and interpreted this ECG.   Date: 08/15/2017  EKG Time: 938  Rate: 66  Rhythm: normal sinus rhythm  Axis: leftward  Intervals:first-degree A-V block   ST&T Change: No STEMI  ____________________________________________  RADIOLOGY  Ct Head Wo Contrast  Result Date: 08/15/2017 CLINICAL DATA:  Dizziness and lethargy.  Diplopia. EXAM: CT HEAD WITHOUT CONTRAST TECHNIQUE: Contiguous axial images were obtained from the base of the skull through the vertex without intravenous contrast. COMPARISON:  February 21, 2011 FINDINGS: Brain: Moderate diffuse atrophy is stable. There is no intracranial mass, hemorrhage, extra-axial fluid  collection, or midline shift. There is evidence of a prior infarct in the medial right occipital lobe, stable. There is patchy small vessel disease throughout the centra semiovale bilaterally, stable. A punctate calcification in the left parietal lobe is stable and probably represents a small granuloma. No new gray-white compartment lesions are evident. No evident acute infarct. Vascular: There is no appreciable hyperdense vessel. There is calcification in each carotid siphon region. Skull: Bony calvarium appears intact. Sinuses/Orbits: There is opacification of the visualized superior left maxillary antrum. There is mucosal thickening in several ethmoid air cells bilaterally. Other visualized paranasal sinuses are clear. Note that frontal sinuses are hypoplastic. Visualized orbits appear symmetric bilaterally. Note that patient has had cataract removal bilaterally. Other: Visualized mastoid air cells are clear. IMPRESSION: 1. Stable atrophy with periventricular small vessel disease. Prior infarct medial right occipital lobe. Probable small granuloma, calcified, left parietal lobe, stable. No acute infarct evident. No mass or hemorrhage. 2.  Foci of arterial vascular calcification. 3.  Foci of paranasal sinus disease. Electronically Signed   By: Lowella Grip III M.D.   On: 08/15/2017 10:43    ____________________________________________   PROCEDURES  Procedure(s) performed: None  Procedures  Critical Care performed: Yes, see critical care note(s) ____________________________________________   INITIAL IMPRESSION / ASSESSMENT AND PLAN / ED COURSE  Pertinent labs & imaging results that were available during my care of the patient were reviewed by me and considered in my medical decision making (see chart for details).  84 year old male with a history of prior CVA presenting with diplopia, right hemianopsia, and left eye medial gaze paralysis.  The patient CT scan does not show any new  intracranial abnormality; he does have evidence of a prior stroke.  The patient otherwise has reassuring laboratory studies.  I am concerned about stroke and an aspirin and MRI without contrast has been ordered.  I spoke with Dr. Doy Mince, the neurologist on-call, who will evaluate the patient.  I have admitted the patient to the hospitalist.   CRITICAL CARE Performed by: Eula Listen   Total critical care time: 35 minutes  Critical care time was exclusive of separately billable procedures and treating other patients.  Critical care was necessary to treat or prevent imminent or life-threatening deterioration.  Critical care was time spent personally by me on the following activities: development of treatment plan with patient and/or surrogate as well as nursing, discussions with consultants, evaluation of patient's response to treatment, examination of patient, obtaining history from patient or surrogate, ordering and performing treatments and interventions, ordering and review of laboratory studies, ordering and review of radiographic studies, pulse oximetry and re-evaluation of  patient's condition.   ____________________________________________  FINAL CLINICAL IMPRESSION(S) / ED DIAGNOSES  Final diagnoses:  Hemianopsia  Diplopia  Paralysis of conjugate gaze  Cerebrovascular accident (CVA), unspecified mechanism ([place])         NEW MEDICATIONS STARTED DURING THIS VISIT:  New Prescriptions   No medications on file      Eula Listen, MD 08/15/17 1119

## 2017-08-15 NOTE — ED Notes (Signed)
Patient transported to MRI 

## 2017-08-15 NOTE — ED Notes (Signed)
Pt not in room at this time. Will start fluid bolus upon pt's return.

## 2017-08-16 DIAGNOSIS — I639 Cerebral infarction, unspecified: Secondary | ICD-10-CM | POA: Diagnosis not present

## 2017-08-16 DIAGNOSIS — H532 Diplopia: Secondary | ICD-10-CM | POA: Diagnosis not present

## 2017-08-16 LAB — CBC
HEMATOCRIT: 33.5 % — AB (ref 40.0–52.0)
HEMOGLOBIN: 10.8 g/dL — AB (ref 13.0–18.0)
MCH: 27 pg (ref 26.0–34.0)
MCHC: 32.3 g/dL (ref 32.0–36.0)
MCV: 83.6 fL (ref 80.0–100.0)
PLATELETS: 140 10*3/uL — AB (ref 150–440)
RBC: 4 MIL/uL — AB (ref 4.40–5.90)
RDW: 14.9 % — ABNORMAL HIGH (ref 11.5–14.5)
WBC: 8 10*3/uL (ref 3.8–10.6)

## 2017-08-16 LAB — BASIC METABOLIC PANEL
ANION GAP: 5 (ref 5–15)
BUN: 27 mg/dL — ABNORMAL HIGH (ref 8–23)
CO2: 29 mmol/L (ref 22–32)
CREATININE: 1.54 mg/dL — AB (ref 0.61–1.24)
Calcium: 8.9 mg/dL (ref 8.9–10.3)
Chloride: 109 mmol/L (ref 98–111)
GFR calc non Af Amer: 40 mL/min — ABNORMAL LOW (ref >60)
GFR, EST AFRICAN AMERICAN: 46 mL/min — AB (ref >60)
Glucose, Bld: 153 mg/dL — ABNORMAL HIGH (ref 70–99)
POTASSIUM: 4.5 mmol/L (ref 3.5–5.1)
SODIUM: 143 mmol/L (ref 135–145)

## 2017-08-16 LAB — LIPID PANEL
CHOL/HDL RATIO: 2.6 ratio
Cholesterol: 136 mg/dL (ref 0–200)
HDL: 52 mg/dL (ref >40)
LDL Cholesterol: 70 mg/dL (ref 0–99)
Triglycerides: 70 mg/dL (ref <150)
VLDL: 14 mg/dL (ref 0–40)

## 2017-08-16 LAB — GLUCOSE, CAPILLARY
Glucose-Capillary: 133 mg/dL — ABNORMAL HIGH (ref 70–99)
Glucose-Capillary: 180 mg/dL — ABNORMAL HIGH (ref 70–99)

## 2017-08-16 MED ORDER — CLOPIDOGREL BISULFATE 75 MG PO TABS
75.0000 mg | ORAL_TABLET | Freq: Every day | ORAL | 0 refills | Status: AC
Start: 2017-08-17 — End: 2017-09-16

## 2017-08-16 MED ORDER — ENOXAPARIN SODIUM 40 MG/0.4ML ~~LOC~~ SOLN: 40.0000 mg | SUBCUTANEOUS | Status: DC

## 2017-08-16 NOTE — Discharge Summary (Signed)
[facility] NAME: Ryan Mcclure    MR#:  [id number]  DATE OF BIRTH:  13-Feb-1931  DATE OF ADMISSION:  08/15/2017 ADMITTING PHYSICIAN: Loletha Grayer, MD  DATE OF DISCHARGE: 08/16/2017  PRIMARY CARE PHYSICIAN: Maryland Pink, MD   ADMISSION DIAGNOSIS:  Diplopia [H53.2] Hemianopsia [H53.47] Stroke (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 Chronic systolic CHF (congestive heart failure) ([place])   . CKD (chronic kidney disease), stage III ([place])   . Diabetes ([place])   . HBP (high blood pressure)   . Heart attack ([place])   . High cholesterol   . Stroke (cerebrum) ([place])   . Swelling      ADMITTING HISTORY Tidus Upchurch  is a 84 y.o. male presents with a few days of blurred vision.  He states he is seeing double with one image on top of the other.  He states if he covers one eye he can see okay if he covers the other eye he can see okay.  He is disoriented with walking   Because of his vision.  He does not feel weak one side versus the other.  Hospitalist services were contacted for admission for stroke work-up.  HOSPITAL COURSE:  Patient admitted to medical floor.  Case was discussed with ophthalmology on-call and the left eye was patched.  Patient was worked up with MRI brain and carotid ultrasound.  Carotid ultrasound showed bilateral carotid endarterectomy but no recurrent stenosis MRI showed old occipital CVA but no.  Evidence of any new onset CVA.  Patient received aspirin and statin medication in the hospital and physical therapy.  He was evaluated by neurology attending during hospitalization who recommended oral Plavix apart from  aspirin and statin.  Patient hemodynamically stable will be discharged home with home health services and follow-up with neurology and ophthalmology as outpatient.  CONSULTS OBTAINED:  Treatment Team:  Catarina Hartshorn, MD  DRUG ALLERGIES:   Allergies  Allergen Reactions  . Neuromuscular Blocking Agents Nausea And Vomiting and Other (See Comments)    Tongue swelling, lips swelling   . Other Other (See Comments)    Tongue/lips swelling with steroid dose pack, pt does not recall exact name.   . Amlodipine Nausea And Vomiting    DISCHARGE MEDICATIONS:   Allergies as of 08/16/2017      Reactions   Neuromuscular Blocking Agents Nausea And Vomiting, Other (See Comments)   Tongue swelling, lips swelling    Other Other (See Comments)   Tongue/lips swelling with steroid dose pack, pt does not recall exact name.    Amlodipine Nausea And Vomiting      Medication List    TAKE these medications   atorvastatin 40 MG tablet Commonly known as:  LIPITOR Take 40 mg by mouth daily.   blood glucose meter kit and supplies Kit Dispense based on patient and insurance preference. Use up to four times daily as directed. (FOR ICD-9 250.00, 250.01).   clopidogrel 75 MG tablet Commonly known as:  PLAVIX Take 1 tablet (75 mg total) by mouth daily. Start taking on:  08/17/2017   ferrous sulfate 325 (65 FE) MG tablet Take 325 mg by mouth 3 (three) times daily.   fluticasone 50 MCG/ACT nasal spray Commonly known as:  FLONASE Place 1 spray into both nostrils daily as needed for allergies.   furosemide 40 MG tablet Commonly known as:  LASIX Take 40 mg by mouth 2 (two) times daily.   insulin aspart 100 UNIT/ML FlexPen Commonly known as:  NOVOLOG Inject 8u under the skin at breakfast, 12u at lunchtime and 16u at dinner time - max daily dose: 50u   LANTUS SOLOSTAR 100 UNIT/ML Solostar Pen Generic drug:  Insulin Glargine Inject 26 Units into the skin at bedtime.   lisinopril 5 MG  tablet Commonly known as:  PRINIVIL,ZESTRIL Take 5 mg by mouth daily.   Medical Compression Stockings Misc Please provide 38mHg compression stockings   metFORMIN 500 MG tablet Commonly known as:  GLUCOPHAGE Take 500 mg by mouth 2 (two) times daily.   metoprolol tartrate 50 MG tablet Commonly known as:  LOPRESSOR Take 50 mg by mouth 2 (two) times daily.   NITROSTAT 0.4 MG SL tablet Generic drug:  nitroGLYCERIN take 1 tablet under the tongue every 5 minutes if needed for chest pain   omeprazole 40 MG capsule Commonly known as:  PRILOSEC Take 40 mg by mouth 2 (two) times daily.   RA ASPIRIN EC ADULT LOW ST 81 MG EC tablet Generic drug:  aspirin Take 1 tablet (81 mg total) by mouth daily.   RA VITAMIN B-12 TR 1000 MCG Tbcr Generic drug:  Cyanocobalamin Take 1 tablet by mouth daily.       Today  Patient seen and evaluated today  has patch to left eye  passed swallow study and tolerating diet  VITAL SIGNS:  Blood pressure (!) 176/69, pulse (!) 58, temperature 98 F (36.7 C), temperature source Oral, resp. rate 20, height 5' 7"  (1.702 m), weight 85.7 kg (188 lb 15 oz), SpO2 97 %.  I/O:    Intake/Output Summary (Last 24 hours) at 08/16/2017 1256 Last data filed at 08/16/2017 1000 Gross per 24 hour  Intake 720 ml  Output 900 ml  Net -180 ml    PHYSICAL EXAMINATION:  Physical Exam  GENERAL:  84y.o.-year-old patient lying in the bed with no acute distress.  Patch left eye LUNGS: Normal breath sounds bilaterally, no wheezing, rales,rhonchi or crepitation. No use of accessory muscles of respiration.  CARDIOVASCULAR: S1, S2 normal. No murmurs, rubs, or gallops.  ABDOMEN: Soft, non-tender, non-distended. Bowel sounds present. No organomegaly or mass.  NEUROLOGIC: Moves all 4 extremities. PSYCHIATRIC: The patient is alert and oriented x 3.  SKIN: No obvious rash, lesion, or ulcer.   DATA REVIEW:   CBC Recent Labs  Lab 08/16/17 0352  WBC 8.0  HGB 10.8*  HCT  33.5*  PLT 140*    Chemistries  Recent Labs  Lab 08/15/17 0948 08/16/17 0352  NA 141 143  K 4.3 4.5  CL 107 109  CO2 30 29  GLUCOSE 149* 153*  BUN 28* 27*  CREATININE 1.74* 1.54*  CALCIUM 9.1 8.9  AST 19  --   ALT 14  --   ALKPHOS 74  --   BILITOT 0.6  --     Cardiac Enzymes Recent Labs  Lab 08/15/17 0948  TROPONINI 0.03*    Microbiology Results  Results for orders placed or performed during the hospital encounter of 10/18/15  MRSA PCR Screening     Status: None   Collection Time: 10/18/15  8:51 PM  Result Value Ref Range Status   MRSA by PCR NEGATIVE NEGATIVE Final    Comment:  The GeneXpert MRSA Assay (FDA approved for NASAL specimens only), is one component of a comprehensive MRSA colonization surveillance program. It is not intended to diagnose MRSA infection nor to guide or monitor treatment for MRSA infections.     RADIOLOGY:  Ct Head Wo Contrast  Result Date: 08/15/2017 CLINICAL DATA:  Dizziness and lethargy.  Diplopia. EXAM: CT HEAD WITHOUT CONTRAST TECHNIQUE: Contiguous axial images were obtained from the base of the skull through the vertex without intravenous contrast. COMPARISON:  February 21, 2011 FINDINGS: Brain: Moderate diffuse atrophy is stable. There is no intracranial mass, hemorrhage, extra-axial fluid collection, or midline shift. There is evidence of a prior infarct in the medial right occipital lobe, stable. There is patchy small vessel disease throughout the centra semiovale bilaterally, stable. A punctate calcification in the left parietal lobe is stable and probably represents a small granuloma. No new gray-white compartment lesions are evident. No evident acute infarct. Vascular: There is no appreciable hyperdense vessel. There is calcification in each carotid siphon region. Skull: Bony calvarium appears intact. Sinuses/Orbits: There is opacification of the visualized superior left maxillary antrum. There is mucosal thickening in  several ethmoid air cells bilaterally. Other visualized paranasal sinuses are clear. Note that frontal sinuses are hypoplastic. Visualized orbits appear symmetric bilaterally. Note that patient has had cataract removal bilaterally. Other: Visualized mastoid air cells are clear. IMPRESSION: 1. Stable atrophy with periventricular small vessel disease. Prior infarct medial right occipital lobe. Probable small granuloma, calcified, left parietal lobe, stable. No acute infarct evident. No mass or hemorrhage. 2.  Foci of arterial vascular calcification. 3.  Foci of paranasal sinus disease. Electronically Signed   By: Lowella Grip III M.D.   On: 08/15/2017 10:43   Mr Brain Wo Contrast  Result Date: 08/15/2017 CLINICAL DATA:  History of CVA presenting with diplopia. History of hypertension, diabetes, and chronic kidney disease. EXAM: MRI HEAD WITHOUT CONTRAST TECHNIQUE: Multiplanar, multiecho pulse sequences of the brain and surrounding structures were obtained without intravenous contrast. COMPARISON:  CT head 08/15/2017.  MR head 11/06/2009. FINDINGS: Brain: Small focus of restricted diffusion, corresponding low ADC, periaqueductal midbrain, LEFT paramedian location, could involve third nerve nucleus or medial longitudinal fasciculus, likely contributory to diplopia. See series 100, image 24, series 101, image 20. No hemorrhage,   mass lesion, hydrocephalus, or extra-axial fluid. Generalized atrophy. Extensive T2 and FLAIR hyperintensities throughout the white matter, also involving the brainstem, consistent with small vessel disease. Remote RIGHT occipital infarct was acute in 2011. Vascular: Flow voids are maintained. There are at least five areas of microhemorrhage throughout the brainstem, cerebellum, and cerebral hemispheres, unrelated to the chronic infarct, subcentimeter size, most consistent with hemosiderin deposition from hypertensive cerebrovascular disease. Skull and upper cervical spine: Normal  marrow signal. Sinuses/Orbits: Negative orbits. Chronic sinusitis, most notable in the LEFT maxillary region. This has progressed from 2011. Other: None. IMPRESSION: Small focus of restricted diffusion, periaqueductal midbrain, LEFT paramedian location could involve the third nerve nucleus or medial longitudinal fasciculus, likely contributory to the reported symptoms of diplopia. Generalized atrophy. 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 Chronic RIGHT occipital PCA territory infarct, acute in 2011. Chronic sinusitis with significant obstruction/filling of the LEFT maxillary sinus. Consider elective ENT consultation. Electronically Signed   By: Staci Righter M.D.   On: 08/15/2017 13:49   US Carotid Bilateral  Result Date: 08/15/2017 CLINICAL DATA:  Stroke-like symptoms. Previous bilateral carotid endarterectomy. EXAM: BILATERAL CAROTID DUPLEX ULTRASOUND TECHNIQUE: Pearline Cables scale imaging, color  Doppler and duplex ultrasound was performed of bilateral carotid and vertebral arteries in the neck. COMPARISON:  None. TECHNIQUE: Quantification of carotid stenosis is based on velocity parameters that correlate the residual internal carotid diameter with NASCET-based stenosis levels, using the diameter of the distal internal carotid lumen as the denominator for stenosis measurement. The following velocity measurements were obtained: PEAK SYSTOLIC/END DIASTOLIC RIGHT ICA:                     81/21cm/sec CCA:                     29/4TM/LYY SYSTOLIC ICA/CCA RATIO:  1.4 ECA:                     58cm/sec LEFT ICA:                     84/14cm/sec CCA:                     50/3TW/SFK SYSTOLIC ICA/CCA RATIO:  1.4 ECA:                     69cm/sec FINDINGS: RIGHT CAROTID ARTERY: The bulb appears capacious. Mild smooth eccentric partially calcified plaque. Normal waveforms and color Doppler signal. ICA mildly tortuous. RIGHT VERTEBRAL ARTERY:   Normal flow direction and waveform. LEFT CAROTID ARTERY: Mild eccentric plaque or suture material with ring down artifact along the carotid bulb which is capacious. No high-grade stenosis. Normal waveforms and color Doppler signal. LEFT VERTEBRAL ARTERY: Normal flow direction and waveform. IMPRESSION: 1. No evidence of residual/recurrent stenosis post bilateral carotid endarterectomy. 2.  Antegrade bilateral vertebral arterial flow. Electronically Signed   By: Lucrezia Europe M.D.   On: 08/15/2017 15:26    Follow up with PCP in 1 week.  Management plans discussed with the patient, family and they are in agreement.  CODE STATUS: Full code    Code Status Orders  (From admission, onward)        Start     Ordered   08/15/17 1206  Full code  Continuous     08/15/17 1205    Code Status History    Date Active Date Inactive Code Status Order ID Comments User Context   10/18/2015 2150 10/23/2015 1520 Full Code [id number]  Ivor Costa, MD Inpatient      TOTAL TIME TAKING CARE OF THIS PATIENT ON DAY OF DISCHARGE: more than 34 minutes.   Saundra Shelling M.D on 08/16/2017 at 12:56 PM  Between 7am to 6pm - Pager - [phone]  After 6pm go to [email or web address] - password [email or web address] [facility]  Office  [phone]  CC: Primary care physician; Maryland Pink, MD  Note: This dictation was prepared with Dragon dictation along with smaller phrase technology. Any transcriptional errors that result from this process are unintentional.

## 2017-08-16 NOTE — Consult Note (Addendum)
Referring Physician: Pyreddy    Chief Complaint: Diplopia  HPI: Ryan Mcclure is an 84 y.o. male who reports that 4 days ago he had acute onset of diplopia.  Symptoms lasted for the day but resolved spontaneously by nighttime.  Patient awakened the next day with return of the diplopia.  Has had no improvement since and presented for evaluation.  Described diplopia as being at an angle.   Initial NIHSS of 1.  Date last known well: Date: 08/13/2017 Time last known well: Unable to determine tPA Given: No: Outside time window  Past Medical History:  Diagnosis Date  . Chronic systolic CHF (congestive heart failure) ([place])   . CKD (chronic kidney disease), stage III ([place])   . Diabetes ([place])   . HBP (high blood pressure)   . Heart attack ([place])   . High cholesterol   . Stroke (cerebrum) ([place])   . Swelling     Past Surgical History:  Procedure Laterality Date  . COLONOSCOPY N/A 10/21/2015   Procedure: COLONOSCOPY;  Surgeon: Milus Banister, MD;  Location: [facility];  Service: Endoscopy;  Laterality: N/A;  . EXPLORATION POST OPERATIVE OPEN HEART    . EYE SURGERY Bilateral     Family History  Problem Relation Age of Onset  . Diabetes Mellitus I Mother   . Kidney failure Mother   . Alcoholism Father   . Heart attack Sister   . Prostate cancer Brother    Social History:  reports that he quit smoking about 22 years ago. His smoking use included cigarettes. He has never used smokeless tobacco. He reports that he does not drink alcohol or use drugs.  Allergies:  Allergies  Allergen Reactions  . Neuromuscular Blocking Agents Nausea And Vomiting and Other (See Comments)    Tongue swelling, lips swelling   . Other Other (See Comments)    Tongue/lips swelling with steroid dose pack, pt does not recall exact name.   . Amlodipine Nausea And Vomiting    Medications:  I have reviewed the patient's current medications. Prior to Admission:  Medications Prior to Admission  Medication  Sig Dispense Refill Last Dose  . atorvastatin (LIPITOR) 40 MG tablet Take 40 mg by mouth daily.   08/14/2017 at 2000  . blood glucose meter kit and supplies KIT Dispense based on patient and insurance preference. Use up to four times daily as directed. (FOR ICD-9 250.00, 250.01). 1 each 0   . Cyanocobalamin (RA VITAMIN B-12 TR) 1000 MCG TBCR Take 1 tablet by mouth daily.    08/14/2017 at 0800  . Elastic Bandages & Supports (MEDICAL COMPRESSION STOCKINGS) MISC Please provide 108mHg compression stockings 1 each 0   . ferrous sulfate 325 (65 FE) MG tablet Take 325 mg by mouth 3 (three) times daily.   1 08/14/2017 at 1900  . fluticasone (FLONASE) 50 MCG/ACT nasal spray Place 1 spray into both nostrils daily as needed for allergies.    PRN at PRN  . furosemide (LASIX) 40 MG tablet Take 40 mg by mouth 2 (two) times daily.    08/14/2017 at 1700  . insulin aspart (NOVOLOG) 100 UNIT/ML FlexPen Inject 8u under the skin at breakfast, 12u at lunchtime and 16u at dinner time - max daily dose: 50u   08/14/2017 at 1700  . Insulin Glargine (LANTUS SOLOSTAR) 100 UNIT/ML Solostar Pen Inject 26 Units into the skin at bedtime.    08/14/2017 at 2000  . lisinopril (PRINIVIL,ZESTRIL) 5 MG tablet Take 5 mg by mouth daily.  0 08/14/2017 at 0800  . metFORMIN (GLUCOPHAGE) 500 MG tablet Take 500 mg by mouth 2 (two) times daily.  2 08/14/2017 at 1700  . metoprolol (LOPRESSOR) 50 MG tablet Take 50 mg by mouth 2 (two) times daily.   0 08/14/2017 at 1700  . nitroGLYCERIN (NITROSTAT) 0.4 MG SL tablet take 1 tablet under the tongue every 5 minutes if needed for chest pain   PRN at PRN  . omeprazole (PRILOSEC) 40 MG capsule Take 40 mg by mouth 2 (two) times daily.    08/14/2017 at 1700  . RA ASPIRIN EC ADULT LOW ST 81 MG EC tablet Take 1 tablet (81 mg total) by mouth daily. 30 tablet 0 08/14/2017 at 0800   Scheduled: . aspirin  81 mg Oral Daily  . atorvastatin  40 mg Oral q1800  . clopidogrel  75 mg Oral Daily  . enoxaparin (LOVENOX)  injection  40 mg Subcutaneous Q24H  . ferrous sulfate  325 mg Oral TID  . insulin aspart  0-5 Units Subcutaneous QHS  . insulin aspart  0-9 Units Subcutaneous TID WC  . insulin glargine  18 Units Subcutaneous QHS  . metoprolol tartrate  50 mg Oral BID  . pantoprazole  40 mg Oral Daily  . vitamin B-12  1,000 mcg Oral Daily    ROS: History obtained from the patient  General ROS: negative for - chills, fatigue, fever, night sweats, weight gain or weight loss Psychological ROS: negative for - behavioral disorder, hallucinations, memory difficulties, mood swings or suicidal ideation Ophthalmic ROS: negative for - blurry vision, double vision, eye pain or loss of vision ENT ROS: negative for - epistaxis, nasal discharge, oral lesions, sore throat, tinnitus or vertigo Allergy and Immunology ROS: negative for - hives or itchy/watery eyes Hematological and Lymphatic ROS: negative for - bleeding problems, bruising or swollen lymph nodes Endocrine ROS: negative for - galactorrhea, hair pattern changes, polydipsia/polyuria or temperature intolerance Respiratory ROS: negative for - cough, hemoptysis, shortness of breath or wheezing Cardiovascular ROS: LE edema Gastrointestinal ROS: negative for - abdominal pain, diarrhea, hematemesis, nausea/vomiting or stool incontinence Genito-Urinary ROS: negative for - dysuria, hematuria, incontinence or urinary frequency/urgency Musculoskeletal ROS: negative for - joint swelling or muscular weakness Neurological ROS: as noted in HPI Dermatological ROS: negative for rash and skin lesion changes  Physical Examination: Blood pressure (!) 172/76, pulse 68, temperature 98.7 F (37.1 C), temperature source Oral, resp. rate 16, height 5' 7"  (1.702 m), weight 85.7 kg (188 lb 15 oz), SpO2 92 %.  HEENT-  Normocephalic, no lesions, without obvious abnormality.  Normal external eye and conjunctiva.  Normal TM's bilaterally.  Normal auditory canals and external ears.  Normal external nose, mucus membranes and septum.  Normal pharynx. Cardiovascular- S1, S2 normal, pulses palpable throughout   Lungs- chest clear, no wheezing, rales, normal symmetric air entry Abdomen- soft, non-tender; bowel sounds normal; no masses,  no organomegaly Extremities- BLE edema, left greater than right Lymph-no adenopathy palpable Musculoskeletal-no joint tenderness, deformity or swelling Skin-warm and dry, no hyperpigmentation, vitiligo, or suspicious lesions  Neurological Examination   Mental Status: Alert, oriented, thought content appropriate.  Speech fluent without evidence of aphasia.  Able to follow 3 step commands without difficulty. Cranial Nerves: II: Discs flat bilaterally; LHH, pupils equal, round, reactive to light and accommodation III,IV, VI: ptosis not present, on forward gaze right eye deviates to the right.  Left eye unable to go beyond midline to the right.   V,VII: smile symmetric, facial light touch sensation  normal bilaterally VIII: hearing normal bilaterally IX,X: gag reflex present XI: bilateral shoulder shrug XII: midline tongue extension Motor: Right : Upper extremity   5/5    Left:     Upper extremity   5/5  Lower extremity   5/5     Lower extremity   5/5 Tone and bulk:normal tone throughout; no atrophy noted Sensory: Pinprick and light touch intact throughout, bilaterally Deep Tendon Reflexes: 2+ in the upper extremities and absent in the lower extremities Plantars: Right: downgoing   Left: downgoing Cerebellar: Normal finger-to-nose and normal heel-to-shin testing bilaterally Gait: not tested due to safety concerns    Laboratory Studies:  Basic Metabolic Panel: Recent Labs  Lab 08/15/17 0948 08/16/17 0352  NA 141 143  K 4.3 4.5  CL 107 109  CO2 30 29  GLUCOSE 149* 153*  BUN 28* 27*  CREATININE 1.74* 1.54*  CALCIUM 9.1 8.9    Liver Function Tests: Recent Labs  Lab 08/15/17 0948  AST 19  ALT 14  ALKPHOS 74  BILITOT 0.6   PROT 7.4  ALBUMIN 3.6   No results for input(s): LIPASE, AMYLASE in the last 168 hours. No results for input(s): AMMONIA in the last 168 hours.  CBC: Recent Labs  Lab 08/15/17 0948 08/16/17 0352  WBC 7.8 8.0  HGB 11.2* 10.8*  HCT 34.8* 33.5*  MCV 84.1 83.6  PLT 146* 140*    Cardiac Enzymes: Recent Labs  Lab 08/15/17 0948  TROPONINI 0.03*    BNP: Invalid input(s): POCBNP  CBG: Recent Labs  Lab 08/15/17 0958 08/15/17 2149 08/15/17 2304 08/16/17 0812  GLUCAP 131* 246* 213* 133*    Microbiology: Results for orders placed or performed during the hospital encounter of 10/18/15  MRSA PCR Screening     Status: None   Collection Time: 10/18/15  8:51 PM  Result Value Ref Range Status   MRSA by PCR NEGATIVE NEGATIVE Final    Comment:        The GeneXpert MRSA Assay (FDA approved for NASAL specimens only), is one component of a comprehensive MRSA colonization surveillance program. It is not intended to diagnose MRSA infection nor to guide or monitor treatment for MRSA infections.     Coagulation Studies: No results for input(s): LABPROT, INR in the last 72 hours.  Urinalysis:  Recent Labs  Lab 08/15/17 1037  COLORURINE STRAW*  LABSPEC 1.006  PHURINE 7.0  GLUCOSEU NEGATIVE  HGBUR NEGATIVE  BILIRUBINUR NEGATIVE  KETONESUR NEGATIVE  PROTEINUR NEGATIVE  NITRITE NEGATIVE  LEUKOCYTESUR NEGATIVE    Lipid Panel:    Component Value Date/Time   CHOL 136 08/16/2017 0352   CHOL 86 02/22/2011 0325   TRIG 70 08/16/2017 0352   TRIG 75 02/22/2011 0325   HDL 52 08/16/2017 0352   HDL 45 02/22/2011 0325   CHOLHDL 2.6 08/16/2017 0352   VLDL 14 08/16/2017 0352   VLDL 15 02/22/2011 0325   LDLCALC 70 08/16/2017 0352   LDLCALC 26 02/22/2011 0325    HgbA1C:  Lab Results  Component Value Date   HGBA1C 8.0 (H) 08/15/2017    Urine Drug Screen:  No results found for: LABOPIA, COCAINSCRNUR, LABBENZ, AMPHETMU, THCU, LABBARB  Alcohol Level: No results for  input(s): ETH in the last 168 hours.   Imaging: Ct Head Wo Contrast  Result Date: 08/15/2017 CLINICAL DATA:  Dizziness and lethargy.  Diplopia. EXAM: CT HEAD WITHOUT CONTRAST TECHNIQUE: Contiguous axial images were obtained from the base of the skull through the vertex without intravenous contrast. COMPARISON:  February 21, 2011 FINDINGS: Brain: Moderate diffuse atrophy is stable. There is no intracranial mass, hemorrhage, extra-axial fluid collection, or midline shift. There is evidence of a prior infarct in the medial right occipital lobe, stable. There is patchy small vessel disease throughout the centra semiovale bilaterally, stable. A punctate calcification in the left parietal lobe is stable and probably represents a small granuloma. No new gray-white compartment lesions are evident. No evident acute infarct. Vascular: There is no appreciable hyperdense vessel. There is calcification in each carotid siphon region. Skull: Bony calvarium appears intact. Sinuses/Orbits: There is opacification of the visualized superior left maxillary antrum. There is mucosal thickening in several ethmoid air cells bilaterally. Other visualized paranasal sinuses are clear. Note that frontal sinuses are hypoplastic. Visualized orbits appear symmetric bilaterally. Note that patient has had cataract removal bilaterally. Other: Visualized mastoid air cells are clear. IMPRESSION: 1. Stable atrophy with periventricular small vessel disease. Prior infarct medial right occipital lobe. Probable small granuloma, calcified, left parietal lobe, stable. No acute infarct evident. No mass or hemorrhage. 2.  Foci of arterial vascular calcification. 3.  Foci of paranasal sinus disease. Electronically Signed   By: Lowella Grip III M.D.   On: 08/15/2017 10:43   Mr Brain Wo Contrast  Result Date: 08/15/2017 CLINICAL DATA:  History of CVA presenting with diplopia. History of hypertension, diabetes, and chronic kidney disease. EXAM: MRI  HEAD WITHOUT CONTRAST TECHNIQUE: Multiplanar, multiecho pulse sequences of the brain and surrounding structures were obtained without intravenous contrast. COMPARISON:  CT head 08/15/2017.  MR head 11/06/2009. FINDINGS: Brain: Small focus of restricted diffusion, corresponding low ADC, periaqueductal midbrain, LEFT paramedian location, could involve third nerve nucleus or medial longitudinal fasciculus, likely contributory to diplopia. See series 100, image 24, series 101, image 20. No hemorrhage,   mass lesion, hydrocephalus, or extra-axial fluid. Generalized atrophy. Extensive T2 and FLAIR hyperintensities throughout the white matter, also involving the brainstem, consistent with small vessel disease. Remote RIGHT occipital infarct was acute in 2011. Vascular: Flow voids are maintained. There are at least five areas of microhemorrhage throughout the brainstem, cerebellum, and cerebral hemispheres, unrelated to the chronic infarct, subcentimeter size, most consistent with hemosiderin deposition from hypertensive cerebrovascular disease. Skull and upper cervical spine: Normal marrow signal. Sinuses/Orbits: Negative orbits. Chronic sinusitis, most notable in the LEFT maxillary region. This has progressed from 2011. Other: None. IMPRESSION: Small focus of restricted diffusion, periaqueductal midbrain, LEFT paramedian location could involve the third nerve nucleus or medial longitudinal fasciculus, likely contributory to the reported symptoms of diplopia. Generalized atrophy. 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 Chronic RIGHT occipital PCA territory infarct, acute in 2011. Chronic sinusitis with significant obstruction/filling of the LEFT maxillary sinus. Consider elective ENT consultation. Electronically Signed   By: Staci Righter M.D.   On: 08/15/2017 13:49   US Carotid Bilateral  Result Date: 08/15/2017 CLINICAL DATA:   Stroke-like symptoms. Previous bilateral carotid endarterectomy. EXAM: BILATERAL CAROTID DUPLEX ULTRASOUND TECHNIQUE: Pearline Cables scale imaging, color Doppler and duplex ultrasound was performed of bilateral carotid and vertebral arteries in the neck. COMPARISON:  None. TECHNIQUE: Quantification of carotid stenosis is based on velocity parameters that correlate the residual internal carotid diameter with NASCET-based stenosis levels, using the diameter of the distal internal carotid lumen as the denominator for stenosis measurement. The following velocity measurements were obtained: PEAK SYSTOLIC/END DIASTOLIC RIGHT ICA:  81/21cm/sec CCA:                     81/1XB/WIO SYSTOLIC ICA/CCA RATIO:  1.4 ECA:                     58cm/sec LEFT ICA:                     84/14cm/sec CCA:                     03/5DH/RCB SYSTOLIC ICA/CCA RATIO:  1.4 ECA:                     69cm/sec FINDINGS: RIGHT CAROTID ARTERY: The bulb appears capacious. Mild smooth eccentric partially calcified plaque. Normal waveforms and color Doppler signal. ICA mildly tortuous. RIGHT VERTEBRAL ARTERY:  Normal flow direction and waveform. LEFT CAROTID ARTERY: Mild eccentric plaque or suture material with ring down artifact along the carotid bulb which is capacious. No high-grade stenosis. Normal waveforms and color Doppler signal. LEFT VERTEBRAL ARTERY: Normal flow direction and waveform. IMPRESSION: 1. No evidence of residual/recurrent stenosis post bilateral carotid endarterectomy. 2.  Antegrade bilateral vertebral arterial flow. Electronically Signed   By: Lucrezia Europe M.D.   On: 08/15/2017 15:26    Assessment: 84 y.o. male presenting with complaint of diplopia.  MRI of the brain reviewed and shows an acute periaqueductal midbrain infarct.  Likely secondary to small vessel disease.  Carotid dopplers show no evidence of hemodynamically significant stenosis.  Echocardiogram shows no cardiac source of emboli with an EF of 55-65%.  A1c 8.0,  LDL 70 on statin.  Stroke Risk Factors - diabetes mellitus, hyperlipidemia and hypertension  Plan: 1. PT consult, OT consult, Speech consult 2. Prophylactic therapy-ASA 81m and Plavix 778mdaily to continue for the next month with change to Plavix alone after one month 3. NPO until RN stroke swallow screen 4. Telemetry monitoring 5. Frequent neuro checks 6. Blood sugar management with target A1c<7.0   LeAlexis GoodellMD Neurology [phone]/19/2019, 10:23 AM

## 2017-08-16 NOTE — Evaluation (Signed)
Physical Therapy Evaluation Patient Details Name: Ryan Mcclure MRN: [id number] DOB: 1931-04-09 Today's Date: 08/16/2017   History of Present Illness  Pt is an 84 year old male admitted for hemaniopsia, paralysis of conjugate gaze and CVA following c/o weakness and blurred vision.  No acute infarct on imaging.  PMH includes CKD III, CAD, Htn, CHF, chronic R PCA stroke and asthma.  Clinical Impression  Pt is an 84 year old male who lives alone in independent living at [facility].  He is indepdendent with use of SPC at baseline but states that he has noticed a decline in strength and mobility recently.  Pt A&O and follows commands consistently.  He has an eye patch on L eye which he states has helped with mitigation of diplopia.  Pt able to perform bed mobility Mod I and sit at EOB without assistance.  He presented with good strength and symmetry of bilateral UE and LE and reported no N/T.  Pt able to stand without physical assist though he was unsteady on feet and required VC's for proper use of RW.  Pt able to ambulate 25 ft in room more safely with RW or use of furniture when navigating small space in bathroom.  Performed toilet transfer with SBA.  Pt reported no pain throughout evaluation.  He will benefit from skilled PT with focus on balance and fall prevention, proper use of AD, tolerance to activity and HEP for strength.  He is appropriate for home health PT and will need supervision for OOB activity due to fall risk at this time.    Follow Up Recommendations Home health PT;Supervision for mobility/OOB    Equipment Recommendations  None recommended by PT    Recommendations for Other Services       Precautions / Restrictions Precautions Precautions: Fall Restrictions Weight Bearing Restrictions: No      Mobility  Bed Mobility Overal bed mobility: Modified Independent             General bed mobility comments: Increased time  Transfers Overall transfer level: Needs  assistance Equipment used: Rolling walker (2 wheeled) Transfers: Sit to/from Stand Sit to Stand: Supervision         General transfer comment: Education regarding safe use of RW, pt able to rise without physical assist.  Can control descent to chair.  Ambulation/Gait Ambulation/Gait assistance: Min guard   Assistive device: Rolling walker (2 wheeled)     Gait velocity interpretation: 1.31 - 2.62 ft/sec, indicative of limited community ambulator General Gait Details: Low foot clearance and decreased knee and hip flexion in swing phase.  Very unsteady on feet with lateral deviations which pt was able to self correct with AD.  PT discussed use of RW for safety and provided education concerning proper use of RW.  Stairs            Wheelchair Mobility    Modified Rankin (Stroke Patients Only)       Balance Overall balance assessment: Needs assistance Sitting-balance support: Feet supported;Single extremity supported Sitting balance-Leahy Scale: Good     Standing balance support: Single extremity supported Standing balance-Leahy Scale: Fair Standing balance comment: More stable with RW, pt able to hold furniture in bathroom with unilateral UE to walk to toilet.                             Pertinent Vitals/Pain Pain Assessment: No/denies pain    Home Living Family/patient expects to be  discharged to:: Private residence Living Arrangements: Alone Available Help at Discharge: Family;Available PRN/intermittently Type of Home: House Home Access: Level entry     Home Layout: One level Home Equipment: Cane - single point      Prior Function Level of Independence: Independent with assistive device(s)         Comments: Walks with SPC, limited community ambulation     Hand Dominance        Extremity/Trunk Assessment   Upper Extremity Assessment Upper Extremity Assessment: Overall WFL for tasks assessed(Grossly 4-/5 bilaterally, no sensation loss  noted.)    Lower Extremity Assessment Lower Extremity Assessment: Overall WFL for tasks assessed(Grossly 4/5 bilaterally, no sensation loss noted.)    Cervical / Trunk Assessment Cervical / Trunk Assessment: Kyphotic  Communication   Communication: No difficulties  Cognition Arousal/Alertness: Awake/alert Behavior During Therapy: WFL for tasks assessed/performed Overall Cognitive Status: Within Functional Limits for tasks assessed                                 General Comments: A&O x4.  follows commands consistently.      General Comments      Exercises     Assessment/Plan    PT Assessment Patient needs continued PT services  PT Problem List Decreased mobility;Decreased knowledge of precautions;Decreased balance       PT Treatment Interventions DME instruction;Therapeutic exercise;Gait training;Balance training;Stair training;Neuromuscular re-education;Functional mobility training;Therapeutic activities;Patient/family education    PT Goals (Current goals can be found in the Care Plan section)  Acute Rehab PT Goals Patient Stated Goal: To return to general daily function with use of SPC. PT Goal Formulation: With patient Time For Goal Achievement: 08/30/17 Potential to Achieve Goals: Good    Frequency Min 2X/week   Barriers to discharge        Co-evaluation               AM-PAC PT "6 Clicks" Daily Activity  Outcome Measure Difficulty turning over in bed (including adjusting bedclothes, sheets and blankets)?: None Difficulty moving from lying on back to sitting on the side of the bed? : A Little Difficulty sitting down on and standing up from a chair with arms (e.g., wheelchair, bedside commode, etc,.)?: A Little Help needed moving to and from a bed to chair (including a wheelchair)?: A Little Help needed walking in hospital room?: A Little Help needed climbing 3-5 steps with a railing? : A Little 6 Click Score: 19    End of Session  Equipment Utilized During Treatment: Gait belt Activity Tolerance: Patient tolerated treatment well Patient left: in chair;with chair alarm set;with call bell/phone within reach Nurse Communication: Mobility status PT Visit Diagnosis: Unsteadiness on feet (R26.81);Muscle weakness (generalized) (M62.81)    Time: 3338-3291 PT Time Calculation (min) (ACUTE ONLY): 27 min   Charges:   PT Evaluation $PT Eval Low Complexity: 1 Low PT Treatments $Therapeutic Activity: 8-22 mins   PT G Codes:   PT G-Codes **NOT FOR INPATIENT CLASS** Functional Assessment Tool Used: AM-PAC 6 Clicks Basic Mobility    Roxanne Gates, PT, DPT  Roxanne Gates 08/16/2017, 10:04 AM

## 2017-08-16 NOTE — Care Management (Signed)
Patient to discharge hone.  He and his wife are residents at [facility] - independent living.  Agency preference for home health is [facility].  Referral for RN Aide and PT accepted by [facility].

## 2017-08-16 NOTE — Progress Notes (Signed)
Inpatient Diabetes Program Recommendations  AACE/ADA: New Consensus Statement on Inpatient Glycemic Control (2019)  Target Ranges:  Prepandial:   less than 140 mg/dL      Peak postprandial:   less than 180 mg/dL (1-2 hours)      Critically ill patients:  140 - 180 mg/dL   Results for Ryan Mcclure, Ryan Mcclure (MRN [id number]) as of 08/16/2017 10:13  Ref. Range 08/15/2017 09:58 08/15/2017 21:49 08/15/2017 23:04 08/16/2017 08:12  Glucose-Capillary Latest Ref Range: 70 - 99 mg/dL 131 (H) 246 (H) 213 (H) 133 (H)   Review of Glycemic Control  Diabetes history: DM2 Outpatient Diabetes medications: Lantus 26 units QHS, Novolog 8 units with breakfast, 12 units with lunch, and 16 units with supper, Metformin 500 mg BID Current orders for Inpatient glycemic control: Lantus 18 units QHS, Novolog 0-9 units TID with meals, Novolog 0-5 units QHS  Inpatient Diabetes Program Recommendations: Insulin - Meal Coverage: Please consider ordering Novolog 3 units TID with meals for meal coverage if patient eats at least 50% of meals. HgbA1C: A1C 8% on 08/15/17 indicating an average glucose of 183 mg/dl over the past 2-3 months.  Thanks, Barnie Alderman, RN, MSN, CDE Diabetes Coordinator Inpatient Diabetes Program [phone] (Team Pager from 8am to 5pm)

## 2017-08-16 NOTE — Progress Notes (Signed)
Patient discharged home with spouse, verbalized understanding of education. Patient with no complaints.

## 2017-08-16 NOTE — Progress Notes (Signed)
PT Cancellation Note  Patient Details Name: SAMAN UMSTEAD MRN: [id number] DOB: [birth year]   Cancelled Treatment:    Reason Eval/Treat Not Completed: Patient declined, no reason specified.  Order received.  Chart reviewed.  Pt eating breakfast and nursing in room administering medication.  Will re-attempt later.   Roxanne Gates, PT, DPT 08/16/2017, 8:55 AM

## 2017-08-27 DIAGNOSIS — I2510 Atherosclerotic heart disease of native coronary artery without angina pectoris: Secondary | ICD-10-CM

## 2017-08-27 DIAGNOSIS — E1121 Type 2 diabetes mellitus with diabetic nephropathy: Secondary | ICD-10-CM

## 2017-08-27 DIAGNOSIS — I6300 Cerebral infarction due to thrombosis of unspecified precerebral artery: Secondary | ICD-10-CM | POA: Diagnosis not present

## 2017-08-27 DIAGNOSIS — K219 Gastro-esophageal reflux disease without esophagitis: Secondary | ICD-10-CM

## 2017-08-27 DIAGNOSIS — I10 Essential (primary) hypertension: Secondary | ICD-10-CM | POA: Diagnosis not present

## 2017-08-27 DIAGNOSIS — N183 Chronic kidney disease, stage 3 (moderate): Secondary | ICD-10-CM

## 2017-09-26 ENCOUNTER — Ambulatory Visit (INDEPENDENT_AMBULATORY_CARE_PROVIDER_SITE_OTHER): Payer: Medicare Other | Admitting: Podiatry

## 2017-09-26 ENCOUNTER — Encounter: Payer: Self-pay | Admitting: Podiatry

## 2017-09-26 DIAGNOSIS — B351 Tinea unguium: Secondary | ICD-10-CM

## 2017-09-26 DIAGNOSIS — IMO0002 Reserved for concepts with insufficient information to code with codable children: Secondary | ICD-10-CM | POA: Insufficient documentation

## 2017-09-26 DIAGNOSIS — E785 Hyperlipidemia, unspecified: Secondary | ICD-10-CM

## 2017-09-26 DIAGNOSIS — E1169 Type 2 diabetes mellitus with other specified complication: Secondary | ICD-10-CM | POA: Insufficient documentation

## 2017-09-26 DIAGNOSIS — E119 Type 2 diabetes mellitus without complications: Secondary | ICD-10-CM

## 2017-09-26 DIAGNOSIS — M79609 Pain in unspecified limb: Secondary | ICD-10-CM

## 2017-09-26 DIAGNOSIS — E1142 Type 2 diabetes mellitus with diabetic polyneuropathy: Secondary | ICD-10-CM

## 2017-09-26 NOTE — Progress Notes (Signed)
Complaint:  Visit Type: Patient returns to my office for continued preventative foot care services. Complaint: Patient states" my nails have grown long and thick and become painful to walk and wear shoes" Patient has been diagnosed with DM with no foot complications. The patient presents for preventative foot care services. No changes to ROS  Podiatric Exam: Vascular: dorsalis pedis and posterior tibial pulses are weakly palpable bilateral. Capillary return is immediate. Temperature gradient is WNL. Skin turgor WNL  Sensorium: Normal Semmes Weinstein monofilament test. Normal tactile sensation bilaterally. Nail Exam: Pt has thick disfigured discolored nails with subungual debris noted bilateral entire nail hallux through fifth toenails Ulcer Exam: There is no evidence of ulcer or pre-ulcerative changes or infection. Orthopedic Exam: Muscle tone and strength are WNL. No limitations in general ROM. No crepitus or effusions noted. Foot type and digits show no abnormalities. Bony prominences are unremarkable. Dorsal exostosis  B/l Skin: No Porokeratosis. No infection or ulcers  Diagnosis:  Onychomycosis, , Pain in right toe, pain in left toes  Treatment & Plan Procedures and Treatment: Consent by patient was obtained for treatment procedures. The patient understood the discussion of treatment and procedures well. All questions were answered thoroughly reviewed. Debridement of mycotic and hypertrophic toenails, 1 through 5 bilateral and clearing of subungual debris. No ulceration, no infection noted. ABN signed for 2019. Return Visit-Office Procedure: Patient instructed to return to the office for a follow up visit 3 months for continued evaluation and treatment.    Gardiner Barefoot DPM

## 2017-11-20 DIAGNOSIS — I63532 Cerebral infarction due to unspecified occlusion or stenosis of left posterior cerebral artery: Secondary | ICD-10-CM | POA: Insufficient documentation

## 2017-12-30 ENCOUNTER — Encounter: Payer: Self-pay | Admitting: Podiatry

## 2017-12-30 ENCOUNTER — Ambulatory Visit (INDEPENDENT_AMBULATORY_CARE_PROVIDER_SITE_OTHER): Payer: Medicare Other | Admitting: Podiatry

## 2017-12-30 DIAGNOSIS — D689 Coagulation defect, unspecified: Secondary | ICD-10-CM

## 2017-12-30 DIAGNOSIS — M79609 Pain in unspecified limb: Secondary | ICD-10-CM | POA: Diagnosis not present

## 2017-12-30 DIAGNOSIS — B351 Tinea unguium: Secondary | ICD-10-CM | POA: Diagnosis not present

## 2017-12-30 DIAGNOSIS — E1142 Type 2 diabetes mellitus with diabetic polyneuropathy: Secondary | ICD-10-CM

## 2017-12-30 NOTE — Progress Notes (Signed)
Complaint:  Visit Type: Patient returns to my office for continued preventative foot care services. Complaint: Patient states" my nails have grown long and thick and become painful to walk and wear shoes" Patient has been diagnosed with DM with no foot complications. The patient presents for preventative foot care services. No changes to ROS.  Patient is taking plavix.  Podiatric Exam: Vascular: dorsalis pedis and posterior tibial pulses are weakly palpable bilateral. Capillary return is immediate. Temperature gradient is WNL. Skin turgor WNL  Sensorium: Normal Semmes Weinstein monofilament test. Normal tactile sensation bilaterally. Nail Exam: Pt has thick disfigured discolored nails with subungual debris noted bilateral entire nail hallux through fifth toenails Ulcer Exam: There is no evidence of ulcer or pre-ulcerative changes or infection. Orthopedic Exam: Muscle tone and strength are WNL. No limitations in general ROM. No crepitus or effusions noted. Foot type and digits show no abnormalities. Bony prominences are unremarkable. Dorsal exostosis  B/l Skin: No Porokeratosis. No infection or ulcers  Diagnosis:  Onychomycosis, , Pain in right toe, pain in left toes  Treatment & Plan Procedures and Treatment: Consent by patient was obtained for treatment procedures. The patient understood the discussion of treatment and procedures well. All questions were answered thoroughly reviewed. Debridement of mycotic and hypertrophic toenails, 1 through 5 bilateral and clearing of subungual debris. No ulceration, no infection noted. ABN signed for 2019. Return Visit-Office Procedure: Patient instructed to return to the office for a follow up visit 3 months for continued evaluation and treatment.    Gardiner Barefoot DPM

## 2018-02-04 ENCOUNTER — Other Ambulatory Visit (INDEPENDENT_AMBULATORY_CARE_PROVIDER_SITE_OTHER): Payer: Self-pay | Admitting: Vascular Surgery

## 2018-02-04 DIAGNOSIS — Z9889 Other specified postprocedural states: Secondary | ICD-10-CM

## 2018-02-07 ENCOUNTER — Ambulatory Visit (INDEPENDENT_AMBULATORY_CARE_PROVIDER_SITE_OTHER): Payer: Medicare Other

## 2018-02-07 ENCOUNTER — Encounter (INDEPENDENT_AMBULATORY_CARE_PROVIDER_SITE_OTHER): Payer: Self-pay | Admitting: Vascular Surgery

## 2018-02-07 ENCOUNTER — Ambulatory Visit (INDEPENDENT_AMBULATORY_CARE_PROVIDER_SITE_OTHER): Payer: Medicare Other | Admitting: Vascular Surgery

## 2018-02-07 VITALS — BP 183/73 | HR 59 | Resp 16 | Ht 67.0 in | Wt 209.6 lb

## 2018-02-07 DIAGNOSIS — I6523 Occlusion and stenosis of bilateral carotid arteries: Secondary | ICD-10-CM

## 2018-02-07 DIAGNOSIS — Z9889 Other specified postprocedural states: Secondary | ICD-10-CM | POA: Diagnosis not present

## 2018-02-07 DIAGNOSIS — E119 Type 2 diabetes mellitus without complications: Secondary | ICD-10-CM

## 2018-02-07 DIAGNOSIS — Z794 Long term (current) use of insulin: Secondary | ICD-10-CM

## 2018-02-07 DIAGNOSIS — E782 Mixed hyperlipidemia: Secondary | ICD-10-CM | POA: Diagnosis not present

## 2018-02-07 DIAGNOSIS — I10 Essential (primary) hypertension: Secondary | ICD-10-CM | POA: Diagnosis not present

## 2018-02-07 NOTE — Progress Notes (Signed)
MRN : [id number]  Ryan Mcclure is a 85 y.o. (1931-03-27) male who presents with chief complaint of No chief complaint on file. Marland Kitchen  History of Present Illness: Patient returns in follow-up of his carotid disease.  He is now over 5 years status post staged bilateral carotid endarterectomies.  He is doing well.  He has no specific complaints today.  His carotid endarterectomy sites are widely patent on duplex without significant recurrent stenosis.  Current Outpatient Medications  Medication Sig Dispense Refill  . atorvastatin (LIPITOR) 40 MG tablet Take 40 mg by mouth daily.    . blood glucose meter kit and supplies KIT Dispense based on patient and insurance preference. Use up to four times daily as directed. (FOR ICD-9 250.00, 250.01). 1 each 0  . Blood Glucose Monitoring Suppl (FIFTY50 GLUCOSE METER 2.0) w/Device KIT Use as directed    . clopidogrel (PLAVIX) 75 MG tablet Take by mouth.    . Cyanocobalamin (RA VITAMIN B-12 TR) 1000 MCG TBCR Take 1 tablet by mouth daily.     Water engineer Bandages & Supports (MEDICAL COMPRESSION STOCKINGS) MISC Please provide 58mHg compression stockings 1 each 0  . ferrous sulfate 325 (65 FE) MG tablet TAKE 1 TABLET BY MOUTH THREE TIMES DAILY WITH MEALS    . fluticasone (FLONASE) 50 MCG/ACT nasal spray Place 1 spray into both nostrils daily as needed for allergies.     . furosemide (LASIX) 20 MG tablet Take 40 mg by mouth 2 (two) times daily.  10  . glucose blood (CONTOUR NEXT TEST) test strip TEST FOUR TIMES DAILY    . insulin aspart (NOVOLOG) 100 UNIT/ML FlexPen Take 8 units with breakfast, 12 units with lunch, 16 units with supper. MDD 50 units.    . Insulin Glargine (LANTUS SOLOSTAR) 100 UNIT/ML Solostar Pen Inject 24 units nightly based on blood sugar. Fill for basaglar.    . Lancets MISC Use 1 each 4 (four) times daily Use as instructed.    .Marland Kitchenlisinopril (PRINIVIL,ZESTRIL) 5 MG tablet Take by mouth.    . metFORMIN (GLUCOPHAGE) 500 MG tablet Take 500 mg  by mouth 2 (two) times daily.  2  . metoprolol tartrate (LOPRESSOR) 50 MG tablet TAKE 1 TABLET BY MOUTH TWICE DAILY    . nitroGLYCERIN (NITROSTAT) 0.4 MG SL tablet take 1 tablet under the tongue every 5 minutes if needed for chest pain    . omeprazole (PRILOSEC) 40 MG capsule Take 40 mg by mouth 2 (two) times daily.     .Marland KitchenRA ASPIRIN EC ADULT LOW ST 81 MG EC tablet Take 1 tablet (81 mg total) by mouth daily. 30 tablet 0   No current facility-administered medications for this visit.     Past Medical History:  Diagnosis Date  . Chronic systolic CHF (congestive heart failure) ([facility]   . CKD (chronic kidney disease), stage III ([facility]   . Diabetes ([place]   . HBP (high blood pressure)   . Heart attack [place]   . High cholesterol   . Stroke (cerebrum) ([facility]   . Swelling     Past Surgical History:  Procedure Laterality Date  . COLONOSCOPY N/A 10/21/2015   Procedure: COLONOSCOPY;  Surgeon: DMilus Banister MD;  Location: [facility];  Service: Endoscopy;  Laterality: N/A;  . EXPLORATION POST OPERATIVE OPEN HEART    . EYE SURGERY Bilateral     Social History Social History   Tobacco Use  . Smoking status: Former Smoker    Types: Cigarettes  Last attempt to quit: 01/30/1995    Years since quitting: 23.0  . Smokeless tobacco: Never Used  Substance Use Topics  . Alcohol use: No  . Drug use: No    Family History Family History  Problem Relation Age of Onset  . Diabetes Mellitus I Mother   . Kidney failure Mother   . Alcoholism Father   . Heart attack Sister   . Prostate cancer Brother     Allergies  Allergen Reactions  . Neuromuscular Blocking Agents Nausea And Vomiting and Other (See Comments)    Tongue swelling, lips swelling   . Other Other (See Comments)    Tongue/lips swelling with steroid dose pack, pt does not recall exact name.   . Amlodipine Nausea And Vomiting   REVIEW OF SYSTEMS (Negative unless checked)  Constitutional: [] ?Weight loss  [] ?Fever   [] ?Chills Cardiac: [] ?Chest pain   [] ?Chest pressure   [] ?Palpitations   [] ?Shortness of breath when laying flat   [] ?Shortness of breath at rest   [] ?Shortness of breath with exertion. Vascular:  [] ?Pain in legs with walking   [] ?Pain in legs at rest   [] ?Pain in legs when laying flat   [] ?Claudication   [] ?Pain in feet when walking  [] ?Pain in feet at rest  [] ?Pain in feet when laying flat   [] ?History of DVT   [] ?Phlebitis   [] ?Swelling in legs   [] ?Varicose veins   [] ?Non-healing ulcers Pulmonary:   [] ?Uses home oxygen   [] ?Productive cough   [] ?Hemoptysis   [] ?Wheeze  [] ?COPD   [] ?Asthma Neurologic:  [] ?Dizziness  [] ?Blackouts   [] ?Seizures   [x] ?History of stroke   [] ?History of TIA  [] ?Aphasia   [] ?Temporary blindness   [] ?Dysphagia   [] ?Weakness or numbness in arms   [] ?Weakness or numbness in legs Musculoskeletal:  [x] ?Arthritis   [] ?Joint swelling   [] ?Joint pain   [] ?Low back pain Hematologic:  [] ?Easy bruising  [] ?Easy bleeding   [] ?Hypercoagulable state   [] ?Anemic  [] ?Hepatitis Gastrointestinal:  [] ?Blood in stool   [] ?Vomiting blood  [] ?Gastroesophageal reflux/heartburn   [] ?Difficulty swallowing. Genitourinary:  [x] ?Chronic kidney disease   [] ?Difficult urination  [] ?Frequent urination  [] ?Burning with urination   [] ?Blood in urine Skin:  [] ?Rashes   [] ?Ulcers   [] ?Wounds Psychological:  [] ?History of anxiety   [] ? History of major depression.    Physical Examination  There were no vitals filed for this visit. There is no height or weight on file to calculate BMI. Gen:  WD/WN, NAD Head: [place]/AT, No temporalis wasting. Ear/Nose/Throat: Hearing grossly intact, nares w/o erythema or drainage, trachea midline Eyes: Conjunctiva clear. Sclera non-icteric Neck: Supple.  No bruit  Pulmonary:  Good air movement, equal and clear to auscultation bilaterally.  Cardiac: RRR, No JVD Vascular:  Vessel Right Left  Radial Palpable Palpable                                     Musculoskeletal: M/S 5/5 throughout.  No deformity or atrophy. No edema. Neurologic: CN 2-12 intact. Sensation grossly intact in extremities.  Symmetrical.  Speech is fluent. Motor exam as listed above. Psychiatric: Judgment intact, Mood & affect appropriate for pt's clinical situation. Dermatologic: No rashes or ulcers noted.  No cellulitis or open wounds. Lymph : No Cervical, Axillary, or Inguinal lymphadenopathy.     CBC Lab Results  Component Value Date   WBC 8.0 08/16/2017   HGB 10.8 (L) 08/16/2017  HCT 33.5 (L) 08/16/2017   MCV 83.6 08/16/2017   PLT 140 (L) 08/16/2017    BMET    Component Value Date/Time   NA 143 08/16/2017 0352   NA 142 07/11/2012 1948   K 4.5 08/16/2017 0352   K 4.1 07/11/2012 1948   CL 109 08/16/2017 0352   CL 107 07/11/2012 1948   CO2 29 08/16/2017 0352   CO2 27 07/11/2012 1948   GLUCOSE 153 (H) 08/16/2017 0352   GLUCOSE 154 (H) 07/11/2012 1948   BUN 27 (H) 08/16/2017 0352   BUN 32 (H) 07/11/2012 1948   CREATININE 1.54 (H) 08/16/2017 0352   CREATININE 2.02 (H) 07/11/2012 1948   CALCIUM 8.9 08/16/2017 0352   CALCIUM 8.7 07/11/2012 1948   GFRNONAA 40 (L) 08/16/2017 0352   GFRNONAA 31 (L) 07/11/2012 1948   GFRAA 46 (L) 08/16/2017 0352   GFRAA 36 (L) 07/11/2012 1948   CrCl cannot be calculated (Patient's most recent lab result is older than the maximum 21 days allowed.).  COAG Lab Results  Component Value Date   INR 1.06 10/18/2015   INR 1.3 02/22/2011    Radiology No results found.   Assessment/Plan Controlled type 2 diabetes mellitus without complication (HCC) blood glucose control important in reducing the progression of atherosclerotic disease. Also, involved in wound healing. On appropriate medications.   Combined fat and carbohydrate induced hyperlipemia lipid control important in reducing the progression of atherosclerotic disease. Continue statin therapy   Benign essential HTN blood pressure control important  in reducing the progression of atherosclerotic disease. On appropriate oral medications.   Carotid stenosis The patient is almost 10 years status post bilateral carotid endarterectomies for high-grade stenosis. He is doing well. His carotid duplex today demonstrates widely patent carotid endarterectomy sites bilaterally without recurrent stenosis. He has no current issues. He will continue his aspirin and statin agent. We will plan to recheck him in 1 year.    Leotis Pain, MD  02/07/2018 1:02 PM    This note was created with Dragon medical transcription system.  Any errors from dictation are purely unintentional

## 2018-03-27 ENCOUNTER — Other Ambulatory Visit: Payer: Self-pay | Admitting: Nephrology

## 2018-03-27 DIAGNOSIS — N183 Chronic kidney disease, stage 3 unspecified: Secondary | ICD-10-CM

## 2018-03-31 ENCOUNTER — Encounter: Payer: Self-pay | Admitting: Podiatry

## 2018-03-31 ENCOUNTER — Ambulatory Visit (INDEPENDENT_AMBULATORY_CARE_PROVIDER_SITE_OTHER): Payer: Medicare Other | Admitting: Podiatry

## 2018-03-31 DIAGNOSIS — B351 Tinea unguium: Secondary | ICD-10-CM

## 2018-03-31 DIAGNOSIS — D689 Coagulation defect, unspecified: Secondary | ICD-10-CM

## 2018-03-31 DIAGNOSIS — E1142 Type 2 diabetes mellitus with diabetic polyneuropathy: Secondary | ICD-10-CM

## 2018-03-31 DIAGNOSIS — M79609 Pain in unspecified limb: Principal | ICD-10-CM

## 2018-03-31 DIAGNOSIS — M79676 Pain in unspecified toe(s): Secondary | ICD-10-CM | POA: Diagnosis not present

## 2018-03-31 NOTE — Progress Notes (Addendum)
Complaint:  Visit Type: Patient returns to my office for continued preventative foot care services. Complaint: Patient states" my nails have grown long and thick and become painful to walk and wear shoes" Patient has been diagnosed with DM with no foot complications. The patient presents for preventative foot care services. No changes to ROS.  Patient is taking plavix. Patient presents to the office with his sister.  Podiatric Exam: Vascular: dorsalis pedis and posterior tibial pulses are weakly palpable bilateral. Capillary return is immediate. Temperature gradient is WNL. Skin turgor WNL  Sensorium: Normal Semmes Weinstein monofilament test. Normal tactile sensation bilaterally. Nail Exam: Pt has thick disfigured discolored nails with subungual debris noted bilateral entire nail hallux through fifth toenails Ulcer Exam: There is no evidence of ulcer or pre-ulcerative changes or infection. Orthopedic Exam: Muscle tone and strength are WNL. No limitations in general ROM. No crepitus or effusions noted. Foot type and digits show no abnormalities. Bony prominences are unremarkable. Dorsal exostosis  B/l Skin: No Porokeratosis. No infection or ulcers  Diagnosis:  Onychomycosis, , Pain in right toe, pain in left toes  Treatment & Plan Procedures and Treatment: Consent by patient was obtained for treatment procedures. The patient understood the discussion of treatment and procedures well. All questions were answered thoroughly reviewed. Debridement of mycotic and hypertrophic toenails, 1 through 5 bilateral and clearing of subungual debris. No ulceration, no infection noted.  Return Visit-Office Procedure: Patient instructed to return to the office for a follow up visit 3 months for continued evaluation and treatment.    Gardiner Barefoot DPM

## 2018-04-01 ENCOUNTER — Ambulatory Visit: Payer: Medicare Other

## 2018-04-09 ENCOUNTER — Ambulatory Visit: Payer: Medicare Other

## 2018-04-11 ENCOUNTER — Other Ambulatory Visit: Payer: Self-pay

## 2018-04-11 ENCOUNTER — Ambulatory Visit
Admission: RE | Admit: 2018-04-11 | Discharge: 2018-04-11 | Disposition: A | Discharge status: Home / Self Care | Payer: Medicare Other | Source: Ambulatory Visit | Attending: Nephrology | Admitting: Nephrology

## 2018-04-11 DIAGNOSIS — N183 Chronic kidney disease, stage 3 unspecified: Secondary | ICD-10-CM

## 2018-06-30 ENCOUNTER — Ambulatory Visit (INDEPENDENT_AMBULATORY_CARE_PROVIDER_SITE_OTHER): Payer: Medicare Other | Admitting: Podiatry

## 2018-06-30 ENCOUNTER — Encounter: Payer: Self-pay | Admitting: Podiatry

## 2018-06-30 ENCOUNTER — Other Ambulatory Visit: Payer: Self-pay

## 2018-06-30 VITALS — Temp 97.5°F

## 2018-06-30 DIAGNOSIS — M79609 Pain in unspecified limb: Secondary | ICD-10-CM

## 2018-06-30 DIAGNOSIS — E1142 Type 2 diabetes mellitus with diabetic polyneuropathy: Secondary | ICD-10-CM | POA: Diagnosis not present

## 2018-06-30 DIAGNOSIS — D689 Coagulation defect, unspecified: Secondary | ICD-10-CM

## 2018-06-30 DIAGNOSIS — B351 Tinea unguium: Secondary | ICD-10-CM | POA: Diagnosis not present

## 2018-06-30 NOTE — Progress Notes (Signed)
Complaint:  Visit Type: Patient returns to my office for continued preventative foot care services. Complaint: Patient states" my nails have grown long and thick and become painful to walk and wear shoes" Patient has been diagnosed with DM with no foot complications. The patient presents for preventative foot care services. No changes to ROS.  Patient is taking plavix. Patient presents to the office with his sister.  Podiatric Exam: Vascular: dorsalis pedis and posterior tibial pulses are weakly palpable bilateral. Capillary return is immediate. Temperature gradient is WNL. Skin turgor WNL  Sensorium: Normal Semmes Weinstein monofilament test. Normal tactile sensation bilaterally. Nail Exam: Pt has thick disfigured discolored nails with subungual debris noted bilateral entire nail hallux through fifth toenails Ulcer Exam: There is no evidence of ulcer or pre-ulcerative changes or infection. Orthopedic Exam: Muscle tone and strength are WNL. No limitations in general ROM. No crepitus or effusions noted. Foot type and digits show no abnormalities. Bony prominences are unremarkable. Dorsal exostosis  B/l Skin: No Porokeratosis. No infection or ulcers  Diagnosis:  Onychomycosis, , Pain in right toe, pain in left toes  Treatment & Plan Procedures and Treatment: Consent by patient was obtained for treatment procedures. The patient understood the discussion of treatment and procedures well. All questions were answered thoroughly reviewed. Debridement of mycotic and hypertrophic toenails, 1 through 5 bilateral and clearing of subungual debris. No ulceration, no infection noted.  Return Visit-Office Procedure: Patient instructed to return to the office for a follow up visit 3 months for continued evaluation and treatment.    Gardiner Barefoot DPM

## 2018-09-29 ENCOUNTER — Ambulatory Visit: Payer: Medicare Other | Admitting: Podiatry

## 2018-10-09 ENCOUNTER — Encounter: Payer: Self-pay | Admitting: Emergency Medicine

## 2018-10-09 ENCOUNTER — Other Ambulatory Visit: Payer: Self-pay

## 2018-10-09 ENCOUNTER — Emergency Department
Admission: EM | Admit: 2018-10-09 | Discharge: 2018-10-09 | Disposition: A | Discharge status: Home / Self Care | Payer: Medicare Other | Attending: Emergency Medicine | Admitting: Emergency Medicine

## 2018-10-09 ENCOUNTER — Emergency Department: Payer: Medicare Other

## 2018-10-09 DIAGNOSIS — Z79899 Other long term (current) drug therapy: Secondary | ICD-10-CM | POA: Diagnosis not present

## 2018-10-09 DIAGNOSIS — I130 Hypertensive heart and chronic kidney disease with heart failure and stage 1 through stage 4 chronic kidney disease, or unspecified chronic kidney disease: Secondary | ICD-10-CM | POA: Insufficient documentation

## 2018-10-09 DIAGNOSIS — W101XXA Fall (on)(from) sidewalk curb, initial encounter: Secondary | ICD-10-CM | POA: Insufficient documentation

## 2018-10-09 DIAGNOSIS — Y939 Activity, unspecified: Secondary | ICD-10-CM | POA: Diagnosis not present

## 2018-10-09 DIAGNOSIS — Y92009 Unspecified place in unspecified non-institutional (private) residence as the place of occurrence of the external cause: Secondary | ICD-10-CM

## 2018-10-09 DIAGNOSIS — N183 Chronic kidney disease, stage 3 (moderate): Secondary | ICD-10-CM | POA: Diagnosis not present

## 2018-10-09 DIAGNOSIS — I509 Heart failure, unspecified: Secondary | ICD-10-CM | POA: Insufficient documentation

## 2018-10-09 DIAGNOSIS — Z7982 Long term (current) use of aspirin: Secondary | ICD-10-CM | POA: Insufficient documentation

## 2018-10-09 DIAGNOSIS — Y999 Unspecified external cause status: Secondary | ICD-10-CM | POA: Diagnosis not present

## 2018-10-09 DIAGNOSIS — Z8673 Personal history of transient ischemic attack (TIA), and cerebral infarction without residual deficits: Secondary | ICD-10-CM | POA: Diagnosis not present

## 2018-10-09 DIAGNOSIS — Y92480 Sidewalk as the place of occurrence of the external cause: Secondary | ICD-10-CM | POA: Insufficient documentation

## 2018-10-09 DIAGNOSIS — Z87891 Personal history of nicotine dependence: Secondary | ICD-10-CM | POA: Insufficient documentation

## 2018-10-09 DIAGNOSIS — Z7901 Long term (current) use of anticoagulants: Secondary | ICD-10-CM | POA: Insufficient documentation

## 2018-10-09 DIAGNOSIS — S0083XA Contusion of other part of head, initial encounter: Secondary | ICD-10-CM

## 2018-10-09 DIAGNOSIS — E1122 Type 2 diabetes mellitus with diabetic chronic kidney disease: Secondary | ICD-10-CM | POA: Insufficient documentation

## 2018-10-09 DIAGNOSIS — Z794 Long term (current) use of insulin: Secondary | ICD-10-CM | POA: Diagnosis not present

## 2018-10-09 DIAGNOSIS — S0990XA Unspecified injury of head, initial encounter: Secondary | ICD-10-CM | POA: Diagnosis present

## 2018-10-09 DIAGNOSIS — S80211A Abrasion, right knee, initial encounter: Secondary | ICD-10-CM | POA: Insufficient documentation

## 2018-10-09 DIAGNOSIS — W19XXXA Unspecified fall, initial encounter: Secondary | ICD-10-CM

## 2018-10-09 MED ORDER — ACETAMINOPHEN 325 MG PO TABS
650.0000 mg | ORAL_TABLET | Freq: Once | ORAL | Status: AC
  Administered 2018-10-09: 21:00:00 650 mg via ORAL
  Filled 2018-10-09: qty 2

## 2018-10-09 MED ORDER — BACITRACIN-NEOMYCIN-POLYMYXIN 400-5-5000 EX OINT
TOPICAL_OINTMENT | Freq: Once | CUTANEOUS | Status: AC
  Administered 2018-10-09: 1 via TOPICAL
  Filled 2018-10-09: qty 1

## 2018-10-09 NOTE — ED Triage Notes (Signed)
Pt in via EMS from [facility].  Pt reports mechanical fall outside, does report hitting head, denies LOC, denies blood thinners.  Abrasion noted to right knee.  Denies any complaints at this time.

## 2018-10-09 NOTE — ED Notes (Signed)
Patient transported to CT 

## 2018-10-09 NOTE — ED Notes (Signed)
PT denies LOC or pain anywhere other than knee

## 2018-10-09 NOTE — Discharge Instructions (Signed)
Your exam, CT, and XR are essentially normal at this time. You should take OTC Tylenol as needed for pain. Keep the knee abrasion clean with soap & water and apply a small amount of antibiotic ointment to the wound. See your provider for ongoing symptoms.

## 2018-10-09 NOTE — ED Provider Notes (Signed)
[facility] Emergency Department Provider Note ____________________________________________  Time seen: 2027  I have reviewed the triage vital signs and the nursing notes.  HISTORY  Chief Complaint  Fall  HPI Ryan Mcclure is a 85 y.o. male resents to the ED via EMS, from his home at [facility] independent living.  Patient reports a mechanical fall as he went outside to check on his car.  He describes missing the curb as he turned to come back into the house, he landed hitting his right knee on the curb and face planted into the grass.  He denies any loss of consciousness, nausea, vomiting, or dizziness.  He denies any headache, neck pain, chest pain, shortness of breath.  His primary complaint is tenderness to the right knee as well as an abrasion to the right knee.   Past Medical History:  Diagnosis Date  . Chronic systolic CHF (congestive heart failure) ([place])   . CKD (chronic kidney disease), stage III ([place])   . Diabetes ([place])   . HBP (high blood pressure)   . Heart attack ([place])   . High cholesterol   . Stroke (cerebrum) ([place])   . Swelling     Patient Active Problem List   Diagnosis Date Noted  . Cerebrovascular accident (CVA) due to occlusion of left posterior cerebral artery ([place]) 11/20/2017  . Hyperlipidemia due to type 2 diabetes mellitus ([place]) 09/26/2017  . Ulcer 09/26/2017  . Stroke ([place]) 08/15/2017  . Carotid stenosis 01/31/2016  . Hematochezia   . Diverticulosis of large intestine without hemorrhage   . Hemorrhoid   . GIB (gastrointestinal bleeding) 10/18/2015  . Blood loss anemia 10/18/2015  . CKD (chronic kidney disease), stage III ([place])   . Chronic systolic CHF (congestive heart failure) ([place])   . Stroke (cerebrum) ([place])   . Cardiomyopathy, ischemic 10/12/2015  . Shortness of breath 09/14/2015  . Absolute anemia 06/10/2015  . Cataract cortical, senile 06/10/2015  . Controlled type 2 diabetes mellitus without complication ([place])  96/04/5407  . Acid reflux 06/10/2015  . Combined fat and carbohydrate induced hyperlipemia 06/10/2015  . Cerebrovascular accident (CVA) ([place]) 06/10/2015  . Non-pressure chronic ulcer of skin of other sites with unspecified severity ([place]) 06/10/2015  . Benign essential HTN 08/25/2014  . Hypertension associated with diabetes ([place]) 08/25/2014  . Arteriosclerosis of coronary artery 09/09/2013  . Microalbuminuria 09/08/2013  . Diabetes mellitus ([place]) 09/08/2013    Past Surgical History:  Procedure Laterality Date  . COLONOSCOPY N/A 10/21/2015   Procedure: COLONOSCOPY;  Surgeon: Milus Banister, MD;  Location: [facility] ENDOSCOPY;  Service: Endoscopy;  Laterality: N/A;  . EXPLORATION POST OPERATIVE OPEN HEART    . EYE SURGERY Bilateral     Prior to Admission medications   Medication Sig Start Date End Date Taking? Authorizing Provider  atorvastatin (LIPITOR) 40 MG tablet Take 40 mg by mouth daily.    [provider]  blood glucose meter kit and supplies KIT Dispense based on patient and insurance preference. Use up to four times daily as directed. (FOR ICD-9 250.00, 250.01). 06/21/17   Harvest Dark, MD  Blood Glucose Monitoring Suppl (FIFTY50 GLUCOSE METER 2.0) w/Device KIT Use as directed 10/18/17 10/18/18  [provider]  clopidogrel (PLAVIX) 75 MG tablet Take by mouth. 11/11/17   [provider]  Cyanocobalamin (RA VITAMIN B-12 TR) 1000 MCG TBCR Take 1 tablet by mouth daily.     [provider]  Elastic Bandages & Supports (MEDICAL COMPRESSION STOCKINGS) MISC Please provide 63mHg  compression stockings 06/21/17   Harvest Dark, MD  ferrous sulfate 325 (65 FE) MG tablet TAKE 1 TABLET BY MOUTH THREE TIMES DAILY WITH MEALS 12/23/17   [provider]  fluticasone (FLONASE) 50 MCG/ACT nasal spray Place 1 spray into both nostrils daily as needed for allergies.  06/03/13   [provider]  furosemide (LASIX) 20 MG tablet Take 40 mg by mouth 2  (two) times daily. 10/11/17   [provider]  glucose blood (CONTOUR NEXT TEST) test strip TEST FOUR TIMES DAILY 12/02/17   [provider]  insulin aspart (NOVOLOG) 100 UNIT/ML FlexPen Take 8 units with breakfast, 12 units with lunch, 16 units with supper. MDD 50 units. 10/03/17   [provider]  Insulin Glargine (BASAGLAR KWIKPEN) 100 UNIT/ML SOPN Inject 24 Units into the skin daily.    [provider]  Lancets MISC Use 1 each 4 (four) times daily Use as instructed. 10/18/17 10/18/18  [provider]  lisinopril (PRINIVIL,ZESTRIL) 5 MG tablet Take by mouth. 10/16/17   [provider]  lisinopril (ZESTRIL) 10 MG tablet TK 1 T PO ONCE D 04/12/18   [provider]  metFORMIN (GLUCOPHAGE) 500 MG tablet Take 500 mg by mouth 2 (two) times daily. 07/23/17   [provider]  metoprolol tartrate (LOPRESSOR) 50 MG tablet TAKE 1 TABLET BY MOUTH TWICE DAILY 11/18/17   [provider]  nitroGLYCERIN (NITROSTAT) 0.4 MG SL tablet take 1 tablet under the tongue every 5 minutes if needed for chest pain 10/29/14   [provider]  omeprazole (PRILOSEC) 40 MG capsule Take 40 mg by mouth 2 (two) times daily.     [provider]  RA ASPIRIN EC ADULT LOW ST 81 MG EC tablet Take 1 tablet (81 mg total) by mouth daily. 11/02/15   Kinnie Feil, MD    Allergies Neuromuscular blocking agents, Other, and Amlodipine  Family History  Problem Relation Age of Onset  . Diabetes Mellitus I Mother   . Kidney failure Mother   . Alcoholism Father   . Heart attack Sister   . Prostate cancer Brother     Social History Social History   Tobacco Use  . Smoking status: Former Smoker    Types: Cigarettes    Quit date: 01/30/1995    Years since quitting: 23.7  . Smokeless tobacco: Never Used  Substance Use Topics  . Alcohol use: No  . Drug use: No    Review of Systems  Constitutional: Negative for fever. Eyes: Negative for  visual changes. ENT: Negative for sore throat. Cardiovascular: Negative for chest pain. Respiratory: Negative for shortness of breath. Gastrointestinal: Negative for abdominal pain, vomiting and diarrhea. Genitourinary: Negative for dysuria. Musculoskeletal: Negative for back pain. Skin: Negative for rash. Right knee abrasion Neurological: Negative for headaches, focal weakness or numbness. ____________________________________________  PHYSICAL EXAM:  VITAL SIGNS: ED Triage Vitals [10/09/18 1922]  Enc Vitals Group     BP (!) 148/63     Pulse Rate 73     Resp 16     Temp 98.5 F (36.9 C)     Temp Source Oral     SpO2 97 %     Weight 200 lb (90.7 kg)     Height 5' 7"  (1.702 m)     Head Circumference      Peak Flow      Pain Score 0     Pain Loc      Pain Edu?  Excl. in [place]?     Constitutional: Alert and oriented. Well appearing and in no distress. GCS=15 Head: Normocephalic and atraumatic. Mild edema and erythema over the forehead and right temple.  Eyes: Conjunctivae are normal. PERRL. Normal extraocular movements Ears: Canals clear. TMs intact bilaterally. Nose: No congestion/rhinorrhea/epistaxis. Mouth/Throat: Mucous membranes are moist. Neck: Supple. Normal ROM. No midline tenderness.  Cardiovascular: Normal rate, regular rhythm. Normal distal pulses. Respiratory: Normal respiratory effort. No wheezes/rales/rhonchi. Gastrointestinal: Soft and nontender. No distention. Musculoskeletal: Right knee without obvious deformity or dislocation.  Patient with normal range of motion to the right knee without crepitus.  He has an anterior abrasion to the kneecap but no other signs of effusion or edema.  Nontender with normal range of motion in all extremities.  Neurologic: Normal speech and language. No gross focal neurologic deficits are appreciated. Skin:  Skin is warm, dry and intact. No rash noted. ____________________________________________   RADIOLOGY  CT Head w/o  CM IMPRESSION: 1. No acute intracranial pathology. 2. Encephalomalacia of the right medial occipital lobe. 3. Findings consistent with age related atrophy and chronic small vessel ischemia 4. Chronic sinusitis of the left maxillary sinus  DG Right Knee  negative ____________________________________________  PROCEDURES  Tylenol 650 mg PO Neomycin ointment + Dressing  Procedures ____________________________________________  INITIAL IMPRESSION / ASSESSMENT AND PLAN / ED COURSE  Geriatric patient with ED evaluation injury sustained following a mechanical fall.  Patient's exam is overall benign and reassuring at this time.  His CT scan is negative for any acute intracranial process.  X-ray of the knee does not reveal any acute fracture or dislocation.  Patient's clinical diagnosis is facial contusion and knee abrasion following mechanical fall.  His wounds are dressed as appropriate, and is discharged to follow-up with his primary provider for ongoing symptoms peer return precautions have been reviewed.  Ryan Mcclure was evaluated in Emergency Department on 10/09/2018 for the symptoms described in the history of present illness. He was evaluated in the context of the global COVID-19 pandemic, which necessitated consideration that the patient might be at risk for infection with the SARS-CoV-2 virus that causes COVID-19. Institutional protocols and algorithms that pertain to the evaluation of patients at risk for COVID-19 are in a state of rapid change based on information released by regulatory bodies including the CDC and federal and state organizations. These policies and algorithms were followed during the patient's care in the ED. ____________________________________________  FINAL CLINICAL IMPRESSION(S) / ED DIAGNOSES  Final diagnoses:  Fall in home, initial encounter  Knee abrasion, right, initial encounter  Facial contusion, initial encounter      Melvenia Needles,  PA-C 10/09/18 2310    Carrie Mew, MD 10/09/18 2341

## 2018-10-09 NOTE — ED Notes (Signed)
This Rn has attempted to contact pt's sister multiple times but her phone is off. Spoke with [facility] who states they will not come get the pt and that he needs to take an ambulance back.

## 2019-01-29 ENCOUNTER — Ambulatory Visit (INDEPENDENT_AMBULATORY_CARE_PROVIDER_SITE_OTHER): Payer: Medicare Other | Admitting: Podiatry

## 2019-01-29 ENCOUNTER — Other Ambulatory Visit: Payer: Self-pay

## 2019-01-29 ENCOUNTER — Encounter: Payer: Self-pay | Admitting: Podiatry

## 2019-01-29 DIAGNOSIS — B351 Tinea unguium: Secondary | ICD-10-CM

## 2019-01-29 DIAGNOSIS — M79676 Pain in unspecified toe(s): Secondary | ICD-10-CM

## 2019-01-29 DIAGNOSIS — E1142 Type 2 diabetes mellitus with diabetic polyneuropathy: Secondary | ICD-10-CM | POA: Diagnosis not present

## 2019-01-29 DIAGNOSIS — D689 Coagulation defect, unspecified: Secondary | ICD-10-CM | POA: Diagnosis not present

## 2019-01-29 NOTE — Progress Notes (Signed)
Complaint:  Visit Type: Patient returns to my office for continued preventative foot care services. Complaint: Patient states" my nails have grown long and thick and become painful to walk and wear shoes" Patient has been diagnosed with DM with no foot complications. The patient presents for preventative foot care services. No changes to ROS.  Patient is taking plavix. Patient presents to the office with his sister.  Podiatric Exam: Vascular: dorsalis pedis and posterior tibial pulses are weakly palpable bilateral. Capillary return is immediate. Temperature gradient is WNL. Skin turgor WNL  Sensorium: Normal Semmes Weinstein monofilament test. Normal tactile sensation bilaterally. Nail Exam: Pt has thick disfigured discolored nails with subungual debris noted bilateral entire nail hallux through fifth toenails Ulcer Exam: There is no evidence of ulcer or pre-ulcerative changes or infection. Orthopedic Exam: Muscle tone and strength are WNL. No limitations in general ROM. No crepitus or effusions noted. Foot type and digits show no abnormalities. Bony prominences are unremarkable. Dorsal exostosis  B/l Skin: No Porokeratosis. No infection or ulcers  Diagnosis:  Onychomycosis, , Pain in right toe, pain in left toes  Treatment & Plan Procedures and Treatment: Consent by patient was obtained for treatment procedures. The patient understood the discussion of treatment and procedures well. All questions were answered thoroughly reviewed. Debridement of mycotic and hypertrophic toenails, 1 through 5 bilateral and clearing of subungual debris. No ulceration, no infection noted.  Return Visit-Office Procedure: Patient instructed to return to the office for a follow up visit 4 months for continued evaluation and treatment.    Gardiner Barefoot DPM

## 2019-02-17 ENCOUNTER — Ambulatory Visit (INDEPENDENT_AMBULATORY_CARE_PROVIDER_SITE_OTHER): Payer: Medicare Other

## 2019-02-17 ENCOUNTER — Ambulatory Visit (INDEPENDENT_AMBULATORY_CARE_PROVIDER_SITE_OTHER): Payer: Medicare Other | Admitting: Vascular Surgery

## 2019-02-17 ENCOUNTER — Other Ambulatory Visit: Payer: Self-pay

## 2019-02-17 ENCOUNTER — Encounter (INDEPENDENT_AMBULATORY_CARE_PROVIDER_SITE_OTHER): Payer: Self-pay | Admitting: Vascular Surgery

## 2019-02-17 VITALS — BP 188/93 | HR 94 | Ht 64.0 in | Wt 260.0 lb

## 2019-02-17 DIAGNOSIS — I10 Essential (primary) hypertension: Secondary | ICD-10-CM

## 2019-02-17 DIAGNOSIS — Z794 Long term (current) use of insulin: Secondary | ICD-10-CM

## 2019-02-17 DIAGNOSIS — E782 Mixed hyperlipidemia: Secondary | ICD-10-CM | POA: Diagnosis not present

## 2019-02-17 DIAGNOSIS — E119 Type 2 diabetes mellitus without complications: Secondary | ICD-10-CM | POA: Diagnosis not present

## 2019-02-17 DIAGNOSIS — I6523 Occlusion and stenosis of bilateral carotid arteries: Secondary | ICD-10-CM

## 2019-02-17 NOTE — Progress Notes (Signed)
MRN : [id number]  Ryan Mcclure is a 86 y.o. (08-29-1931) male who presents with chief complaint of  Chief Complaint  Patient presents with  . Follow-up    1 year Carotid   .  History of Present Illness: Patient returns in follow-up of his carotid disease.  He is about a decade status post bilateral carotid endarterectomies.  He has had progressive medical issues over the past several years but no focal neurologic symptoms since his initial event prior to his surgeries many years ago.  His carotid duplex today shows both carotid endarterectomies to be widely patent without significant recurrent stenosis.  Current Outpatient Medications  Medication Sig Dispense Refill  . atorvastatin (LIPITOR) 40 MG tablet Take 40 mg by mouth daily.    . blood glucose meter kit and supplies KIT Dispense based on patient and insurance preference. Use up to four times daily as directed. (FOR ICD-9 250.00, 250.01). 1 each 0  . clopidogrel (PLAVIX) 75 MG tablet Take by mouth.    . Cyanocobalamin (RA VITAMIN B-12 TR) 1000 MCG TBCR Take 1 tablet by mouth daily.     Water engineer Bandages & Supports (MEDICAL COMPRESSION STOCKINGS) MISC Please provide 72mHg compression stockings 1 each 0  . ferrous sulfate 325 (65 FE) MG tablet TAKE 1 TABLET BY MOUTH THREE TIMES DAILY WITH MEALS    . fluticasone (FLONASE) 50 MCG/ACT nasal spray Place 1 spray into both nostrils daily as needed for allergies.     . furosemide (LASIX) 20 MG tablet Take 40 mg by mouth 2 (two) times daily.  10  . glucose blood (CONTOUR NEXT TEST) test strip TEST FOUR TIMES DAILY    . insulin aspart (NOVOLOG) 100 UNIT/ML FlexPen Take 8 units with breakfast, 12 units with lunch, 16 units with supper. MDD 50 units.    . Insulin Glargine (BASAGLAR KWIKPEN) 100 UNIT/ML SOPN Inject 24 Units into the skin daily.    . Lancets 28G MISC Use 1 each 4 (four) times daily Use as instructed.    .Marland Kitchenlisinopril (PRINIVIL,ZESTRIL) 5 MG tablet Take by mouth.    .Marland Kitchen lisinopril (ZESTRIL) 10 MG tablet TK 1 T PO ONCE D    . metFORMIN (GLUCOPHAGE) 500 MG tablet Take 500 mg by mouth 2 (two) times daily.  2  . metoprolol tartrate (LOPRESSOR) 50 MG tablet TAKE 1 TABLET BY MOUTH TWICE DAILY    . nitroGLYCERIN (NITROSTAT) 0.4 MG SL tablet take 1 tablet under the tongue every 5 minutes if needed for chest pain    . omeprazole (PRILOSEC) 40 MG capsule Take 40 mg by mouth 2 (two) times daily.     .Marland Kitchenomeprazole (PRILOSEC) 40 MG capsule TAKE 1 CAPSULE BY MOUTH TWICE DAILY    . telmisartan (MICARDIS) 80 MG tablet Take by mouth.    .Marland KitchenRA ASPIRIN EC ADULT LOW ST 81 MG EC tablet Take 1 tablet (81 mg total) by mouth daily. 30 tablet 0   No current facility-administered medications for this visit.    Past Medical History:  Diagnosis Date  . Chronic systolic CHF (congestive heart failure) ([place]   . CKD (chronic kidney disease), stage III   . Diabetes ([place]   . HBP (high blood pressure)   . Heart attack ([place]   . High cholesterol   . Stroke (cerebrum) ([place]   . Swelling     Past Surgical History:  Procedure Laterality Date  . COLONOSCOPY N/A 10/21/2015   Procedure: COLONOSCOPY;  Surgeon: DQuillian Quince  Merrily Brittle, MD;  Location: [facility];  Service: Endoscopy;  Laterality: N/A;  . EXPLORATION POST OPERATIVE OPEN HEART    . EYE SURGERY Bilateral      Social History   Tobacco Use  . Smoking status: Former Smoker    Types: Cigarettes    Quit date: 01/30/1995    Years since quitting: 24.0  . Smokeless tobacco: Never Used  Substance Use Topics  . Alcohol use: No  . Drug use: No     Family History  Problem Relation Age of Onset  . Diabetes Mellitus I Mother   . Kidney failure Mother   . Alcoholism Father   . Heart attack Sister   . Prostate cancer Brother     Allergies  Allergen Reactions  . Neuromuscular Blocking Agents Nausea And Vomiting and Other (See Comments)    Tongue swelling, lips swelling   . Other Other (See Comments)    Tongue/lips swelling with  steroid dose pack, pt does not recall exact name.   . Amlodipine Nausea And Vomiting    REVIEW OF SYSTEMS(Negative unless checked)  Constitutional: [] ??Weight loss[] ??Fever[] ??Chills Cardiac:[] ??Chest pain[] ??Chest pressure[] ??Palpitations [] ??Shortness of breath when laying flat [] ??Shortness of breath at rest [] ??Shortness of breath with exertion. Vascular: [] ??Pain in legs with walking[] ??Pain in legsat rest[] ??Pain in legs when laying flat [] ??Claudication [] ??Pain in feet when walking [] ??Pain in feet at rest [] ??Pain in feet when laying flat [] ??History of DVT [] ??Phlebitis [x] ??Swelling in legs [] ??Varicose veins [] ??Non-healing ulcers Pulmonary: [] ??Uses home oxygen [] ??Productive cough[] ??Hemoptysis [] ??Wheeze [] ??COPD [] ??Asthma Neurologic: [] ??Dizziness [] ??Blackouts [] ??Seizures [x] ??History of stroke [] ??History of TIA[] ??Aphasia [] ??Temporary blindness[] ??Dysphagia [] ??Weaknessor numbness in arms [] ??Weakness or numbnessin legs Musculoskeletal: [x] ??Arthritis [] ??Joint swelling [] ??Joint pain [] ??Low back pain Hematologic:[] ??Easy bruising[] ??Easy bleeding [] ??Hypercoagulable state [] ??Anemic [] ??Hepatitis Gastrointestinal:[] ??Blood in stool[] ??Vomiting blood[] ??Gastroesophageal reflux/heartburn[] ??Difficulty swallowing. Genitourinary: [x] ??Chronic kidney disease [] ??Difficulturination [] ??Frequenturination [] ??Burning with urination[] ??Blood in urine Skin: [] ??Rashes [] ??Ulcers [] ??Wounds Psychological: [] ??History of anxiety[] ??History of major depression.  Physical Examination  Vitals:   02/17/19 1336  BP: (!) 188/93  Pulse: 94  Weight: 260 lb (117.9 kg)  Height: 5' 4"  (1.626 m)   Body mass index is 44.63 kg/m. Gen:  WD/WN, NAD Head: [place]/AT, No temporalis wasting. Ear/Nose/Throat: Hearing somewhat diminished, nares w/o erythema or drainage, trachea  midline Eyes: Conjunctiva clear. Sclera non-icteric Neck: Supple.  No carotid bruit  Pulmonary:  Good air movement, equal and clear to auscultation bilaterally.  Cardiac: RRR, No JVD Vascular:  Vessel Right Left  Radial Palpable Palpable       Musculoskeletal: M/S 5/5 throughout.  No deformity or atrophy.  Uses a walker.  Mild bilateral lower extremity edema. Neurologic: CN 2-12 intact. Sensation grossly intact in extremities.  Symmetrical.  Speech is fluent. Motor exam as listed above. Psychiatric: Judgment intact, Mood & affect appropriate for pt's clinical situation. Dermatologic: No rashes or ulcers noted.  No cellulitis or open wounds.      CBC Lab Results  Component Value Date   WBC 8.0 08/16/2017   HGB 10.8 (L) 08/16/2017   HCT 33.5 (L) 08/16/2017   MCV 83.6 08/16/2017   PLT 140 (L) 08/16/2017    BMET    Component Value Date/Time   NA 143 08/16/2017 0352   NA 142 07/11/2012 1948   K 4.5 08/16/2017 0352   K 4.1 07/11/2012 1948   CL 109 08/16/2017 0352   CL 107 07/11/2012 1948   CO2 29 08/16/2017 0352   CO2 27 07/11/2012 1948   GLUCOSE 153 (H) 08/16/2017 0352   GLUCOSE 154 (H) 07/11/2012 1948  BUN 27 (H) 08/16/2017 0352   BUN 32 (H) 07/11/2012 1948   CREATININE 1.54 (H) 08/16/2017 0352   CREATININE 2.02 (H) 07/11/2012 1948   CALCIUM 8.9 08/16/2017 0352   CALCIUM 8.7 07/11/2012 1948   GFRNONAA 40 (L) 08/16/2017 0352   GFRNONAA 31 (L) 07/11/2012 1948   GFRAA 46 (L) 08/16/2017 0352   GFRAA 36 (L) 07/11/2012 1948   CrCl cannot be calculated (Patient's most recent lab result is older than the maximum 21 days allowed.).  COAG Lab Results  Component Value Date   INR 1.06 10/18/2015   INR 1.3 02/22/2011    Radiology No results found.    Assessment/Plan Controlled type 2 diabetes mellitus without complication (HCC) blood glucose control important in reducing the progression of atherosclerotic disease. Also, involved in wound healing. On appropriate  medications.   Combined fat and carbohydrate induced hyperlipemia lipid control important in reducing the progression of atherosclerotic disease. Continue statin therapy   Benign essential HTN blood pressure control important in reducing the progression of atherosclerotic disease. On appropriate oral medications.  Carotid stenosis His carotid duplex today shows both carotid endarterectomies to be widely patent without significant recurrent stenosis.  Continue current medical regimen.  Check on an annual basis.    Leotis Pain, MD  02/17/2019 2:06 PM    This note was created with Dragon medical transcription system.  Any errors from dictation are purely unintentional

## 2019-02-17 NOTE — Assessment & Plan Note (Signed)
His carotid duplex today shows both carotid endarterectomies to be widely patent without significant recurrent stenosis.  Continue current medical regimen.  Check on an annual basis.

## 2019-05-28 ENCOUNTER — Ambulatory Visit: Payer: Medicare Other | Admitting: Podiatry

## 2019-07-30 ENCOUNTER — Ambulatory Visit (INDEPENDENT_AMBULATORY_CARE_PROVIDER_SITE_OTHER): Payer: Medicare Other | Admitting: Podiatry

## 2019-07-30 ENCOUNTER — Other Ambulatory Visit: Payer: Self-pay

## 2019-07-30 ENCOUNTER — Encounter: Payer: Self-pay | Admitting: Podiatry

## 2019-07-30 DIAGNOSIS — M79676 Pain in unspecified toe(s): Secondary | ICD-10-CM | POA: Diagnosis not present

## 2019-07-30 DIAGNOSIS — B351 Tinea unguium: Secondary | ICD-10-CM | POA: Diagnosis not present

## 2019-07-30 DIAGNOSIS — D689 Coagulation defect, unspecified: Secondary | ICD-10-CM | POA: Diagnosis not present

## 2019-07-30 DIAGNOSIS — E1142 Type 2 diabetes mellitus with diabetic polyneuropathy: Secondary | ICD-10-CM | POA: Diagnosis not present

## 2019-07-30 NOTE — Progress Notes (Signed)
This patient returns to my office for at risk foot care.  This patient requires this care by a professional since this patient will be at risk due to having chronic kidney disease and diabetes.  This patient is unable to cut nails himself since the patient cannot reach his nails.These nails are painful walking and wearing shoes.  This patient presents for at risk foot care today.  General Appearance  Alert, conversant and in no acute stress.  Vascular  Dorsalis pedis and posterior tibial  pulses are palpable  bilaterally.  Capillary return is within normal limits  bilaterally. Temperature is within normal limits  bilaterally.  Neurologic  Senn-Weinstein monofilament wire test within normal limits  bilaterally. Muscle power within normal limits bilaterally.  Nails Thick disfigured discolored nails with subungual debris  from hallux to fifth toes bilaterally. No evidence of bacterial infection or drainage bilaterally.  Orthopedic  No limitations of motion  feet .  No crepitus or effusions noted.  No bony pathology or digital deformities noted.  Skin  normotropic skin with no porokeratosis noted bilaterally.  No signs of infections or ulcers noted.     Onychomycosis  Pain in right toes  Pain in left toes  Consent was obtained for treatment procedures.   Mechanical debridement of nails 1-5  bilaterally performed with a nail nipper.  Filed with dremel without incident.    Return office visit   6 months                  Told patient to return for periodic foot care and evaluation due to potential at risk complications.   Gardiner Barefoot DPM

## 2019-08-19 IMAGING — US US EXTREM LOW VENOUS*R*
1 series · 14 of 24 positions shown · non-contrast
Comparison: 02/21/2011

CLINICAL DATA: Pain x3 weeks, swelling x1 day

EXAM:
RIGHT LOWER EXTREMITY VENOUS DOPPLER ULTRASOUND
TECHNIQUE: Gray-scale sonography with compression, as well as color and duplex
ultrasound, were performed to evaluate the deep venous system from
the level of the common femoral vein through the popliteal and
proximal calf veins.

[Series 1: us extrem low venous*right* · 0.08mm/px · 14 of 40 slices shown]
[im 1/40]
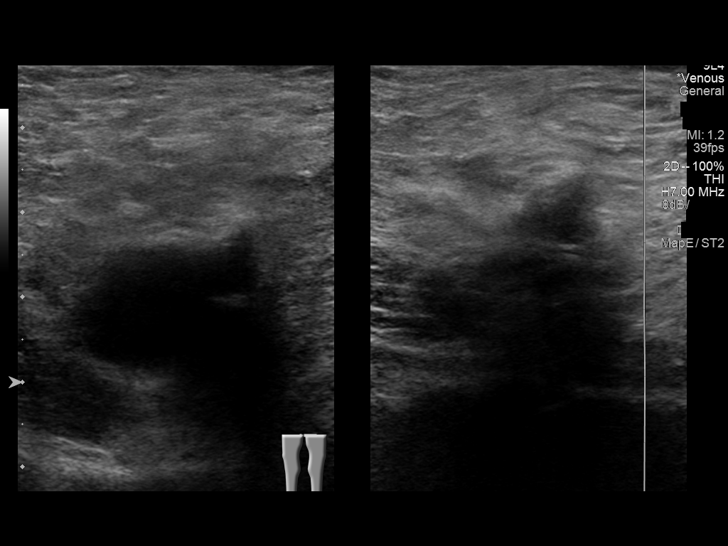
[im 4/40]
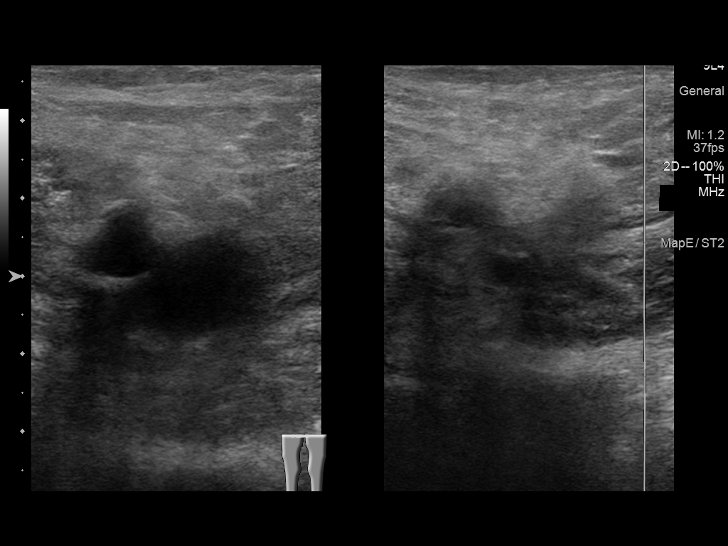
[im 7/40]
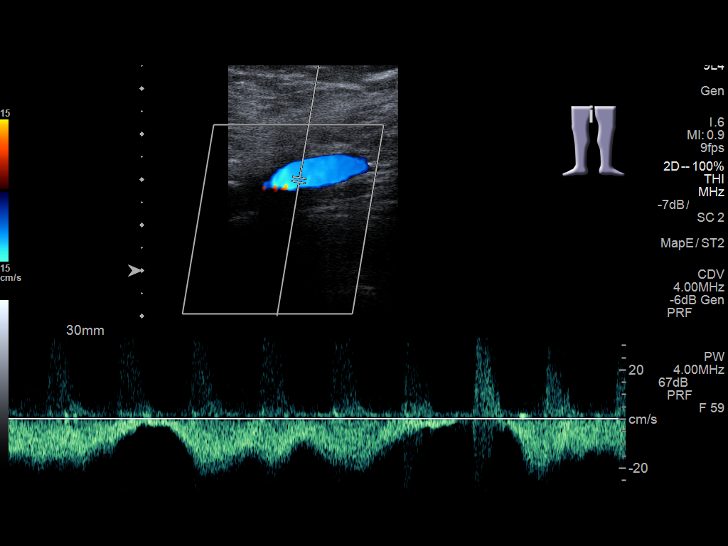
[im 11/40]
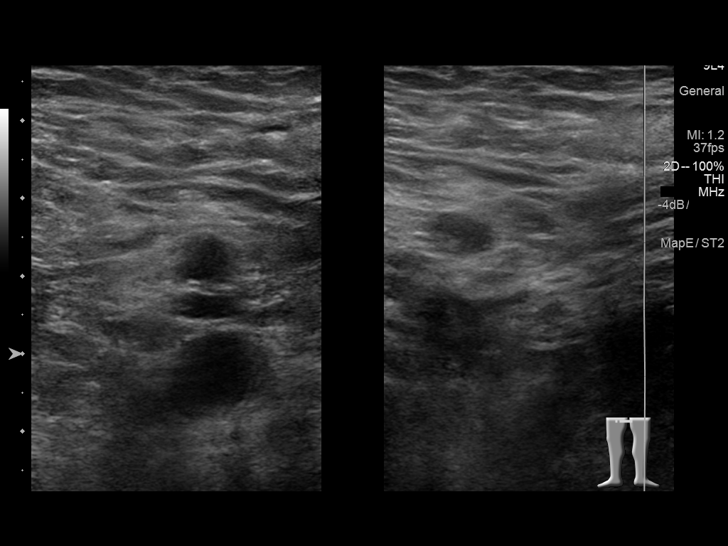
[im 12/40]
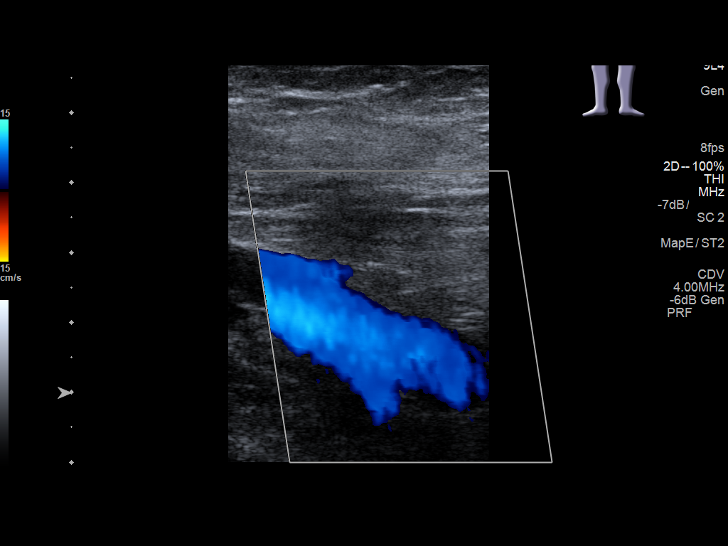
[im 16/40]
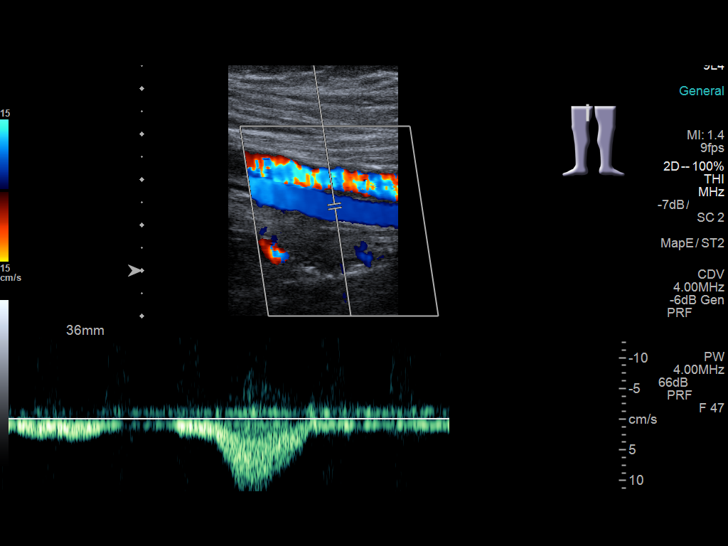
[im 19/40]
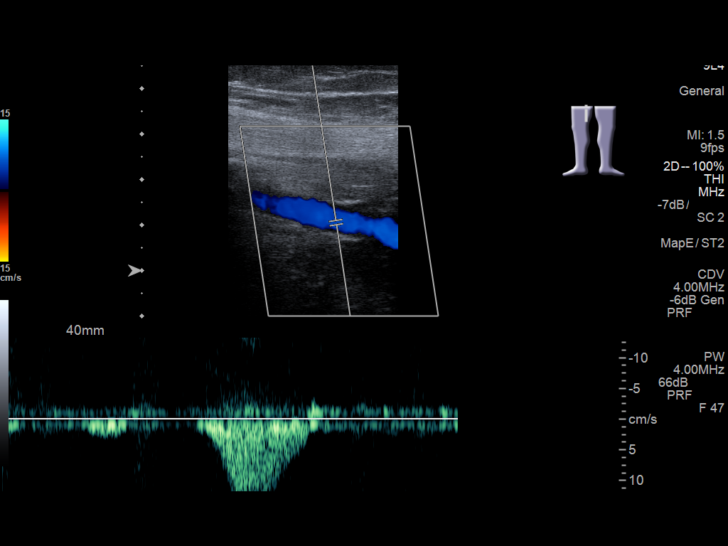
[im 21/40]
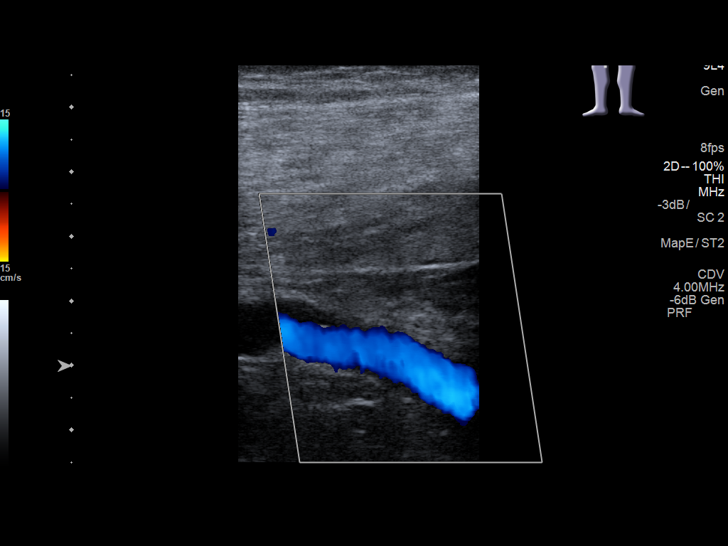
[im 24/40]
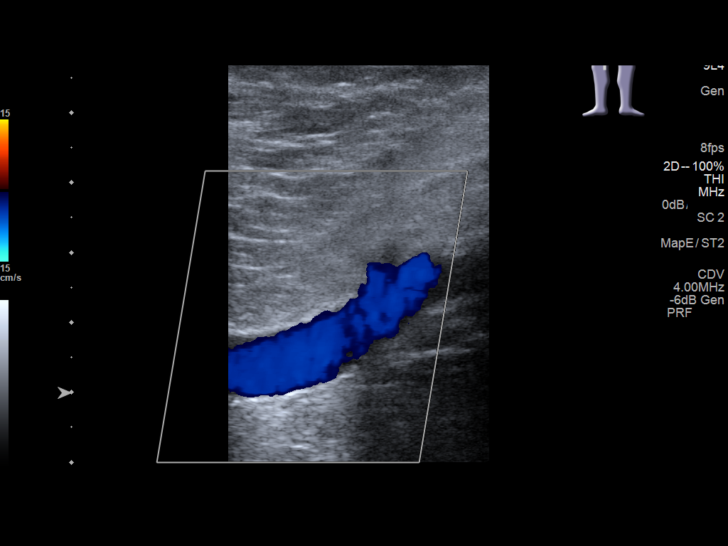
[im 28/40]
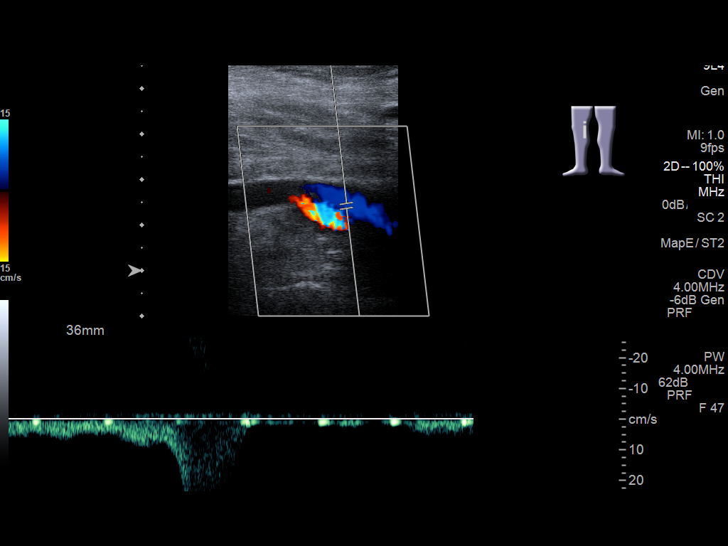
[im 31/40]
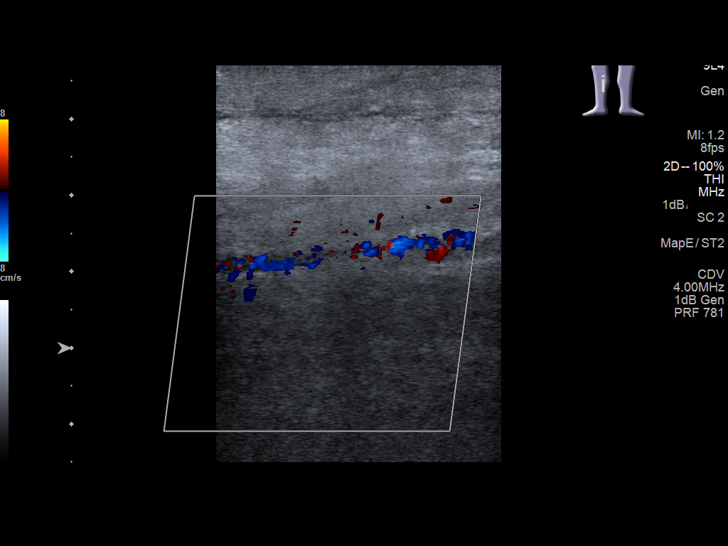
[im 33/40]
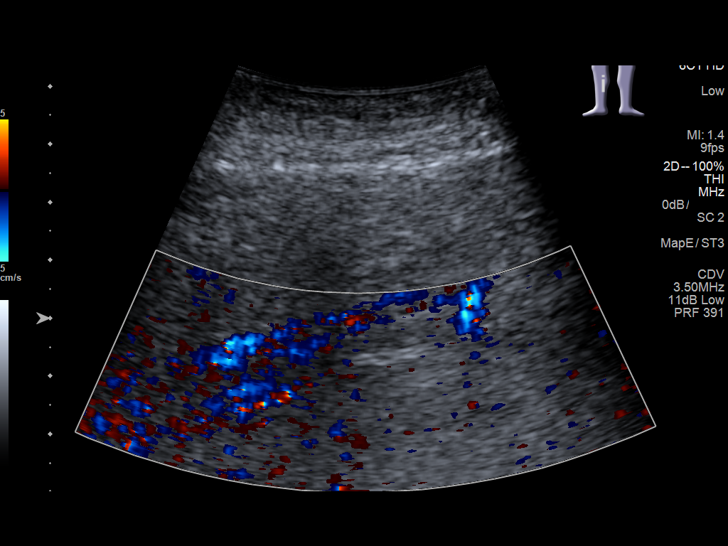
[im 36/40]
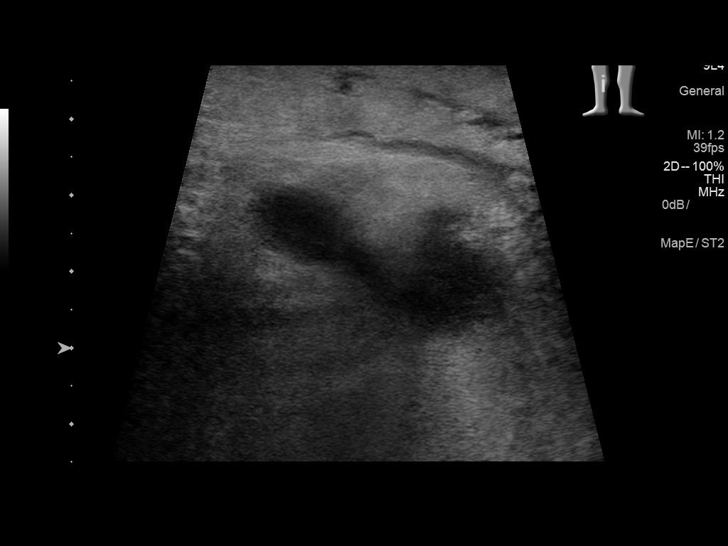
[im 40/40]
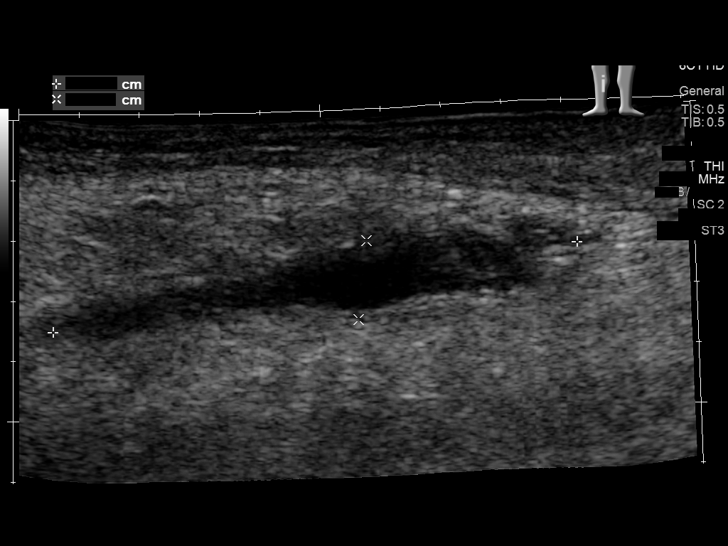

[14 of 24 positions shown; findings below may reference images not displayed]

FINDINGS: Normal compressibility of the common femoral, superficial femoral,
and popliteal veins, as well as the proximal calf veins. No filling
defects to suggest DVT on grayscale or color Doppler imaging.
Doppler waveforms show normal direction of venous flow, normal
respiratory phasicity and response to augmentation. There is an
elongated deep subcutaneous collection in the right calf measuring
8.8 x 1.3 cm. Survey views of the contralateral common femoral vein
are unremarkable.
IMPRESSION: 1.  No evidence of right lower extremity deep vein thrombosis.
2. Deep subcutaneous elongated collection in the right calf ,
possibly ruptured Baker's cyst versus hematoma/seroma.

## 2019-11-07 ENCOUNTER — Emergency Department: Payer: Medicare Other

## 2019-11-07 ENCOUNTER — Emergency Department
Admission: EM | Admit: 2019-11-07 | Discharge: 2019-11-07 | Disposition: A | Discharge status: Home / Self Care | Payer: Medicare Other | Attending: Emergency Medicine | Admitting: Emergency Medicine

## 2019-11-07 ENCOUNTER — Encounter: Payer: Self-pay | Admitting: Radiology

## 2019-11-07 ENCOUNTER — Other Ambulatory Visit: Payer: Self-pay

## 2019-11-07 DIAGNOSIS — I5022 Chronic systolic (congestive) heart failure: Secondary | ICD-10-CM | POA: Insufficient documentation

## 2019-11-07 DIAGNOSIS — Z794 Long term (current) use of insulin: Secondary | ICD-10-CM | POA: Insufficient documentation

## 2019-11-07 DIAGNOSIS — E1169 Type 2 diabetes mellitus with other specified complication: Secondary | ICD-10-CM | POA: Diagnosis not present

## 2019-11-07 DIAGNOSIS — Z7984 Long term (current) use of oral hypoglycemic drugs: Secondary | ICD-10-CM | POA: Insufficient documentation

## 2019-11-07 DIAGNOSIS — R791 Abnormal coagulation profile: Secondary | ICD-10-CM | POA: Diagnosis not present

## 2019-11-07 DIAGNOSIS — I130 Hypertensive heart and chronic kidney disease with heart failure and stage 1 through stage 4 chronic kidney disease, or unspecified chronic kidney disease: Secondary | ICD-10-CM | POA: Insufficient documentation

## 2019-11-07 DIAGNOSIS — Z79899 Other long term (current) drug therapy: Secondary | ICD-10-CM | POA: Insufficient documentation

## 2019-11-07 DIAGNOSIS — Z87891 Personal history of nicotine dependence: Secondary | ICD-10-CM | POA: Insufficient documentation

## 2019-11-07 DIAGNOSIS — N179 Acute kidney failure, unspecified: Secondary | ICD-10-CM

## 2019-11-07 DIAGNOSIS — N183 Chronic kidney disease, stage 3 unspecified: Secondary | ICD-10-CM | POA: Insufficient documentation

## 2019-11-07 DIAGNOSIS — R7989 Other specified abnormal findings of blood chemistry: Secondary | ICD-10-CM

## 2019-11-07 DIAGNOSIS — R072 Precordial pain: Secondary | ICD-10-CM | POA: Diagnosis present

## 2019-11-07 DIAGNOSIS — R079 Chest pain, unspecified: Secondary | ICD-10-CM

## 2019-11-07 LAB — COMPREHENSIVE METABOLIC PANEL
ALT: 11 U/L (ref 0–44)
AST: 17 U/L (ref 15–41)
Albumin: 3.6 g/dL (ref 3.5–5.0)
Alkaline Phosphatase: 88 U/L (ref 38–126)
Anion gap: 8 (ref 5–15)
BUN: 37 mg/dL — ABNORMAL HIGH (ref 8–23)
CO2: 25 mmol/L (ref 22–32)
Calcium: 9.4 mg/dL (ref 8.9–10.3)
Chloride: 106 mmol/L (ref 98–111)
Creatinine, Ser: 2.21 mg/dL — ABNORMAL HIGH (ref 0.61–1.24)
GFR, Estimated: 26 mL/min — ABNORMAL LOW (ref >60)
Glucose, Bld: 135 mg/dL — ABNORMAL HIGH (ref 70–99)
Potassium: 4.1 mmol/L (ref 3.5–5.1)
Sodium: 139 mmol/L (ref 135–145)
Total Bilirubin: 0.4 mg/dL (ref 0.3–1.2)
Total Protein: 7.8 g/dL (ref 6.5–8.1)

## 2019-11-07 LAB — CBC WITH DIFFERENTIAL/PLATELET
Abs Immature Granulocytes: 0.06 10*3/uL (ref 0.00–0.07)
Basophils Absolute: 0.1 10*3/uL (ref 0.0–0.1)
Basophils Relative: 1 %
Eosinophils Absolute: 0.4 10*3/uL (ref 0.0–0.5)
Eosinophils Relative: 4 %
HCT: 37.3 % — ABNORMAL LOW (ref 39.0–52.0)
Hemoglobin: 11.7 g/dL — ABNORMAL LOW (ref 13.0–17.0)
Immature Granulocytes: 1 %
Lymphocytes Relative: 17 %
Lymphs Abs: 1.7 10*3/uL (ref 0.7–4.0)
MCH: 27.1 pg (ref 26.0–34.0)
MCHC: 31.4 g/dL (ref 30.0–36.0)
MCV: 86.3 fL (ref 80.0–100.0)
Monocytes Absolute: 1 10*3/uL (ref 0.1–1.0)
Monocytes Relative: 10 %
Neutro Abs: 6.9 10*3/uL (ref 1.7–7.7)
Neutrophils Relative %: 67 %
Platelets: 139 10*3/uL — ABNORMAL LOW (ref 150–400)
RBC: 4.32 MIL/uL (ref 4.22–5.81)
RDW: 14 % (ref 11.5–15.5)
WBC: 10 10*3/uL (ref 4.0–10.5)
nRBC: 0 % (ref 0.0–0.2)

## 2019-11-07 LAB — TROPONIN I (HIGH SENSITIVITY)
Troponin I (High Sensitivity): 21 ng/L — ABNORMAL HIGH (ref <18)
Troponin I (High Sensitivity): 22 ng/L — ABNORMAL HIGH (ref <18)

## 2019-11-07 LAB — FIBRIN DERIVATIVES D-DIMER (ARMC ONLY): Fibrin derivatives D-dimer (ARMC): 5976.03 ng/mL (FEU) — ABNORMAL HIGH (ref 0.00–499.00)

## 2019-11-07 LAB — BRAIN NATRIURETIC PEPTIDE: B Natriuretic Peptide: 286.1 pg/mL — ABNORMAL HIGH (ref 0.0–100.0)

## 2019-11-07 MED ORDER — IOHEXOL 350 MG/ML SOLN
60.0000 mL | Freq: Once | INTRAVENOUS | Status: AC | PRN
  Administered 2019-11-07: 60 mL via INTRAVENOUS

## 2019-11-07 MED ORDER — SODIUM CHLORIDE 0.9% FLUSH: 10.0000 mL | Freq: Two times a day (BID) | INTRAVENOUS | Status: DC

## 2019-11-07 MED ORDER — LACTATED RINGERS IV BOLUS
500.0000 mL | Freq: Once | INTRAVENOUS | Status: AC
  Administered 2019-11-07: 500 mL via INTRAVENOUS

## 2019-11-07 MED ORDER — METOPROLOL TARTRATE 50 MG PO TABS: 50.0000 mg | ORAL_TABLET | Freq: Two times a day (BID) | ORAL | Status: DC

## 2019-11-07 MED ORDER — SODIUM CHLORIDE 0.9% FLUSH: 10.0000 mL | INTRAVENOUS | Status: DC | PRN

## 2019-11-07 NOTE — ED Notes (Signed)
Iv team here to place ultrasound guided iv for ct scan.

## 2019-11-07 NOTE — ED Notes (Signed)
Returned phone call to patient's sister, Stanton Kidney. Update provided. Stanton Kidney will pick patient up for DC in ~70min.

## 2019-11-07 NOTE — ED Provider Notes (Signed)
[facility] Emergency Department Provider Note  ____________________________________________   First MD Initiated Contact with Patient 11/07/19 [phone]     (approximate)  I have reviewed the triage vital signs and the nursing notes.   HISTORY  Chief Complaint Chest Pain   HPI Ryan Mcclure is a 86 y.o. male with a past medical history of CAD, HTN, HDL, DM, CKD, CVA, CHF who presents via EMS from assisted living facility for assessment of some substernal chest pain that woke him from sleep shortly prior to arrival.  Patient states his chest pain has since resolved and he is not currently in any pain at all.  He states when he went to sleep he did not have any pain.  He states he has had episodes like this in the past and seemed to ease off faster and he did not feel the need to call EMS in the past.  He denies any other recent symptoms including headache, earache, sore throat, cough, shortness of breath, nausea, vomiting, diarrhea, dysuria, abdominal pain, back pain, rash, extremity pain, recent falls or injuries.  Denies tobacco abuse or EtOH use.  States he takes all of his medications and there have been no recent changes.  No other acute concerns at this time.         Past Medical History:  Diagnosis Date  . Chronic systolic CHF (congestive heart failure) ([place])   . CKD (chronic kidney disease), stage III ([place])   . Diabetes ([place])   . HBP (high blood pressure)   . Heart attack ([place])   . High cholesterol   . Stroke (cerebrum) ([place])   . Swelling     Patient Active Problem List   Diagnosis Date Noted  . Cerebrovascular accident (CVA) due to occlusion of left posterior cerebral artery ([place]) 11/20/2017  . Hyperlipidemia due to type 2 diabetes mellitus ([place]) 09/26/2017  . Ulcer 09/26/2017  . Stroke ([place]) 08/15/2017  . Carotid stenosis 01/31/2016  . Hematochezia   . Diverticulosis of large intestine without hemorrhage   . Hemorrhoid   . GIB  (gastrointestinal bleeding) 10/18/2015  . Blood loss anemia 10/18/2015  . CKD (chronic kidney disease), stage III ([place])   . Chronic systolic CHF (congestive heart failure) ([place])   . Stroke (cerebrum) ([place])   . Cardiomyopathy, ischemic 10/12/2015  . Shortness of breath 09/14/2015  . Absolute anemia 06/10/2015  . Cataract cortical, senile 06/10/2015  . Controlled type 2 diabetes mellitus without complication ([place]) 95/18/8416  . Acid reflux 06/10/2015  . Combined fat and carbohydrate induced hyperlipemia 06/10/2015  . Cerebrovascular accident (CVA) ([place]) 06/10/2015  . Non-pressure chronic ulcer of skin of other sites with unspecified severity ([place]) 06/10/2015  . Benign essential HTN 08/25/2014  . Hypertension associated with diabetes ([place]) 08/25/2014  . Arteriosclerosis of coronary artery 09/09/2013  . Microalbuminuria 09/08/2013  . Diabetes mellitus ([place]) 09/08/2013    Past Surgical History:  Procedure Laterality Date  . COLONOSCOPY N/A 10/21/2015   Procedure: COLONOSCOPY;  Surgeon: Milus Banister, MD;  Location: [facility] ENDOSCOPY;  Service: Endoscopy;  Laterality: N/A;  . EXPLORATION POST OPERATIVE OPEN HEART    . EYE SURGERY Bilateral     Prior to Admission medications   Medication Sig Start Date End Date Taking? Authorizing Provider  atorvastatin (LIPITOR) 40 MG tablet Take 40 mg by mouth daily.    [provider]  blood glucose meter kit and supplies KIT Dispense based on patient and insurance preference. Use up to four times  daily as directed. (FOR ICD-9 250.00, 250.01). 06/21/17   Harvest Dark, MD  clopidogrel (PLAVIX) 75 MG tablet Take by mouth. 11/11/17   [provider]  Cyanocobalamin (RA VITAMIN B-12 TR) 1000 MCG TBCR Take 1 tablet by mouth daily.     [provider]  Elastic Bandages & Supports (MEDICAL COMPRESSION STOCKINGS) MISC Please provide 62mHg compression stockings 06/21/17   PHarvest Dark MD  ferrous sulfate 325 (65 FE) MG tablet  TAKE 1 TABLET BY MOUTH THREE TIMES DAILY WITH MEALS 12/23/17   [provider]  fluticasone (FLONASE) 50 MCG/ACT nasal spray Place 1 spray into both nostrils daily as needed for allergies.  06/03/13   [provider]  furosemide (LASIX) 20 MG tablet Take 40 mg by mouth 2 (two) times daily. 10/11/17   [provider]  glucose blood (CONTOUR NEXT TEST) test strip TEST FOUR TIMES DAILY 12/02/17   [provider]  insulin aspart (NOVOLOG) 100 UNIT/ML FlexPen Take 8 units with breakfast, 12 units with lunch, 16 units with supper. MDD 50 units. 10/03/17   [provider]  Insulin Glargine (BASAGLAR KWIKPEN) 100 UNIT/ML SOPN Inject 24 Units into the skin daily.    [provider]  Lancets 28G MISC Use 1 each 4 (four) times daily Use as instructed. 10/18/17   [provider]  lisinopril (PRINIVIL,ZESTRIL) 5 MG tablet Take by mouth. 10/16/17   [provider]  lisinopril (ZESTRIL) 10 MG tablet TK 1 T PO ONCE D 04/12/18   [provider]  metFORMIN (GLUCOPHAGE) 500 MG tablet Take 500 mg by mouth 2 (two) times daily. 07/23/17   [provider]  metoprolol tartrate (LOPRESSOR) 50 MG tablet TAKE 1 TABLET BY MOUTH TWICE DAILY 11/18/17   [provider]  nitroGLYCERIN (NITROSTAT) 0.4 MG SL tablet take 1 tablet under the tongue every 5 minutes if needed for chest pain 10/29/14   [provider]  omeprazole (PRILOSEC) 40 MG capsule Take 40 mg by mouth 2 (two) times daily.     [provider]  omeprazole (PRILOSEC) 40 MG capsule TAKE 1 CAPSULE BY MOUTH TWICE DAILY 01/06/19   [provider]  RA ASPIRIN EC ADULT LOW ST 81 MG EC tablet Take 1 tablet (81 mg total) by mouth daily. 11/02/15   BKinnie Feil MD  telmisartan (MICARDIS) 80 MG tablet Take by mouth. 12/19/18 12/19/19  [provider]    Allergies Neuromuscular blocking agents, Other, Shrimp [shellfish allergy], and  Amlodipine  Family History  Problem Relation Age of Onset  . Diabetes Mellitus I Mother   . Kidney failure Mother   . Alcoholism Father   . Heart attack Sister   . Prostate cancer Brother     Social History Social History   Tobacco Use  . Smoking status: Former Smoker    Types: Cigarettes    Quit date: 01/30/1995    Years since quitting: 24.7  . Smokeless tobacco: Never Used  Vaping Use  . Vaping Use: Never used  Substance Use Topics  . Alcohol use: No  . Drug use: No    Review of Systems  Review of Systems  Constitutional: Negative for chills and fever.  HENT: Negative for sore throat.   Eyes: Negative for pain.  Respiratory: Negative for cough and stridor.   Cardiovascular: Positive for chest pain.  Gastrointestinal: Negative for vomiting.  Skin: Negative for rash.  Neurological: Negative for seizures, loss of consciousness and headaches.  Psychiatric/Behavioral: Negative for suicidal ideas.  All other systems reviewed and are negative.     ____________________________________________   PHYSICAL EXAM:  VITAL SIGNS: ED Triage Vitals  Enc Vitals Group     BP      Pulse      Resp      Temp      Temp src      SpO2      Weight      Height      Head Circumference      Peak Flow      Pain Score      Pain Loc      Pain Edu?      Excl. in [place]?    Vitals:   11/07/19 0530 11/07/19 0600  BP: 138/62 (!) 153/63  Pulse: 61 (!) 59  Resp: 20 18  SpO2: 96% 98%   Physical Exam Vitals and nursing note reviewed.  Constitutional:      Appearance: He is well-developed. He is obese.  HENT:     Head: Normocephalic and atraumatic.     Right Ear: External ear normal.     Left Ear: External ear normal.     Nose: Nose normal.     Mouth/Throat:     Mouth: Mucous membranes are moist.  Eyes:     Conjunctiva/sclera: Conjunctivae normal.  Cardiovascular:     Rate and Rhythm: Normal rate and regular rhythm.     Heart sounds: No murmur heard.   Pulmonary:      Effort: Pulmonary effort is normal. No respiratory distress.     Breath sounds: Normal breath sounds.  Abdominal:     Palpations: Abdomen is soft.     Tenderness: There is no abdominal tenderness.  Musculoskeletal:     Cervical back: Neck supple.  Skin:    General: Skin is warm and dry.     Capillary Refill: Capillary refill takes less than 2 seconds.  Neurological:     Mental Status: He is alert and oriented to person, place, and time.  Psychiatric:        Mood and Affect: Mood normal.      ____________________________________________   LABS (all labs ordered are listed, but only abnormal results are displayed)  Labs Reviewed  CBC WITH DIFFERENTIAL/PLATELET - Abnormal; Notable for the following components:      Result Value   Hemoglobin 11.7 (*)    HCT 37.3 (*)    Platelets 139 (*)    All other components within normal limits  COMPREHENSIVE METABOLIC PANEL - Abnormal; Notable for the following components:   Glucose, Bld 135 (*)    BUN 37 (*)    Creatinine, Ser 2.21 (*)    GFR, Estimated 26 (*)    All other components within normal limits  BRAIN NATRIURETIC PEPTIDE - Abnormal; Notable for the following components:   B Natriuretic Peptide 286.1 (*)    All other components within normal limits  FIBRIN DERIVATIVES D-DIMER ([facility] ONLY) - Abnormal; Notable for the following components:   Fibrin derivatives D-dimer [facility]) [phone] (*)    All other components within normal limits  TROPONIN I (HIGH SENSITIVITY) - Abnormal; Notable for the following components:   Troponin I (High Sensitivity) 21 (*)    All other components within normal limits  TROPONIN I (HIGH SENSITIVITY) - Abnormal; Notable for the following components:   Troponin I (High Sensitivity) 22 (*)    All other components within normal limits   ____________________________________________  EKG  Sinus rhythm with a ventricular rate  of 64, first-degree AV block with a PR interval of 268 and otherwise unremarkable  intervals with right bundle branch block and left anterior fascicle block no other clear evidence of acute ischemia.  Right bundle branch block is not new today and noted on multiple prior EKGs. ____________________________________________  RADIOLOGY  ED MD interpretation: No focal consolidation, effusion, pneumothorax, significant edema, or other acute intrathoracic process.  Official radiology report(s): DG Chest 2 View  Result Date: 11/07/2019 CLINICAL DATA:  Chest pressure.  Lightheadedness. EXAM: CHEST - 2 VIEW COMPARISON:  07/10/2017 FINDINGS: Mild hyperinflation. Prior median sternotomy. Midline trachea. Mild cardiomegaly. Atherosclerosis in the transverse aorta. No pleural effusion or pneumothorax. Bibasilar scarring. IMPRESSION: No acute cardiopulmonary disease. Hyperinflation and mild bibasilar scarring. Aortic Atherosclerosis (ICD10-I70.0). Electronically Signed   By: Abigail Miyamoto M.D.   On: 11/07/2019 05:57    ____________________________________________   PROCEDURES  Procedure(s) performed (including Critical Care):  Procedures   ____________________________________________   INITIAL IMPRESSION / ASSESSMENT AND PLAN / ED COURSE        Patient presents with [place] to history exam for assessment of some chest pain that woke him from sleep but resolved prior to arrival. On arrival patient is slightly hypertensive with a BP of 183/70 with otherwise stable vital signs on room air.  Differential includes but is not limited to ACS, PE, pneumonia, thorax, GERD, anemia, metabolic derangements. Low suspicion for acute traumatic injury or cellulitis at this time.  CMP remarkable for creatinine of 2.21 which was compared to that of 1.54 obtained 2 years ago although it seems patient has been fluctuating between 1.5 and 2 over the last several years. No other significant left letter metabolic derangements. CBC shows hemoglobin of 11.7 which is mildly anemic but at patient's baseline  and no other significant derangements. BNP is slightly elevated at 286 although patient does not appear significantly volume overloaded exam on chest x-ray. Ortho card the patient's risk for ACS he does have multiple risk factors although he denies any chest pain on presentation and while he does have some nonspecific findings on his EKG he has 2 troponins that are stable over 2 hours and will suspicion for ACS at this time. I will plan to obtain a CTA chest given patient's D-dimer is significantly elevated to assess for evidence of PE. If this is negative I believe patient safe for discharge with plan for continued outpatient evaluation and follow-up.  Plan is to instruct patient to hold his Metformin for 2 days and have his kidney function rechecked in 2 days. ____________________________________________   FINAL CLINICAL IMPRESSION(S) / ED DIAGNOSES  Final diagnoses:  Chest pain, unspecified type  Positive D dimer  AKI (acute kidney injury) ([place])    Medications  sodium chloride flush (NS) 0.9 % injection 10-40 mL (has no administration in time range)  sodium chloride flush (NS) 0.9 % injection 10-40 mL (has no administration in time range)  lactated ringers bolus 500 mL (500 mLs Intravenous New Bag/Given 11/07/19 0558)  iohexol (OMNIPAQUE) 350 MG/ML injection 60 mL (60 mLs Intravenous Contrast Given 11/07/19 0720)     ED Discharge Orders    None       Note:  This document was prepared using Dragon voice recognition software and may include unintentional dictation errors.   Lucrezia Starch, MD 11/07/19 [phone]

## 2019-11-07 NOTE — ED Notes (Signed)
Sister, [name],  Called asking for update on pt. Pt gave verbal permission to give sister update via telephone. Sister states she will be coming to the ER shortly to bring pt clothing.

## 2019-11-07 NOTE — ED Notes (Signed)
Pt provided with urinal again. Urinal at side in bed.

## 2019-11-07 NOTE — ED Provider Notes (Signed)
Asked to follow-up on CT scan and second troponin, no significant change in troponin, CT scan negative for PE.  Patient remains asymptomatic, will discharge with close follow-up with cardiology, return precautions discussed.   Lavonia Drafts, MD 11/07/19 [phone]

## 2019-11-07 NOTE — ED Triage Notes (Signed)
Pt lives independently at [facility]. Pt states awoke this am with chest pressure. Pt took 324mg  asa. Pt states pain is currently gone. Pt states felt lightheaded with pain. Pt with swelling noted to hands, feet, 1+.

## 2019-12-10 ENCOUNTER — Ambulatory Visit (INDEPENDENT_AMBULATORY_CARE_PROVIDER_SITE_OTHER): Payer: Medicare Other | Admitting: Podiatry

## 2019-12-10 ENCOUNTER — Encounter: Payer: Self-pay | Admitting: Podiatry

## 2019-12-10 ENCOUNTER — Other Ambulatory Visit: Payer: Self-pay

## 2019-12-10 DIAGNOSIS — M79609 Pain in unspecified limb: Secondary | ICD-10-CM

## 2019-12-10 DIAGNOSIS — D689 Coagulation defect, unspecified: Secondary | ICD-10-CM

## 2019-12-10 DIAGNOSIS — B351 Tinea unguium: Secondary | ICD-10-CM | POA: Diagnosis not present

## 2019-12-10 DIAGNOSIS — E1142 Type 2 diabetes mellitus with diabetic polyneuropathy: Secondary | ICD-10-CM

## 2019-12-10 NOTE — Progress Notes (Signed)
This patient returns to my office for at risk foot care.  This patient requires this care by a professional since this patient will be at risk due to having chronic kidney disease and diabetes.  This patient is unable to cut nails himself since the patient cannot reach his nails.These nails are painful walking and wearing shoes.  This patient presents for at risk foot care today.  General Appearance  Alert, conversant and in no acute stress.  Vascular  Dorsalis pedis and posterior tibial  pulses are palpable  bilaterally.  Capillary return is within normal limits  bilaterally. Temperature is within normal limits  bilaterally.  Neurologic  Senn-Weinstein monofilament wire test within normal limits  bilaterally. Muscle power within normal limits bilaterally.  Nails Thick disfigured discolored nails with subungual debris  from hallux to fifth toes bilaterally. No evidence of bacterial infection or drainage bilaterally.  Orthopedic  No limitations of motion  feet .  No crepitus or effusions noted.  No bony pathology or digital deformities noted.  Skin  normotropic skin with no porokeratosis noted bilaterally.  No signs of infections or ulcers noted.     Onychomycosis  Pain in right toes  Pain in left toes  Consent was obtained for treatment procedures.   Mechanical debridement of nails 1-5  bilaterally performed with a nail nipper.  Filed with dremel without incident.    Return office visit   4 months                  Told patient to return for periodic foot care and evaluation due to potential at risk complications.   Gardiner Barefoot DPM

## 2019-12-14 ENCOUNTER — Ambulatory Visit: Payer: Medicare Other | Admitting: Podiatry

## 2019-12-25 ENCOUNTER — Other Ambulatory Visit: Payer: Self-pay

## 2019-12-25 ENCOUNTER — Inpatient Hospital Stay
Admission: EM | Admit: 2019-12-25 | Discharge: 2019-12-30 | DRG: 378 | Disposition: A | Discharge status: Home Health | Payer: Medicare Other | Attending: Internal Medicine | Admitting: Internal Medicine

## 2019-12-25 DIAGNOSIS — Z833 Family history of diabetes mellitus: Secondary | ICD-10-CM

## 2019-12-25 DIAGNOSIS — I152 Hypertension secondary to endocrine disorders: Secondary | ICD-10-CM | POA: Diagnosis present

## 2019-12-25 DIAGNOSIS — Z953 Presence of xenogenic heart valve: Secondary | ICD-10-CM | POA: Diagnosis not present

## 2019-12-25 DIAGNOSIS — E1151 Type 2 diabetes mellitus with diabetic peripheral angiopathy without gangrene: Secondary | ICD-10-CM | POA: Diagnosis present

## 2019-12-25 DIAGNOSIS — E1159 Type 2 diabetes mellitus with other circulatory complications: Secondary | ICD-10-CM | POA: Diagnosis present

## 2019-12-25 DIAGNOSIS — I255 Ischemic cardiomyopathy: Secondary | ICD-10-CM | POA: Diagnosis present

## 2019-12-25 DIAGNOSIS — Z8719 Personal history of other diseases of the digestive system: Secondary | ICD-10-CM

## 2019-12-25 DIAGNOSIS — K219 Gastro-esophageal reflux disease without esophagitis: Secondary | ICD-10-CM | POA: Diagnosis present

## 2019-12-25 DIAGNOSIS — E1169 Type 2 diabetes mellitus with other specified complication: Secondary | ICD-10-CM | POA: Diagnosis present

## 2019-12-25 DIAGNOSIS — I130 Hypertensive heart and chronic kidney disease with heart failure and stage 1 through stage 4 chronic kidney disease, or unspecified chronic kidney disease: Secondary | ICD-10-CM | POA: Diagnosis present

## 2019-12-25 DIAGNOSIS — E1122 Type 2 diabetes mellitus with diabetic chronic kidney disease: Secondary | ICD-10-CM | POA: Diagnosis present

## 2019-12-25 DIAGNOSIS — Z79899 Other long term (current) drug therapy: Secondary | ICD-10-CM

## 2019-12-25 DIAGNOSIS — K922 Gastrointestinal hemorrhage, unspecified: Secondary | ICD-10-CM | POA: Diagnosis present

## 2019-12-25 DIAGNOSIS — K921 Melena: Principal | ICD-10-CM | POA: Diagnosis present

## 2019-12-25 DIAGNOSIS — I5042 Chronic combined systolic (congestive) and diastolic (congestive) heart failure: Secondary | ICD-10-CM | POA: Diagnosis present

## 2019-12-25 DIAGNOSIS — I5022 Chronic systolic (congestive) heart failure: Secondary | ICD-10-CM | POA: Diagnosis not present

## 2019-12-25 DIAGNOSIS — Z794 Long term (current) use of insulin: Secondary | ICD-10-CM | POA: Diagnosis not present

## 2019-12-25 DIAGNOSIS — N184 Chronic kidney disease, stage 4 (severe): Secondary | ICD-10-CM | POA: Diagnosis present

## 2019-12-25 DIAGNOSIS — Z7982 Long term (current) use of aspirin: Secondary | ICD-10-CM | POA: Diagnosis not present

## 2019-12-25 DIAGNOSIS — I2510 Atherosclerotic heart disease of native coronary artery without angina pectoris: Secondary | ICD-10-CM | POA: Diagnosis present

## 2019-12-25 DIAGNOSIS — Z7902 Long term (current) use of antithrombotics/antiplatelets: Secondary | ICD-10-CM | POA: Diagnosis not present

## 2019-12-25 DIAGNOSIS — Z20822 Contact with and (suspected) exposure to covid-19: Secondary | ICD-10-CM | POA: Diagnosis present

## 2019-12-25 DIAGNOSIS — Z951 Presence of aortocoronary bypass graft: Secondary | ICD-10-CM

## 2019-12-25 DIAGNOSIS — K5730 Diverticulosis of large intestine without perforation or abscess without bleeding: Secondary | ICD-10-CM | POA: Diagnosis present

## 2019-12-25 DIAGNOSIS — Z8673 Personal history of transient ischemic attack (TIA), and cerebral infarction without residual deficits: Secondary | ICD-10-CM

## 2019-12-25 DIAGNOSIS — Z7984 Long term (current) use of oral hypoglycemic drugs: Secondary | ICD-10-CM

## 2019-12-25 DIAGNOSIS — Z8042 Family history of malignant neoplasm of prostate: Secondary | ICD-10-CM

## 2019-12-25 DIAGNOSIS — Z811 Family history of alcohol abuse and dependence: Secondary | ICD-10-CM

## 2019-12-25 DIAGNOSIS — D62 Acute posthemorrhagic anemia: Secondary | ICD-10-CM | POA: Diagnosis present

## 2019-12-25 DIAGNOSIS — E7800 Pure hypercholesterolemia, unspecified: Secondary | ICD-10-CM | POA: Diagnosis present

## 2019-12-25 DIAGNOSIS — E7849 Other hyperlipidemia: Secondary | ICD-10-CM | POA: Diagnosis present

## 2019-12-25 DIAGNOSIS — E119 Type 2 diabetes mellitus without complications: Secondary | ICD-10-CM

## 2019-12-25 DIAGNOSIS — K635 Polyp of colon: Secondary | ICD-10-CM | POA: Diagnosis present

## 2019-12-25 DIAGNOSIS — Z87891 Personal history of nicotine dependence: Secondary | ICD-10-CM

## 2019-12-25 DIAGNOSIS — Z91013 Allergy to seafood: Secondary | ICD-10-CM

## 2019-12-25 DIAGNOSIS — Z8249 Family history of ischemic heart disease and other diseases of the circulatory system: Secondary | ICD-10-CM

## 2019-12-25 DIAGNOSIS — I252 Old myocardial infarction: Secondary | ICD-10-CM

## 2019-12-25 DIAGNOSIS — Z841 Family history of disorders of kidney and ureter: Secondary | ICD-10-CM

## 2019-12-25 DIAGNOSIS — Z888 Allergy status to other drugs, medicaments and biological substances status: Secondary | ICD-10-CM

## 2019-12-25 LAB — CBC
HCT: 33.3 % — ABNORMAL LOW (ref 39.0–52.0)
Hemoglobin: 10.2 g/dL — ABNORMAL LOW (ref 13.0–17.0)
MCH: 27 pg (ref 26.0–34.0)
MCHC: 30.6 g/dL (ref 30.0–36.0)
MCV: 88.1 fL (ref 80.0–100.0)
Platelets: 136 10*3/uL — ABNORMAL LOW (ref 150–400)
RBC: 3.78 MIL/uL — ABNORMAL LOW (ref 4.22–5.81)
RDW: 14.3 % (ref 11.5–15.5)
WBC: 10 10*3/uL (ref 4.0–10.5)
nRBC: 0 % (ref 0.0–0.2)

## 2019-12-25 LAB — COMPREHENSIVE METABOLIC PANEL
ALT: 13 U/L (ref 0–44)
AST: 18 U/L (ref 15–41)
Albumin: 3.5 g/dL (ref 3.5–5.0)
Alkaline Phosphatase: 82 U/L (ref 38–126)
Anion gap: 9 (ref 5–15)
BUN: 55 mg/dL — ABNORMAL HIGH (ref 8–23)
CO2: 26 mmol/L (ref 22–32)
Calcium: 9.2 mg/dL (ref 8.9–10.3)
Chloride: 107 mmol/L (ref 98–111)
Creatinine, Ser: 2.33 mg/dL — ABNORMAL HIGH (ref 0.61–1.24)
GFR, Estimated: 27 mL/min — ABNORMAL LOW (ref >60)
Glucose, Bld: 142 mg/dL — ABNORMAL HIGH (ref 70–99)
Potassium: 4.6 mmol/L (ref 3.5–5.1)
Sodium: 142 mmol/L (ref 135–145)
Total Bilirubin: 0.4 mg/dL (ref 0.3–1.2)
Total Protein: 7.5 g/dL (ref 6.5–8.1)

## 2019-12-25 LAB — PROTIME-INR
INR: 1 (ref 0.8–1.2)
Prothrombin Time: 13.2 seconds (ref 11.4–15.2)

## 2019-12-25 LAB — HEMOGLOBIN AND HEMATOCRIT, BLOOD
HCT: 30.8 % — ABNORMAL LOW (ref 39.0–52.0)
Hemoglobin: 9.3 g/dL — ABNORMAL LOW (ref 13.0–17.0)

## 2019-12-25 MED ORDER — ACETAMINOPHEN 650 MG RE SUPP: 650.0000 mg | Freq: Four times a day (QID) | RECTAL | Status: DC | PRN

## 2019-12-25 MED ORDER — ONDANSETRON HCL 4 MG/2ML IJ SOLN: 4.0000 mg | Freq: Four times a day (QID) | INTRAMUSCULAR | Status: DC | PRN

## 2019-12-25 MED ORDER — ONDANSETRON HCL 4 MG PO TABS: 4.0000 mg | ORAL_TABLET | Freq: Four times a day (QID) | ORAL | Status: DC | PRN

## 2019-12-25 MED ORDER — SODIUM CHLORIDE 0.9 % IV SOLN: INTRAVENOUS | Status: DC

## 2019-12-25 MED ORDER — INSULIN ASPART 100 UNIT/ML ~~LOC~~ SOLN
0.0000 [IU] | SUBCUTANEOUS | Status: DC
  Administered 2019-12-26: 8 [IU] via SUBCUTANEOUS
  Administered 2019-12-26: 2 [IU] via SUBCUTANEOUS
  Administered 2019-12-26: 8 [IU] via SUBCUTANEOUS
  Administered 2019-12-26 – 2019-12-27 (×2): 3 [IU] via SUBCUTANEOUS
  Administered 2019-12-27: 2 [IU] via SUBCUTANEOUS
  Administered 2019-12-27 (×2): 3 [IU] via SUBCUTANEOUS
  Administered 2019-12-28 (×2): 2 [IU] via SUBCUTANEOUS
  Administered 2019-12-28: 8 [IU] via SUBCUTANEOUS
  Administered 2019-12-28: 2 [IU] via SUBCUTANEOUS
  Administered 2019-12-29: 5 [IU] via SUBCUTANEOUS
  Filled 2019-12-25 (×13): qty 1

## 2019-12-25 MED ORDER — ACETAMINOPHEN 325 MG PO TABS: 650.0000 mg | ORAL_TABLET | Freq: Four times a day (QID) | ORAL | Status: DC | PRN

## 2019-12-25 MED ORDER — MORPHINE SULFATE (PF) 2 MG/ML IV SOLN: 2.0000 mg | INTRAVENOUS | Status: DC | PRN

## 2019-12-25 NOTE — H&P (Signed)
History and Physical    Ryan Mcclure ZDG:[id number] DOB: [birth year] DOA: 12/25/2019  PCP: Maryland Pink, MD   Patient coming from: ALF  I have personally briefly reviewed patient's old medical records in [place]  Chief Complaint: Rectal bleeding  HPI: Ryan Mcclure is a 86 y.o. male with medical history significant for recurrent diverticular bleed requiring blood transfusions, CAD status post CABG, systolic heart failure secondary to ischemic cardiomyopathy, diabetes, CKD 4, HTN, history of AVR with bioprosthetic valve, PVD and history of CVA with history of bilateral carotid artery endarterectomy who presents to the emergency room with a 2-day history of rectal bleeding. Started with intermittent bright red blood in the stool and over the course of the past 24 hours has had 5 episodes of rectal bleeding with increasing amounts of blood including 2 large bloody bowel movements since arrival in the emergency room. He denies abdominal pain, nausea or vomiting. Denies lightheadedness, chest pain, shortness of breath or palpitations. ED Course: On arrival he was hemodynamically stable with heart rate 69 and blood pressure 143/58, O2 sats 100% on room air. Initial hemoglobin 10.2 trending down to 9.3 after 6 hours in the ER. Other labs significant for creatinine of 2.33, about the same as 2.21 a month prior EKG as reviewed by me : NSR with no acute ST-T wave changes Patient typed and crossed. Hospitalist consulted for admission.  Review of Systems: As per HPI otherwise all other systems on review of systems negative.    Past Medical History:  Diagnosis Date  . Chronic systolic CHF (congestive heart failure) ([place])   . CKD (chronic kidney disease), stage III ([place])   . Diabetes ([place])   . HBP (high blood pressure)   . Heart attack ([place])   . High cholesterol   . Stroke (cerebrum) ([place])   . Swelling     Past Surgical History:  Procedure Laterality Date  . COLONOSCOPY N/A 10/21/2015    Procedure: COLONOSCOPY;  Surgeon: Milus Banister, MD;  Location: [facility];  Service: Endoscopy;  Laterality: N/A;  . EXPLORATION POST OPERATIVE OPEN HEART    . EYE SURGERY Bilateral      reports that he quit smoking about 24 years ago. His smoking use included cigarettes. He has never used smokeless tobacco. He reports that he does not drink alcohol and does not use drugs.  Allergies  Allergen Reactions  . Neuromuscular Blocking Agents Nausea And Vomiting and Other (See Comments)    Tongue swelling, lips swelling   . Other Other (See Comments)    Tongue/lips swelling with steroid dose pack, pt does not recall exact name.   . Shrimp [Shellfish Allergy]   . Amlodipine Nausea And Vomiting    Family History  Problem Relation Age of Onset  . Diabetes Mellitus I Mother   . Kidney failure Mother   . Alcoholism Father   . Heart attack Sister   . Prostate cancer Brother       Prior to Admission medications   Medication Sig Start Date End Date Taking? Authorizing Provider  amLODipine (NORVASC) 2.5 MG tablet Take 2.5 mg by mouth 2 (two) times daily. 12/07/19   [provider]  atorvastatin (LIPITOR) 40 MG tablet Take 40 mg by mouth daily.    [provider]  blood glucose meter kit and supplies KIT Dispense based on patient and insurance preference. Use up to four times daily as directed. (FOR ICD-9 250.00, 250.01). 06/21/17   Harvest Dark, MD  clopidogrel (  PLAVIX) 75 MG tablet Take by mouth. 11/11/17   [provider]  Cyanocobalamin (RA VITAMIN B-12 TR) 1000 MCG TBCR Take 1 tablet by mouth daily.     [provider]  Cysteamine Bitartrate (PROCYSBI) 300 MG PACK Please provide patient with One Touch meter kit. E11.29 11/16/19   [provider]  Elastic Bandages & Supports (MEDICAL COMPRESSION STOCKINGS) MISC Please provide 59mHg compression stockings 06/21/17   PHarvest Dark MD  ferrous sulfate 325 (65 FE) MG tablet TAKE 1  TABLET BY MOUTH THREE TIMES DAILY WITH MEALS 12/23/17   [provider]  fluticasone (FLONASE) 50 MCG/ACT nasal spray Place 1 spray into both nostrils daily as needed for allergies.  06/03/13   [provider]  furosemide (LASIX) 20 MG tablet Take 40 mg by mouth 2 (two) times daily. 10/11/17   [provider]  glucose blood (PRECISION QID TEST) test strip 1 each (1 strip total) by XX route 4 (four) times daily Use one strip to test 4 times daily. E11.29 11/16/19 12/20/20  [provider]  insulin aspart (NOVOLOG) 100 UNIT/ML FlexPen Take 8 units with breakfast, 12 units with lunch, 16 units with supper. MDD 50 units. 10/03/17   [provider]  Insulin Glargine (BASAGLAR KWIKPEN) 100 UNIT/ML Inject into the skin. 11/02/19   [provider]  Lancets 28G MISC Use 1 each 4 (four) times daily Use as instructed. 10/18/17   [provider]  lisinopril (PRINIVIL,ZESTRIL) 5 MG tablet Take by mouth. 10/16/17   [provider]  lisinopril (ZESTRIL) 10 MG tablet TK 1 T PO ONCE D 04/12/18   [provider]  metFORMIN (GLUCOPHAGE) 500 MG tablet Take 500 mg by mouth 2 (two) times daily. 07/23/17   [provider]  metoprolol tartrate (LOPRESSOR) 50 MG tablet TAKE 1 TABLET BY MOUTH TWICE DAILY 11/18/17   [provider]  nitroGLYCERIN (NITROSTAT) 0.4 MG SL tablet take 1 tablet under the tongue every 5 minutes if needed for chest pain 10/29/14   [provider]  omeprazole (PRILOSEC) 40 MG capsule TAKE 1 CAPSULE BY MOUTH TWICE DAILY 01/06/19   [provider]  RA ASPIRIN EC ADULT LOW ST 81 MG EC tablet Take 1 tablet (81 mg total) by mouth daily. 11/02/15   BKinnie Feil MD  telmisartan (MICARDIS) 80 MG tablet Take 1 tablet by mouth daily. 12/01/19   [provider]    Physical Exam: Vitals:   12/25/19 1723 12/25/19 2113 12/25/19 2230 12/25/19 2300  BP: (!) 143/58 (!) 143/63 (!) 161/74 122/67    Pulse: 69 72 74 71  Resp: 18 16 20 19   Temp: 98.2 F (36.8 C) 98.5 F (36.9 C)    TempSrc:  Oral    SpO2: 100% 95% 98% 97%  Weight: 90.7 kg     Height: 5' 7"  (1.702 m)        Vitals:   12/25/19 1723 12/25/19 2113 12/25/19 2230 12/25/19 2300  BP: (!) 143/58 (!) 143/63 (!) 161/74 122/67  Pulse: 69 72 74 71  Resp: 18 16 20 19   Temp: 98.2 F (36.8 C) 98.5 F (36.9 C)    TempSrc:  Oral    SpO2: 100% 95% 98% 97%  Weight: 90.7 kg     Height: 5' 7"  (1.702 m)         Constitutional: Alert and oriented x 3 . Not in any apparent distress HEENT:      Head: Normocephalic and atraumatic.  Eyes: PERLA, EOMI, Conjunctivae are normal. Sclera is non-icteric.       Mouth/Throat: Mucous membranes are moist.       Neck: Supple with no signs of meningismus. Cardiovascular: Regular rate and rhythm. No murmurs, gallops, or rubs. 2+ symmetrical distal pulses are present . No JVD. No LE edema Respiratory: Respiratory effort normal .Lungs sounds clear bilaterally. No wheezes, crackles, or rhonchi.  Gastrointestinal: Soft, non tender, and non distended with positive bowel sounds. No rebound or guarding. Genitourinary: No CVA tenderness. Musculoskeletal: Nontender with normal range of motion in all extremities. No cyanosis, or erythema of extremities. Neurologic:  Face is symmetric. Moving all extremities. No gross focal neurologic deficits . Skin: Skin is warm, dry.  No rash or ulcers Psychiatric: Mood and affect are normal    Labs on Admission: I have personally reviewed following labs and imaging studies  CBC: Recent Labs  Lab 12/25/19 1731 12/25/19 2315  WBC 10.0  --   HGB 10.2* 9.3*  HCT 33.3* 30.8*  MCV 88.1  --   PLT 136*  --    Basic Metabolic Panel: Recent Labs  Lab 12/25/19 1731  NA 142  K 4.6  CL 107  CO2 26  GLUCOSE 142*  BUN 55*  CREATININE 2.33*  CALCIUM 9.2   GFR: Estimated Creatinine Clearance: 24.9 mL/min (A) (by C-G formula based on SCr of 2.33  mg/dL (H)). Liver Function Tests: Recent Labs  Lab 12/25/19 1731  AST 18  ALT 13  ALKPHOS 82  BILITOT 0.4  PROT 7.5  ALBUMIN 3.5   No results for input(s): LIPASE, AMYLASE in the last 168 hours. No results for input(s): AMMONIA in the last 168 hours. Coagulation Profile: Recent Labs  Lab 12/25/19 1731  INR 1.0   Cardiac Enzymes: No results for input(s): CKTOTAL, CKMB, CKMBINDEX, TROPONINI in the last 168 hours. BNP (last 3 results) No results for input(s): PROBNP in the last 8760 hours. HbA1C: No results for input(s): HGBA1C in the last 72 hours. CBG: No results for input(s): GLUCAP in the last 168 hours. Lipid Profile: No results for input(s): CHOL, HDL, LDLCALC, TRIG, CHOLHDL, LDLDIRECT in the last 72 hours. Thyroid Function Tests: No results for input(s): TSH, T4TOTAL, FREET4, T3FREE, THYROIDAB in the last 72 hours. Anemia Panel: No results for input(s): VITAMINB12, FOLATE, FERRITIN, TIBC, IRON, RETICCTPCT in the last 72 hours. Urine analysis:    Component Value Date/Time   COLORURINE STRAW (A) 08/15/2017 1037   APPEARANCEUR CLEAR (A) 08/15/2017 1037   APPEARANCEUR Clear 07/11/2012 2124   LABSPEC 1.006 08/15/2017 1037   LABSPEC 1.013 07/11/2012 2124   PHURINE 7.0 08/15/2017 1037   GLUCOSEU NEGATIVE 08/15/2017 1037   GLUCOSEU Negative 07/11/2012 2124   HGBUR NEGATIVE 08/15/2017 1037   BILIRUBINUR NEGATIVE 08/15/2017 1037   BILIRUBINUR Negative 07/11/2012 2124   KETONESUR NEGATIVE 08/15/2017 1037   PROTEINUR NEGATIVE 08/15/2017 1037   NITRITE NEGATIVE 08/15/2017 1037   LEUKOCYTESUR NEGATIVE 08/15/2017 1037   LEUKOCYTESUR Negative 07/11/2012 2124    Radiological Exams on Admission: No results found.   Assessment/Plan 86 year old male with history of diverticular bleed in 2017 requiring blood transfusions, CAD status post CABG, systolic heart failure secondary to ischemic cardiomyopathy, diabetes, CKD 4, HTN, history of AVR with bioprosthetic valve, PVD and  history of CVA with history of bilateral carotid artery endarterectomy presenting with a 2-day history of rectal bleeding.    Hematochezia/lower GI bleed   History of recurrent GI bleed    Acute blood loss anemia -Patient with  history of diverticular bleed presenting with 2-day history of bright red blood per rectum and downward trending hemoglobin -Patient hemodynamically stable -Hemoglobin 10.2 >>9.3. Hemoglobin 1 month prior was 11.7 -Consider nuclear bleeding scan. Unable to get CT angio due to creatinine of 2.33 -Continue to monitor H&H every 6 and transfuse if under seven-point greater than two-point drop in 6 hours -Patient is currently hemodynamically stable -N.p.o. 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 Nehemiah Massed. -Last echo on chart review 2019 with EF 60 to 65%    Hypertension associated with diabetes ([place]) -Holding home antihypertensives mentioned above as well as amlodipine for now and resume as appropriate    CKD stage 4 due to type 2 diabetes mellitus ([place]) -Creatinine 2.33. Was 2.21 a month prior -Hold lisinopril and Metformin    CAD with history of CABG (coronary artery disease) -No complaints of chest pain and EKG nonacute -Nitroglycerin sublingual as needed chest pain -Hold Plavix for now. Also holding metoprolol    History of CVA without residual deficit (cerebrovascular accident) History of carotid endarterectomy -Holding Plavix. Will hold atorvastatin while n.p.o.    History of aortic valve replacement with bioprosthetic valve -No acute disease    DVT prophylaxis: SCDs Code Status: full code  Family Communication:  none  Disposition Plan: Back to previous home  environment Consults called: GI Status:At the time of admission, it appears that the appropriate admission status for this patient is INPATIENT. This is judged to be reasonable and necessary in order to provide the required intensity of service to ensure the patient's safety given the presenting symptoms, physical exam findings, and initial radiographic and laboratory data in the context of their  Comorbid conditions.   Patient requires inpatient status due to high intensity of service, high risk for further deterioration and high frequency of surveillance required.   I certify that at the point of admission it is my clinical judgment that the patient will require inpatient hospital care spanning beyond [place] MD [facility]     12/25/2019, 11:46 PM

## 2019-12-25 NOTE — ED Provider Notes (Signed)
[facility] Emergency Department Provider Note  ____________________________________________  Time seen: Approximately 11:12 PM  I have reviewed the triage vital signs and the nursing notes.   HISTORY  Chief Complaint Rectal Bleeding   HPI Ryan Mcclure is a 86 y.o. male history of diverticular disease, CKD, CAD status post CABG, CVA on Plavix, hypertension, diabetes, CHF who presents for evaluation of rectal bleeding.  Patient has had 4-5 stools in the last 24 hours with blood.  No melena, no abdominal pain, no vomiting, no hematemesis, no nausea, no dizziness, no chest pain.  Patient reports having prior episodes of GI bleed however never this severe.   Past Medical History:  Diagnosis Date  . Chronic systolic CHF (congestive heart failure) ([place])   . CKD (chronic kidney disease), stage III ([place])   . Diabetes ([place])   . HBP (high blood pressure)   . Heart attack ([place])   . High cholesterol   . Stroke (cerebrum) ([place])   . Swelling     Patient Active Problem List   Diagnosis Date Noted  . CKD stage 4 due to type 2 diabetes mellitus ([place]) 12/25/2019  . History of GI diverticular bleed 12/25/2019  . History of CVA (cerebrovascular accident) 12/25/2019  . History of aortic valve replacement with bioprosthetic valve 12/25/2019  . Cerebrovascular accident (CVA) due to occlusion of left posterior cerebral artery ([place]) 11/20/2017  . Hyperlipidemia due to type 2 diabetes mellitus ([place]) 09/26/2017  . Ulcer 09/26/2017  . Stroke ([place]) 08/15/2017  . Carotid stenosis 01/31/2016  . Hematochezia   . Diverticulosis of large intestine without hemorrhage   . Hemorrhoid   . GIB (gastrointestinal bleeding) 10/18/2015  . Acute blood loss anemia 10/18/2015  . CKD (chronic kidney disease), stage III ([place])   . Chronic systolic CHF (congestive heart failure) ([place])   . Stroke (cerebrum) ([place])   . Cardiomyopathy, ischemic 10/12/2015  . Shortness of breath 09/14/2015  .  Absolute anemia 06/10/2015  . Cataract cortical, senile 06/10/2015  . Controlled type 2 diabetes mellitus without complication ([place]) 01/74/9449  . Acid reflux 06/10/2015  . Combined fat and carbohydrate induced hyperlipemia 06/10/2015  . Cerebrovascular accident (CVA) ([place]) 06/10/2015  . Non-pressure chronic ulcer of skin of other sites with unspecified severity ([place]) 06/10/2015  . Benign essential HTN 08/25/2014  . Hypertension associated with diabetes ([place]) 08/25/2014  . Arteriosclerosis of coronary artery 09/09/2013  . CAD (coronary artery disease) 09/09/2013  . Microalbuminuria 09/08/2013  . Diabetes mellitus ([place]) 09/08/2013    Past Surgical History:  Procedure Laterality Date  . COLONOSCOPY N/A 10/21/2015   Procedure: COLONOSCOPY;  Surgeon: Milus Banister, MD;  Location: [facility];  Service: Endoscopy;  Laterality: N/A;  . EXPLORATION POST OPERATIVE OPEN HEART    . EYE SURGERY Bilateral     Prior to Admission medications   Medication Sig Start Date End Date Taking? Authorizing Provider  amLODipine (NORVASC) 2.5 MG tablet Take 2.5 mg by mouth 2 (two) times daily. 12/07/19   [provider]  atorvastatin (LIPITOR) 40 MG tablet Take 40 mg by mouth daily.    [provider]  blood glucose meter kit and supplies KIT Dispense based on patient and insurance preference. Use up to four times daily as directed. (FOR ICD-9 250.00, 250.01). 06/21/17   Harvest Dark, MD  clopidogrel (PLAVIX) 75 MG tablet Take by mouth. 11/11/17   [provider]  Cyanocobalamin (RA VITAMIN B-12 TR) 1000 MCG TBCR Take 1 tablet by mouth daily.  [provider]  Cysteamine Bitartrate (PROCYSBI) 300 MG PACK Please provide patient with One Touch meter kit. E11.29 11/16/19   [provider]  Elastic Bandages & Supports (MEDICAL COMPRESSION STOCKINGS) MISC Please provide 50mHg compression stockings 06/21/17   PHarvest Dark MD  ferrous sulfate 325 (65 FE)  MG tablet TAKE 1 TABLET BY MOUTH THREE TIMES DAILY WITH MEALS 12/23/17   [provider]  fluticasone (FLONASE) 50 MCG/ACT nasal spray Place 1 spray into both nostrils daily as needed for allergies.  06/03/13   [provider]  furosemide (LASIX) 20 MG tablet Take 40 mg by mouth 2 (two) times daily. 10/11/17   [provider]  glucose blood (PRECISION QID TEST) test strip 1 each (1 strip total) by XX route 4 (four) times daily Use one strip to test 4 times daily. E11.29 11/16/19 12/20/20  [provider]  insulin aspart (NOVOLOG) 100 UNIT/ML FlexPen Take 8 units with breakfast, 12 units with lunch, 16 units with supper. MDD 50 units. 10/03/17   [provider]  Insulin Glargine (BASAGLAR KWIKPEN) 100 UNIT/ML Inject into the skin. 11/02/19   [provider]  Lancets 28G MISC Use 1 each 4 (four) times daily Use as instructed. 10/18/17   [provider]  lisinopril (PRINIVIL,ZESTRIL) 5 MG tablet Take by mouth. 10/16/17   [provider]  lisinopril (ZESTRIL) 10 MG tablet TK 1 T PO ONCE D 04/12/18   [provider]  metFORMIN (GLUCOPHAGE) 500 MG tablet Take 500 mg by mouth 2 (two) times daily. 07/23/17   [provider]  metoprolol tartrate (LOPRESSOR) 50 MG tablet TAKE 1 TABLET BY MOUTH TWICE DAILY 11/18/17   [provider]  nitroGLYCERIN (NITROSTAT) 0.4 MG SL tablet take 1 tablet under the tongue every 5 minutes if needed for chest pain 10/29/14   [provider]  omeprazole (PRILOSEC) 40 MG capsule TAKE 1 CAPSULE BY MOUTH TWICE DAILY 01/06/19   [provider]  RA ASPIRIN EC ADULT LOW ST 81 MG EC tablet Take 1 tablet (81 mg total) by mouth daily. 11/02/15   BKinnie Feil MD  telmisartan (MICARDIS) 80 MG tablet Take 1 tablet by mouth daily. 12/01/19   [provider]    Allergies Neuromuscular blocking agents, Other, Shrimp [shellfish allergy], and Amlodipine  Family History    Problem Relation Age of Onset  . Diabetes Mellitus I Mother   . Kidney failure Mother   . Alcoholism Father   . Heart attack Sister   . Prostate cancer Brother     Social History Social History   Tobacco Use  . Smoking status: Former Smoker    Types: Cigarettes    Quit date: 01/30/1995    Years since quitting: 24.9  . Smokeless tobacco: Never Used  Vaping Use  . Vaping Use: Never used  Substance Use Topics  . Alcohol use: No  . Drug use: No    Review of Systems  Constitutional: Negative for fever. Eyes: Negative for visual changes. ENT: Negative for sore throat. Neck: No neck pain  Cardiovascular: Negative for chest pain. Respiratory: Negative for shortness of breath. Gastrointestinal: Negative for abdominal pain, vomiting or diarrhea. + rectal bleeding Genitourinary: Negative for dysuria. Musculoskeletal: Negative for back pain. Skin: Negative for rash. Neurological: Negative for headaches, weakness or numbness. Psych: No SI or HI  ____________________________________________   PHYSICAL EXAM:  VITAL SIGNS: ED Triage Vitals  Enc Vitals Group     BP 12/25/19 1723 (!) 143/58  Pulse Rate 12/25/19 1723 69     Resp 12/25/19 1723 18     Temp 12/25/19 1723 98.2 F (36.8 C)     Temp Source 12/25/19 2113 Oral     SpO2 12/25/19 1723 100 %     Weight 12/25/19 1723 200 lb (90.7 kg)     Height 12/25/19 1723 _0  (1.702 m)     Head Circumference --      Peak Flow --      Pain Score 12/25/19 1723 0     Pain Loc --      Pain Edu? --      Excl. in [place]? --     Constitutional: Alert and oriented. Well appearing and in no apparent distress. HEENT:      Head: Normocephalic and atraumatic.         Eyes: Conjunctivae are normal. Sclera is non-icteric.       Mouth/Throat: Mucous membranes are moist.       Neck: Supple with no signs of meningismus. Cardiovascular: Regular rate and rhythm. No murmurs, gallops, or rubs.  Respiratory: Normal respiratory effort. Lungs are  clear to auscultation bilaterally.  Gastrointestinal: Soft, non tender, and non distended. Genitourinary: Brown stool with large amount of blood, guaiac positive Musculoskeletal:  No edema, cyanosis, or erythema of extremities. Neurologic: Normal speech and language. Face is symmetric. Moving all extremities. No gross focal neurologic deficits are appreciated. Skin: Skin is warm, dry and intact. No rash noted. Psychiatric: Mood and affect are normal. Speech and behavior are normal.  ____________________________________________   LABS (all labs ordered are listed, but only abnormal results are displayed)  Labs Reviewed  COMPREHENSIVE METABOLIC PANEL - Abnormal; Notable for the following components:      Result Value   Glucose, Bld 142 (*)    BUN 55 (*)    Creatinine, Ser 2.33 (*)    GFR, Estimated 27 (*)    All other components within normal limits  CBC - Abnormal; Notable for the following components:   RBC 3.78 (*)    Hemoglobin 10.2 (*)    HCT 33.3 (*)    Platelets 136 (*)    All other components within normal limits  HEMOGLOBIN AND HEMATOCRIT, BLOOD - Abnormal; Notable for the following components:   Hemoglobin 9.3 (*)    HCT 30.8 (*)    All other components within normal limits  RESP PANEL BY RT-PCR (FLU A&B, COVID) ARPGX2  PROTIME-INR  HEMOGLOBIN B1D  BASIC METABOLIC PANEL  CBC  POC OCCULT BLOOD, ED  TYPE AND SCREEN   ____________________________________________  EKG  none  ____________________________________________  RADIOLOGY  none  ____________________________________________   PROCEDURES  Procedure(s) performed:yes .1-3 Lead EKG Interpretation Performed by: Rudene Re, MD Authorized by: Rudene Re, MD     Interpretation: non-specific     ECG rate assessment: normal     Rhythm: sinus rhythm     Ectopy: none     Critical Care performed:  None ____________________________________________   INITIAL IMPRESSION / ASSESSMENT AND  PLAN / ED COURSE  86 y.o. male history of diverticular disease, CKD, CAD status post CABG, CVA on Plavix, hypertension, diabetes, CHF who presents for evaluation of rectal bleeding.  Patient with 4 episodes of bright red blood per rectum.  One here in the emergency room with large amount of blood in the toilet.  Hemodynamically stable.  Hemoglobin at this point is stable at 10.2.  He is on Plavix.  Review of old medical records showed colonoscopy  from 2017 showing diverticular disease, internal and external hemorrhoids.  No history of varices or cirrhosis.  Unfortunately patient's creatinine is 2.33 with a GFR of 27 therefore will hold CT angio at this time. Will repeat H&H. Type and cross ready. Will consult Hospitalist for admission.   _________________________ 11:42 PM on 12/25/2019 -----------------------------------------  Repeat H&H from 10.2 to 9.3. Patient remains HD stable. This result was discussed with Dr. Damita Dunnings.      _____________________________________________ Please note:  Patient was evaluated in Emergency Department today for the symptoms described in the history of present illness. Patient was evaluated in the context of the global COVID-19 pandemic, which necessitated consideration that the patient might be at risk for infection with the SARS-CoV-2 virus that causes COVID-19. Institutional protocols and algorithms that pertain to the evaluation of patients at risk for COVID-19 are in a state of rapid change based on information released by regulatory bodies including the CDC and federal and state organizations. These policies and algorithms were followed during the patient's care in the ED.  Some ED evaluations and interventions may be delayed as a result of limited staffing during the pandemic.   [place] Controlled Substance Database was reviewed by me. ____________________________________________   FINAL CLINICAL IMPRESSION(S) / ED DIAGNOSES   Final diagnoses:  Lower GI bleed        NEW MEDICATIONS STARTED DURING THIS VISIT:  ED Discharge Orders    None       Note:  This document was prepared using Dragon voice recognition software and may include unintentional dictation errors.    Alfred Levins, [place], MD 12/26/19 0001

## 2019-12-25 NOTE — ED Triage Notes (Signed)
Pt comes into the ED via EMS from with c/o 2 episodes of bright red blood with brown stool today. On plavix, 120/80, 69, 97%RA.[place] Kitchen pt is a/ox3. Denies pain.

## 2019-12-25 NOTE — ED Triage Notes (Signed)
Pt comes via EMS with c/o bright red blood in stools for 2 days. Pt states he is on Plavis. Pt denies any belly pain or other complaints.

## 2019-12-25 NOTE — ED Notes (Signed)
Pts sister [name] updated on plan of care per pt request. [phone]

## 2019-12-25 NOTE — ED Notes (Signed)
Pt had a large dark red bloody stool in the bedside toilet. Pt in no other acute distress at this time. VSS. Will continue to monitor.

## 2019-12-25 NOTE — ED Notes (Signed)
Pt had another bloody bowel movement. No other changed in pt condition. Awaiting further orders. Will continue to monitor.

## 2019-12-26 DIAGNOSIS — I5022 Chronic systolic (congestive) heart failure: Secondary | ICD-10-CM

## 2019-12-26 LAB — HEMOGLOBIN A1C
Hgb A1c MFr Bld: 6.2 % — ABNORMAL HIGH (ref 4.8–5.6)
Mean Plasma Glucose: 131.24 mg/dL

## 2019-12-26 LAB — CBG MONITORING, ED
Glucose-Capillary: 119 mg/dL — ABNORMAL HIGH (ref 70–99)
Glucose-Capillary: 139 mg/dL — ABNORMAL HIGH (ref 70–99)
Glucose-Capillary: 170 mg/dL — ABNORMAL HIGH (ref 70–99)

## 2019-12-26 LAB — RESP PANEL BY RT-PCR (FLU A&B, COVID) ARPGX2
Influenza A by PCR: NEGATIVE
Influenza B by PCR: NEGATIVE
SARS Coronavirus 2 by RT PCR: NEGATIVE

## 2019-12-26 LAB — HEMOGLOBIN AND HEMATOCRIT, BLOOD
HCT: 28 % — ABNORMAL LOW (ref 39.0–52.0)
HCT: 31.9 % — ABNORMAL LOW (ref 39.0–52.0)
Hemoglobin: 8.6 g/dL — ABNORMAL LOW (ref 13.0–17.0)
Hemoglobin: 10.2 g/dL — ABNORMAL LOW (ref 13.0–17.0)

## 2019-12-26 LAB — GLUCOSE, CAPILLARY
Glucose-Capillary: 109 mg/dL — ABNORMAL HIGH (ref 70–99)
Glucose-Capillary: 255 mg/dL — ABNORMAL HIGH (ref 70–99)
Glucose-Capillary: 298 mg/dL — ABNORMAL HIGH (ref 70–99)

## 2019-12-26 LAB — HEMOGLOBIN: Hemoglobin: 8.7 g/dL — ABNORMAL LOW (ref 13.0–17.0)

## 2019-12-26 LAB — PREPARE RBC (CROSSMATCH)

## 2019-12-26 MED ORDER — SODIUM CHLORIDE 0.9 % IV SOLN
10.0000 mL/h | Freq: Once | INTRAVENOUS | Status: AC
  Administered 2019-12-26: 10 mL/h via INTRAVENOUS

## 2019-12-26 MED ORDER — SODIUM CHLORIDE 0.9% IV SOLUTION
Freq: Once | INTRAVENOUS | Status: AC
  Filled 2019-12-26: qty 250

## 2019-12-26 NOTE — ED Notes (Signed)
Pt provided with water and flavored ice per diet order change. Pt moved up in bed and sat upright. Warm blankets provided

## 2019-12-26 NOTE — ED Notes (Signed)
Pt assisted to pivot from bed to toilet with assistance, well tolerated

## 2019-12-26 NOTE — ED Notes (Signed)
Pt had another moderate sized maroon jelly stool

## 2019-12-26 NOTE — ED Notes (Signed)
Pt assisted to pivot from bed to toilet. Somewhat steady on feet

## 2019-12-26 NOTE — Progress Notes (Signed)
PROGRESS NOTE    Ryan Mcclure  XBD:[id number] DOB: [birth year] DOA: 12/25/2019 PCP: [place] Pink, MD    Brief Narrative:  [phone] with hx of CAD s/p CABG, ischemic cardiomyopathy, DM, CKD4, and prior hx of diverticular bleed who presents to the ED with one day hx of BRBPR. Hospitalist consulted for consideration for admission  Assessment & Plan:   Principal Problem:   Hematochezia Active Problems:   Diabetes mellitus ([place])   GIB (gastrointestinal bleeding)   Chronic systolic CHF (congestive heart failure) (HCC)   Acute blood loss anemia   Hypertension associated with diabetes ([place])   CKD stage 4 due to type 2 diabetes mellitus ([place])   History of GI diverticular bleed   CAD (coronary artery disease)   History of CVA (cerebrovascular accident)   History of aortic valve replacement with bioprosthetic valve     Hematochezia/lower GI bleed   History of recurrent GI bleed    Acute blood loss anemia -Patient with history of diverticular bleed presenting with history of bright red blood per rectum and downward trending hemoglobin -Patient hemodynamically stable -Hemoglobin is continuing to trend down despite receiving 2 units PRBC's on presentation. Hgb was 10.2 on presentation, currently 8.6 after 2 units of PRBC's -GI following. Rec to cont to hold plavix and for clear diet for now. 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 Nehemiah Massed. -Last echo on chart review 2019 with EF 60 to 65%    Hypertension associated with diabetes ([place]) -Holding home antihypertensives mentioned above as well as amlodipine for now and resume as appropriate    CKD stage 4 due to type 2 diabetes mellitus  ([place]) -Current Creatinine 2.33. Was 2.21 a month prior -Hold lisinopril and Metformin -Repeat bmet in AM    CAD with history of CABG (coronary artery disease) -No complaints of chest pain  -Nitroglycerin sublingual as needed chest pain -Hold Plavix for now secondary to blood loss anemia. Also holding metoprolol    History of CVA without residual deficit (cerebrovascular accident) History of carotid endarterectomy -Holding Plavix. Will hold atorvastatin while n.p.o.    History of aortic valve replacement with bioprosthetic valve -No acute disease, seems stable at this time   DVT prophylaxis: SCD's Code Status: Full Family Communication: Pt in room, family not at bedside  Status is: Inpatient  Remains inpatient appropriate because:Ongoing diagnostic testing needed not appropriate for outpatient work up, Unsafe d/c plan and Inpatient level of care appropriate due to severity of illness   Dispo: The patient is from: Home              Anticipated d/c is to: Home              Anticipated d/c date is: 2 days              Patient currently is not medically stable to d/c.       Consultants:   GI  Procedures:     Antimicrobials: Anti-infectives (From admission, onward)   None       Subjective: Denies abd pain or nausea  Objective: Vitals:   12/26/19 1300 12/26/19 1330 12/26/19 1400 12/26/19 1430  BP: (!) 135/99 132/81    Pulse: 94 85 79 76  Resp: 16  Temp:      TempSrc:      SpO2: 100% 100% 100% 98%  Weight:      Height:        Intake/Output Summary (Last 24 hours) at 12/26/2019 1449 Last data filed at 12/26/2019 1358 Gross per 24 hour  Intake 937 ml  Output 650 ml  Net 287 ml   Filed Weights   12/25/19 1723  Weight: 90.7 kg    Examination:  General exam: Appears calm and comfortable  Respiratory system: Clear to auscultation. Respiratory effort normal. Cardiovascular system: S1 & S2 heard, Regular Gastrointestinal system: Abdomen is  nondistended, soft and nontender. No organomegaly or masses felt. Normal bowel sounds heard. Central nervous system: Alert and oriented. No focal neurological deficits. Extremities: Symmetric 5 x 5 power. Skin: No rashes, lesions  Psychiatry: Judgement and insight appear normal. Mood & affect appropriate.   Data Reviewed: I have personally reviewed following labs and imaging studies  CBC: Recent Labs  Lab 12/25/19 1731 12/25/19 2315 12/26/19 0255 12/26/19 1233  WBC 10.0  --   --   --   HGB 10.2* 9.3* 8.7* 8.6*  HCT 33.3* 30.8*  --  28.0*  MCV 88.1  --   --   --   PLT 136*  --   --   --    Basic Metabolic Panel: Recent Labs  Lab 12/25/19 1731  NA 142  K 4.6  CL 107  CO2 26  GLUCOSE 142*  BUN 55*  CREATININE 2.33*  CALCIUM 9.2   GFR: Estimated Creatinine Clearance: 24.9 mL/min (A) (by C-G formula based on SCr of 2.33 mg/dL (H)). Liver Function Tests: Recent Labs  Lab 12/25/19 1731  AST 18  ALT 13  ALKPHOS 82  BILITOT 0.4  PROT 7.5  ALBUMIN 3.5   No results for input(s): LIPASE, AMYLASE in the last 168 hours. No results for input(s): AMMONIA in the last 168 hours. Coagulation Profile: Recent Labs  Lab 12/25/19 1731  INR 1.0   Cardiac Enzymes: No results for input(s): CKTOTAL, CKMB, CKMBINDEX, TROPONINI in the last 168 hours. BNP (last 3 results) No results for input(s): PROBNP in the last 8760 hours. HbA1C: Recent Labs    12/25/19 2315  HGBA1C 6.2*   CBG: Recent Labs  Lab 12/26/19 0042 12/26/19 0358 12/26/19 1228  GLUCAP 139* 119* 170*   Lipid Profile: No results for input(s): CHOL, HDL, LDLCALC, TRIG, CHOLHDL, LDLDIRECT in the last 72 hours. Thyroid Function Tests: No results for input(s): TSH, T4TOTAL, FREET4, T3FREE, THYROIDAB in the last 72 hours. Anemia Panel: No results for input(s): VITAMINB12, FOLATE, FERRITIN, TIBC, IRON, RETICCTPCT in the last 72 hours. Sepsis Labs: No results for input(s): PROCALCITON, LATICACIDVEN in the last  168 hours.  Recent Results (from the past 240 hour(s))  Resp Panel by RT-PCR (Flu A&B, Covid) Nasopharyngeal Swab     Status: None   Collection Time: 12/25/19 11:15 PM   Specimen: Nasopharyngeal Swab; Nasopharyngeal(NP) swabs in vial transport medium  Result Value Ref Range Status   SARS Coronavirus 2 by RT PCR NEGATIVE NEGATIVE Final    Comment: (NOTE) SARS-CoV-2 target nucleic acids are NOT DETECTED.  The SARS-CoV-2 RNA is generally detectable in upper respiratory specimens during the acute phase of infection. The lowest concentration of SARS-CoV-2 viral copies this assay can detect is 138 copies/mL. A negative result does not preclude SARS-Cov-2 infection and should not be used as the sole basis for treatment or other patient management decisions. A negative result may occur  with  improper specimen collection/handling, submission of specimen other than nasopharyngeal swab, presence of viral mutation(s) within the areas targeted by this assay, and inadequate number of viral copies(<138 copies/mL). A negative result must be combined with clinical observations, patient history, and epidemiological information. The expected result is Negative.  Fact Sheet for Patients:  EntrepreneurPulse.com.au  Fact Sheet for Healthcare Providers:  IncredibleEmployment.be  This test is no t yet approved or cleared by the [place] FDA and  has been authorized for detection and/or diagnosis of SARS-CoV-2 by FDA under an Emergency Use Authorization (EUA). This EUA will remain  in effect (meaning this test can be used) for the duration of the COVID-19 declaration under Section 564(b)(1) of the Act, 21 U.S.C.section 360bbb-3(b)(1), unless the authorization is terminated  or revoked sooner.       Influenza A by PCR NEGATIVE NEGATIVE Final   Influenza B by PCR NEGATIVE NEGATIVE Final    Comment: (NOTE) The Xpert Xpress SARS-CoV-2/FLU/RSV plus assay is  intended as an aid in the diagnosis of influenza from Nasopharyngeal swab specimens and should not be used as a sole basis for treatment. Nasal washings and aspirates are unacceptable for Xpert Xpress SARS-CoV-2/FLU/RSV testing.  Fact Sheet for Patients: EntrepreneurPulse.com.au  Fact Sheet for Healthcare Providers: IncredibleEmployment.be  This test is not yet approved or cleared by the [place] FDA and has been authorized for detection and/or diagnosis of SARS-CoV-2 by FDA under an Emergency Use Authorization (EUA). This EUA will remain in effect (meaning this test can be used) for the duration of the COVID-19 declaration under Section 564(b)(1) of the Act, 21 U.S.C. section 360bbb-3(b)(1), unless the authorization is terminated or revoked.  Performed at [facility], [address], [place], [place] [address]      Radiology Studies: No results found.  Scheduled Meds: . insulin aspart  0-15 Units Subcutaneous Q4H   Continuous Infusions:   LOS: 1 day   Marylu Lund, MD [facility] Pager On Amion  If 7PM-7AM, please contact night-coverage 12/26/2019, 2:49 PM

## 2019-12-26 NOTE — ED Notes (Signed)
Pt had another bloody BM. Damita Dunnings, MD notified via secure chat. VSS. Awaiting further orders. Will continue to monitor.

## 2019-12-26 NOTE — ED Notes (Signed)
Report to anna, rn

## 2019-12-26 NOTE — Consult Note (Signed)
Consultation  Referring Provider:     Dr. Damita Dunnings Admit date: 11/26 Consult date: 11/27        Reason for Consultation:   Hematochezia           HPI:   Ryan Mcclure is a 86 y.o. gentleman with history of CAD, peripheral vascular disease, stroke s/p endarterectomy, and diverticular bleed who presents for few day history of hematochezia. Patient takes eliquis and stays at a local nursing home where one of the CMA's noticed the bright red blood which he states progressed causing him to come for evaluation. He takes plavix but he does not know when the last dose was as they give him all his medications. He had a similar presentation in 2017 where he underwent colonoscopy with many diverticula but bleeding had stopped by then. He denies any other significant symptoms such as abdominal pain.    Past Medical History:  Diagnosis Date  . Chronic systolic CHF (congestive heart failure) ([place])   . CKD (chronic kidney disease), stage III ([place])   . Diabetes ([place])   . HBP (high blood pressure)   . Heart attack ([place])   . High cholesterol   . Stroke (cerebrum) ([place])   . Swelling     Past Surgical History:  Procedure Laterality Date  . COLONOSCOPY N/A 10/21/2015   Procedure: COLONOSCOPY;  Surgeon: Milus Banister, MD;  Location: [facility];  Service: Endoscopy;  Laterality: N/A;  . EXPLORATION POST OPERATIVE OPEN HEART    . EYE SURGERY Bilateral     Family History  Problem Relation Age of Onset  . Diabetes Mellitus I Mother   . Kidney failure Mother   . Alcoholism Father   . Heart attack Sister   . Prostate cancer Brother     Social History   Tobacco Use  . Smoking status: Former Smoker    Types: Cigarettes    Quit date: 01/30/1995    Years since quitting: 24.9  . Smokeless tobacco: Never Used  Vaping Use  . Vaping Use: Never used  Substance Use Topics  . Alcohol use: No  . Drug use: No    Prior to Admission medications   Medication Sig Start Date End Date Taking? Authorizing  Provider  amLODipine (NORVASC) 5 MG tablet Take 5 mg by mouth 2 (two) times daily. 12/17/19  Yes [provider]  atorvastatin (LIPITOR) 40 MG tablet Take 40 mg by mouth daily.   Yes [provider]  clopidogrel (PLAVIX) 75 MG tablet Take 75 mg by mouth at bedtime.    Yes [provider]  Cyanocobalamin (RA VITAMIN B-12 TR) 1000 MCG TBCR Take 1,000 mcg by mouth daily.    Yes [provider]  ferrous sulfate 325 (65 FE) MG tablet Take 325 mg by mouth 3 (three) times daily with meals.    Yes [provider]  fluticasone (FLONASE) 50 MCG/ACT nasal spray Place 1 spray into both nostrils daily as needed for allergies.  06/03/13  Yes [provider]  furosemide (LASIX) 20 MG tablet Take 40 mg by mouth 2 (two) times daily. 10/11/17  Yes [provider]  insulin aspart (NOVOLOG) 100 UNIT/ML FlexPen Inject 4-14 Units into the skin See admin instructions. Inject under the skin according to sliding scale: Breakfast (4u <100 or 8u >100) Lunch (6u <100 or 12u >100) Supper (7u <100 or 14u >100)   Yes [provider]  Insulin Glargine (BASAGLAR KWIKPEN) 100 UNIT/ML Inject 20 Units into the skin at  bedtime.  11/02/19  Yes [provider]  isosorbide mononitrate (IMDUR) 30 MG 24 hr tablet Take 30 mg by mouth daily. 12/10/19  Yes [provider]  metFORMIN (GLUCOPHAGE) 500 MG tablet Take 500 mg by mouth 2 (two) times daily. 07/23/17  Yes [provider]  metoprolol tartrate (LOPRESSOR) 50 MG tablet TAKE 1 TABLET BY MOUTH TWICE DAILY 11/18/17  Yes [provider]  nitroGLYCERIN (NITROSTAT) 0.4 MG SL tablet take 1 tablet under the tongue every 5 minutes if needed for chest pain 10/29/14  Yes [provider]  omeprazole (PRILOSEC) 40 MG capsule Take 40 mg by mouth 2 (two) times daily.    Yes [provider]  telmisartan (MICARDIS) 80 MG tablet Take 1 tablet by mouth daily. 12/01/19  Yes [provider]    Current Facility-Administered Medications  Medication Dose Route Frequency Provider Last Rate Last Admin  . acetaminophen (TYLENOL) tablet 650 mg  650 mg Oral Q6H PRN Athena Masse, MD       Or  . acetaminophen (TYLENOL) suppository 650 mg  650 mg Rectal Q6H PRN Athena Masse, MD      . insulin aspart (novoLOG) injection 0-15 Units  0-15 Units Subcutaneous Q4H Athena Masse, MD   2 Units at 12/26/19 0105  . morphine 2 MG/ML injection 2 mg  2 mg Intravenous Q2H PRN Athena Masse, MD      . ondansetron [facility]) tablet 4 mg  4 mg Oral Q6H PRN Athena Masse, MD       Or  . ondansetron [facility]) injection 4 mg  4 mg Intravenous Q6H PRN Athena Masse, MD       Current Outpatient Medications  Medication Sig Dispense Refill  . amLODipine (NORVASC) 5 MG tablet Take 5 mg by mouth 2 (two) times daily.    Marland Kitchen atorvastatin (LIPITOR) 40 MG tablet Take 40 mg by mouth daily.    . clopidogrel (PLAVIX) 75 MG tablet Take 75 mg by mouth at bedtime.     . Cyanocobalamin (RA VITAMIN B-12 TR) 1000 MCG TBCR Take 1,000 mcg by mouth daily.     . ferrous sulfate 325 (65 FE) MG tablet Take 325 mg by mouth 3 (three) times daily with meals.     . fluticasone (FLONASE) 50 MCG/ACT nasal spray Place 1 spray into both nostrils daily as needed for allergies.     . furosemide (LASIX) 20 MG tablet Take 40 mg by mouth 2 (two) times daily.  10  . insulin aspart (NOVOLOG) 100 UNIT/ML FlexPen Inject 4-14 Units into the skin See admin instructions. Inject under the skin according to sliding scale: Breakfast (4u <100 or 8u >100) Lunch (6u <100 or 12u >100) Supper (7u <100 or 14u >100)    . Insulin Glargine (BASAGLAR KWIKPEN) 100 UNIT/ML Inject 20 Units into the skin at bedtime.     . isosorbide mononitrate (IMDUR) 30 MG 24 hr tablet Take 30 mg by mouth daily.    . metFORMIN (GLUCOPHAGE) 500 MG tablet Take 500 mg by mouth 2 (two) times daily.  2  . metoprolol tartrate (LOPRESSOR) 50 MG tablet TAKE 1  TABLET BY MOUTH TWICE DAILY    . nitroGLYCERIN (NITROSTAT) 0.4 MG SL tablet take 1 tablet under the tongue every 5 minutes if needed for chest pain    . omeprazole (PRILOSEC) 40 MG capsule Take 40 mg by mouth 2 (two) times daily.     Marland Kitchen telmisartan (MICARDIS) 80 MG tablet  Take 1 tablet by mouth daily.      Allergies as of 12/25/2019 - Review Complete 12/25/2019  Allergen Reaction Noted  . Neuromuscular blocking agents Nausea And Vomiting and Other (See Comments) 08/11/2013  . Other Other (See Comments) 08/11/2013  . Shrimp [shellfish allergy]  11/07/2019  . Amlodipine Nausea And Vomiting 12/07/2014     Review of Systems:    All systems reviewed and negative except where noted in HPI.  Review of Systems  Constitutional: Negative for chills, fever and weight loss.  Respiratory: Negative for cough.   Cardiovascular: Negative for chest pain.  Gastrointestinal: Positive for blood in stool. Negative for abdominal pain, constipation, diarrhea, melena, nausea and vomiting.  Musculoskeletal: Positive for joint pain.  Neurological: Negative for focal weakness.  Psychiatric/Behavioral: Negative for substance abuse.  All other systems reviewed and are negative.     Physical Exam:  Vital signs in last 24 hours: Temp:  [97.5 F (36.4 C)-98.5 F (36.9 C)] 97.7 F (36.5 C) (11/27 0705) Pulse Rate:  [64-95] 95 (11/27 1100) Resp:  [15-20] 18 (11/27 1030) BP: (122-180)/(58-154) 170/77 (11/27 1100) SpO2:  [91 %-100 %] 96 % (11/27 1100) Weight:  [90.7 kg] 90.7 kg (11/26 1723)   General:   Pleasant in NAD Head:  Normocephalic and atraumatic. Eyes:   No icterus.   Conjunctiva pink. Mouth: Mucosa pink moist, no lesions. Neck:  Supple Lungs:  No respiratory distress Abdomen:   Flat, soft, nondistended, nontender. N Msk:  MAEW x4, No clubbing or cyanosis.  Neurologic:  No focal deficits Skin:  Warm, dry, pink without significant lesions or rashes. Psych:  Alert and cooperative. Normal  affect.  LAB RESULTS: Recent Labs    12/25/19 1731 12/25/19 2315 12/26/19 0255  WBC 10.0  --   --   HGB 10.2* 9.3* 8.7*  HCT 33.3* 30.8*  --   PLT 136*  --   --    BMET Recent Labs    12/25/19 1731  NA 142  K 4.6  CL 107  CO2 26  GLUCOSE 142*  BUN 55*  CREATININE 2.33*  CALCIUM 9.2   LFT Recent Labs    12/25/19 1731  PROT 7.5  ALBUMIN 3.5  AST 18  ALT 13  ALKPHOS 82  BILITOT 0.4   PT/INR Recent Labs    12/25/19 1731  LABPROT 13.2  INR 1.0    STUDIES: No results found.     Impression / Plan:   86 y/o gentleman with CAD, peripheral vascular disease, and history of diverticular bleeding who presents with several day history of hematochezia which is likely recurrent diverticular bleeding. Currently hemodynamically stable  - maintain active type and screen - maintain two large bore IV's - transfuse if hemoglobin < 7 (consider higher threshold of 8 or 9 given history of heart disease) - continue to hold plavix - clear liquid diet for now - will continue to monitor for now - if significant recurrent hematochezia would get tagged RBC scan  Please call with any questions or concerns.  Raylene Miyamoto MD, MPH [place]

## 2019-12-26 NOTE — ED Notes (Signed)
Report from [employer], rn.

## 2019-12-26 NOTE — ED Notes (Signed)
Sister given update via telephone, pt also given phone to speak to her.

## 2019-12-27 DIAGNOSIS — E1122 Type 2 diabetes mellitus with diabetic chronic kidney disease: Secondary | ICD-10-CM

## 2019-12-27 DIAGNOSIS — D62 Acute posthemorrhagic anemia: Secondary | ICD-10-CM

## 2019-12-27 DIAGNOSIS — N184 Chronic kidney disease, stage 4 (severe): Secondary | ICD-10-CM

## 2019-12-27 LAB — BPAM RBC
Blood Product Expiration Date: 202112252359
Blood Product Expiration Date: 202112252359
Blood Product Expiration Date: 202112282359
Blood Product Expiration Date: 202112282359
ISSUE DATE / TIME: 202111270328
ISSUE DATE / TIME: 202111270642
Unit Type and Rh: 1700
Unit Type and Rh: 1700
Unit Type and Rh: 7300
Unit Type and Rh: 7300

## 2019-12-27 LAB — TYPE AND SCREEN
ABO/RH(D): B POS
Antibody Screen: NEGATIVE
Unit division: 0
Unit division: 0
Unit division: 0
Unit division: 0

## 2019-12-27 LAB — CBC
HCT: 27.2 % — ABNORMAL LOW (ref 39.0–52.0)
Hemoglobin: 8.6 g/dL — ABNORMAL LOW (ref 13.0–17.0)
MCH: 26.2 pg (ref 26.0–34.0)
MCHC: 31.6 g/dL (ref 30.0–36.0)
MCV: 82.9 fL (ref 80.0–100.0)
Platelets: 93 10*3/uL — ABNORMAL LOW (ref 150–400)
RBC: 3.28 MIL/uL — ABNORMAL LOW (ref 4.22–5.81)
RDW: 16.2 % — ABNORMAL HIGH (ref 11.5–15.5)
WBC: 12.1 10*3/uL — ABNORMAL HIGH (ref 4.0–10.5)
nRBC: 0 % (ref 0.0–0.2)

## 2019-12-27 LAB — COMPREHENSIVE METABOLIC PANEL
ALT: 8 U/L (ref 0–44)
AST: 18 U/L (ref 15–41)
Albumin: 2.7 g/dL — ABNORMAL LOW (ref 3.5–5.0)
Alkaline Phosphatase: 56 U/L (ref 38–126)
Anion gap: 7 (ref 5–15)
BUN: 44 mg/dL — ABNORMAL HIGH (ref 8–23)
CO2: 22 mmol/L (ref 22–32)
Calcium: 8.6 mg/dL — ABNORMAL LOW (ref 8.9–10.3)
Chloride: 113 mmol/L — ABNORMAL HIGH (ref 98–111)
Creatinine, Ser: 1.89 mg/dL — ABNORMAL HIGH (ref 0.61–1.24)
GFR, Estimated: 34 mL/min — ABNORMAL LOW (ref >60)
Glucose, Bld: 104 mg/dL — ABNORMAL HIGH (ref 70–99)
Potassium: 4.6 mmol/L (ref 3.5–5.1)
Sodium: 142 mmol/L (ref 135–145)
Total Bilirubin: 1 mg/dL (ref 0.3–1.2)
Total Protein: 5.5 g/dL — ABNORMAL LOW (ref 6.5–8.1)

## 2019-12-27 LAB — GLUCOSE, CAPILLARY
Glucose-Capillary: 127 mg/dL — ABNORMAL HIGH (ref 70–99)
Glucose-Capillary: 131 mg/dL — ABNORMAL HIGH (ref 70–99)
Glucose-Capillary: 175 mg/dL — ABNORMAL HIGH (ref 70–99)
Glucose-Capillary: 178 mg/dL — ABNORMAL HIGH (ref 70–99)
Glucose-Capillary: 190 mg/dL — ABNORMAL HIGH (ref 70–99)
Glucose-Capillary: 93 mg/dL (ref 70–99)

## 2019-12-27 LAB — PREPARE RBC (CROSSMATCH)

## 2019-12-27 MED ORDER — PANTOPRAZOLE SODIUM 40 MG PO TBEC
40.0000 mg | DELAYED_RELEASE_TABLET | Freq: Every day | ORAL | Status: DC
  Administered 2019-12-27 – 2019-12-30 (×4): 40 mg via ORAL
  Filled 2019-12-27 (×4): qty 1

## 2019-12-27 MED ORDER — IRBESARTAN 150 MG PO TABS
75.0000 mg | ORAL_TABLET | Freq: Every day | ORAL | Status: DC
  Administered 2019-12-27 – 2019-12-30 (×4): 75 mg via ORAL
  Filled 2019-12-27 (×4): qty 1

## 2019-12-27 MED ORDER — ATORVASTATIN CALCIUM 20 MG PO TABS
40.0000 mg | ORAL_TABLET | Freq: Every day | ORAL | Status: DC
  Administered 2019-12-27 – 2019-12-30 (×4): 40 mg via ORAL
  Filled 2019-12-27 (×4): qty 2

## 2019-12-27 MED ORDER — METOPROLOL TARTRATE 50 MG PO TABS
50.0000 mg | ORAL_TABLET | Freq: Two times a day (BID) | ORAL | Status: DC
  Administered 2019-12-27 – 2019-12-30 (×6): 50 mg via ORAL
  Filled 2019-12-27 (×6): qty 1

## 2019-12-27 MED ORDER — ISOSORBIDE MONONITRATE ER 30 MG PO TB24
30.0000 mg | ORAL_TABLET | Freq: Every day | ORAL | Status: DC
  Administered 2019-12-27 – 2019-12-30 (×4): 30 mg via ORAL
  Filled 2019-12-27 (×4): qty 1

## 2019-12-27 MED ORDER — FUROSEMIDE 40 MG PO TABS
40.0000 mg | ORAL_TABLET | Freq: Two times a day (BID) | ORAL | Status: DC
  Administered 2019-12-27 – 2019-12-30 (×6): 40 mg via ORAL
  Filled 2019-12-27 (×6): qty 1

## 2019-12-27 MED ORDER — AMLODIPINE BESYLATE 5 MG PO TABS
5.0000 mg | ORAL_TABLET | Freq: Two times a day (BID) | ORAL | Status: DC
  Administered 2019-12-27 – 2019-12-30 (×6): 5 mg via ORAL
  Filled 2019-12-27 (×5): qty 1

## 2019-12-27 MED ORDER — PEG 3350-KCL-NA BICARB-NACL 420 G PO SOLR
4000.0000 mL | Freq: Once | ORAL | Status: AC
  Administered 2019-12-27: 4000 mL via ORAL
  Filled 2019-12-27: qty 4000

## 2019-12-27 NOTE — Progress Notes (Signed)
PROGRESS NOTE    Ryan Mcclure  ZOX:[id number] DOB: August 31, 1931 DOA: 12/25/2019 PCP: [place] Pink, MD    Brief Narrative:  [phone] with hx of CAD s/p CABG, ischemic cardiomyopathy, DM, CKD4, and prior hx of diverticular bleed who presents to the ED with one day hx of BRBPR. Hospitalist consulted for consideration for admission  Assessment & Plan:   Principal Problem:   Hematochezia Active Problems:   Diabetes mellitus ([place])   GIB (gastrointestinal bleeding)   Chronic systolic CHF (congestive heart failure) (HCC)   Acute blood loss anemia   Hypertension associated with diabetes ([place])   CKD stage 4 due to type 2 diabetes mellitus ([place])   History of GI diverticular bleed   CAD (coronary artery disease)   History of CVA (cerebrovascular accident)   History of aortic valve replacement with bioprosthetic valve     Hematochezia/lower GI bleed   History of recurrent GI bleed    Acute blood loss anemia -Patient with history of diverticular bleed presenting with history of bright red blood per rectum and downward trending hemoglobin -Patient hemodynamically stable -Hemoglobin remains around 8.6 despite receiving 2 units of PRBC's on 11/27 -GI following. 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 Will resume -Follows with Dr. Nehemiah Massed. -Last echo on chart review 2019 with EF 60 to 65%    Hypertension associated with diabetes ([place]) -antihypertensives were held given concerns of acute blood loss -BP is trending up. Will resume home meds     CKD stage 4 due to type 2 diabetes mellitus ([place]) -Current Creatinine 2.33. Was 2.21 a month prior -Cr is 1.89 today,  remains near baseline. Resume home meds per above    CAD with history of CABG (coronary artery disease) -No complaints of chest pain  -Nitroglycerin sublingual as needed chest pain -Hold Plavix for now secondary to blood loss anemia.  -Cont metoprolol, imdur, ARB per home regimen    History of CVA without residual deficit (cerebrovascular accident) History of carotid endarterectomy -Holding Plavix. Will continue statin per home regimen    History of aortic valve replacement with bioprosthetic valve -No acute disease, seems stable at this time   DVT prophylaxis: SCD's Code Status: Full Family Communication: Pt in room, family not at bedside  Status is: Inpatient  Remains inpatient appropriate because:Ongoing diagnostic testing needed not appropriate for outpatient work up, Unsafe d/c plan and Inpatient level of care appropriate due to severity of illness   Dispo: The patient is from: Home              Anticipated d/c is to: Home              Anticipated d/c date is: 2 days              Patient currently is not medically stable to d/c.       Consultants:   GI  Procedures:     Antimicrobials: Anti-infectives (From admission, onward)   None      Subjective: Without complaints this AM. Denies abd pain or nausea  Objective: Vitals:   12/26/19 1956 12/27/19 0411 12/27/19 0822 12/27/19 1206  BP: 123/69 (!) 146/89 128/70 (!) 162/63  Pulse: 82 83 91 86  Resp:   17 18  Temp: 98.5 F (36.9 C) 98.1 F (36.7 C) 98 F (36.7 C) 98.5 F (36.9 C)  TempSrc: Oral Oral Oral Oral  SpO2: 100% 98% 98% 100%  Weight:  95.8 kg    Height:        Intake/Output Summary (Last 24 hours) at 12/27/2019 1441 Last data filed at 12/27/2019 1423 Gross per 24 hour  Intake --  Output 0 ml  Net 0 ml   Filed Weights   12/25/19 1723 12/27/19 0411  Weight: 90.7 kg 95.8 kg    Examination: General exam: Awake, laying in bed, in nad Respiratory system: Normal respiratory effort,  no wheezing Cardiovascular system: regular rate, s1, s2 Gastrointestinal system: Soft, nondistended, positive BS Central nervous system: CN2-12 grossly intact, strength intact Extremities: Perfused, no clubbing Skin: Normal skin turgor, no notable skin lesions seen Psychiatry: Mood normal // no visual hallucinations   Data Reviewed: I have personally reviewed following labs and imaging studies  CBC: Recent Labs  Lab 12/25/19 1731 12/25/19 1731 12/25/19 2315 12/26/19 0255 12/26/19 1233 12/26/19 1731 12/27/19 0708  WBC 10.0  --   --   --   --   --  12.1*  HGB 10.2*   < > 9.3* 8.7* 8.6* 10.2* 8.6*  HCT 33.3*  --  30.8*  --  28.0* 31.9* 27.2*  MCV 88.1  --   --   --   --   --  82.9  PLT 136*  --   --   --   --   --  93*   < > = values in this interval not displayed.   Basic Metabolic Panel: Recent Labs  Lab 12/25/19 1731 12/27/19 0602  NA 142 142  K 4.6 4.6  CL 107 113*  CO2 26 22  GLUCOSE 142* 104*  BUN 55* 44*  CREATININE 2.33* 1.89*  CALCIUM 9.2 8.6*   GFR: Estimated Creatinine Clearance: 31.5 mL/min (A) (by C-G formula based on SCr of 1.89 mg/dL (H)). Liver Function Tests: Recent Labs  Lab 12/25/19 1731 12/27/19 0602  AST 18 18  ALT 13 8  ALKPHOS 82 56  BILITOT 0.4 1.0  PROT 7.5 5.5*  ALBUMIN 3.5 2.7*   No results for input(s): LIPASE, AMYLASE in the last 168 hours. No results for input(s): AMMONIA in the last 168 hours. Coagulation Profile: Recent Labs  Lab 12/25/19 1731  INR 1.0   Cardiac Enzymes: No results for input(s): CKTOTAL, CKMB, CKMBINDEX, TROPONINI in the last 168 hours. BNP (last 3 results) No results for input(s): PROBNP in the last 8760 hours. HbA1C: Recent Labs    12/25/19 2315  HGBA1C 6.2*   CBG: Recent Labs  Lab 12/26/19 2000 12/26/19 2341 12/27/19 0411 12/27/19 0819 12/27/19 1207  GLUCAP 255* 109* 93 131* 175*   Lipid Profile: No results for input(s): CHOL, HDL, LDLCALC, TRIG, CHOLHDL, LDLDIRECT in the last 72  hours. Thyroid Function Tests: No results for input(s): TSH, T4TOTAL, FREET4, T3FREE, THYROIDAB in the last 72 hours. Anemia Panel: No results for input(s): VITAMINB12, FOLATE, FERRITIN, TIBC, IRON, RETICCTPCT in the last 72 hours. Sepsis Labs: No results for input(s): PROCALCITON, LATICACIDVEN in the last 168 hours.  Recent Results (from the past 240 hour(s))  Resp Panel by RT-PCR (Flu A&B, Covid) Nasopharyngeal Swab     Status: None   Collection Time: 12/25/19 11:15 PM   Specimen: Nasopharyngeal Swab; Nasopharyngeal(NP) swabs in vial transport medium  Result Value Ref Range Status   SARS Coronavirus  2 by RT PCR NEGATIVE NEGATIVE Final    Comment: (NOTE) SARS-CoV-2 target nucleic acids are NOT DETECTED.  The SARS-CoV-2 RNA is generally detectable in upper respiratory specimens during the acute phase of infection. The lowest concentration of SARS-CoV-2 viral copies this assay can detect is 138 copies/mL. A negative result does not preclude SARS-Cov-2 infection and should not be used as the sole basis for treatment or other patient management decisions. A negative result may occur with  improper specimen collection/handling, submission of specimen other than nasopharyngeal swab, presence of viral mutation(s) within the areas targeted by this assay, and inadequate number of viral copies(<138 copies/mL). A negative result must be combined with clinical observations, patient history, and epidemiological information. The expected result is Negative.  Fact Sheet for Patients:  EntrepreneurPulse.com.au  Fact Sheet for Healthcare Providers:  IncredibleEmployment.be  This test is no t yet approved or cleared by the [place] FDA and  has been authorized for detection and/or diagnosis of SARS-CoV-2 by FDA under an Emergency Use Authorization (EUA). This EUA will remain  in effect (meaning this test can be used) for the duration of the COVID-19  declaration under Section 564(b)(1) of the Act, 21 U.S.C.section 360bbb-3(b)(1), unless the authorization is terminated  or revoked sooner.       Influenza A by PCR NEGATIVE NEGATIVE Final   Influenza B by PCR NEGATIVE NEGATIVE Final    Comment: (NOTE) The Xpert Xpress SARS-CoV-2/FLU/RSV plus assay is intended as an aid in the diagnosis of influenza from Nasopharyngeal swab specimens and should not be used as a sole basis for treatment. Nasal washings and aspirates are unacceptable for Xpert Xpress SARS-CoV-2/FLU/RSV testing.  Fact Sheet for Patients: EntrepreneurPulse.com.au  Fact Sheet for Healthcare Providers: IncredibleEmployment.be  This test is not yet approved or cleared by the [place] FDA and has been authorized for detection and/or diagnosis of SARS-CoV-2 by FDA under an Emergency Use Authorization (EUA). This EUA will remain in effect (meaning this test can be used) for the duration of the COVID-19 declaration under Section 564(b)(1) of the Act, 21 U.S.C. section 360bbb-3(b)(1), unless the authorization is terminated or revoked.  Performed at [facility], [address], [place], [place] [address]      Radiology Studies: No results found.  Scheduled Meds: . insulin aspart  0-15 Units Subcutaneous Q4H  . polyethylene glycol-electrolytes  4,000 mL Oral Once   Continuous Infusions:   LOS: 2 days   Marylu Lund, MD [facility] Pager On Amion  If 7PM-7AM, please contact night-coverage 12/27/2019, 2:41 PM

## 2019-12-27 NOTE — Progress Notes (Signed)
GI Inpatient Follow-up Note  Subjective:  Patient seen and states he had some more bloody bowel movements. Hemoglobin went from 8.6 to 10 to 8.6 so the 10 was likely lab error. No abdominal pain or significant other symptoms.  Scheduled Inpatient Medications:  . insulin aspart  0-15 Units Subcutaneous Q4H    Continuous Inpatient Infusions:    PRN Inpatient Medications:  acetaminophen **OR** acetaminophen, morphine injection, ondansetron **OR** ondansetron (ZOFRAN) IV  Review of Systems:  Review of Systems  Constitutional: Positive for weight loss. Negative for chills and fever.  Respiratory: Negative for cough.   Cardiovascular: Negative for chest pain.  Gastrointestinal: Positive for blood in stool. Negative for abdominal pain, constipation, diarrhea, melena, nausea and vomiting.  Musculoskeletal: Positive for joint pain.  Skin: Negative for rash.  Neurological: Negative for focal weakness.  Psychiatric/Behavioral: Negative for substance abuse.  All other systems reviewed and are negative.    Physical Examination: BP (!) 162/63 (BP Location: Right Arm)   Pulse 86   Temp 98.5 F (36.9 C) (Oral)   Resp 18   Ht 5\' 7"  (1.702 m)   Wt 95.8 kg   SpO2 100%   BMI 33.09 kg/m  Gen: NAD, alert and oriented x 4 HEENT: PEERLA, EOMI, Neck: supple, no JVD or thyromegaly Chest: No respiratory distress Abd: soft, NT, ND, Ext: no edema, well perfused with 2+ pulses, Skin: no rash or lesions noted Lymph: no LAD  Data: Lab Results  Component Value Date   WBC 12.1 (H) 12/27/2019   HGB 8.6 (L) 12/27/2019   HCT 27.2 (L) 12/27/2019   MCV 82.9 12/27/2019   PLT 93 (L) 12/27/2019   Recent Labs  Lab 12/26/19 1233 12/26/19 1731 12/27/19 0708  HGB 8.6* 10.2* 8.6*   Lab Results  Component Value Date   NA 142 12/27/2019   K 4.6 12/27/2019   CL 113 (H) 12/27/2019   CO2 22 12/27/2019   BUN 44 (H) 12/27/2019   CREATININE 1.89 (H) 12/27/2019   Lab Results  Component Value Date    ALT 8 12/27/2019   AST 18 12/27/2019   ALKPHOS 56 12/27/2019   BILITOT 1.0 12/27/2019   Recent Labs  Lab 12/25/19 1731  INR 1.0   Assessment/Plan: Mr. Mott is a 86 y.o. with CAD, peripheral vascular disease, and history of diverticular bleeding who presents with several day history of hematochezia which is likely recurrent diverticular bleeding. Currently hemodynamically stable  Recommendations:  - maintain active type and screen - maintain two large bore IV's - transfuse if hemoglobin < 7 (consider higher threshold of 8 or 9 given history of heart disease) - continue to hold plavix - clear liquid diet for now, NPO at midnight - will tentatively plan for colonoscopy tomorrow - will order golytely. Needs to drink 1/2 tonight and finish the other half tomorrow morning starting at 4-5 and finish by 8 am - if significant recurrent hematochezia would get tagged RBC scan  Please call with any questions or concerns.  Raylene Miyamoto MD, MPH [place]

## 2019-12-28 ENCOUNTER — Encounter: Payer: Self-pay | Admitting: Internal Medicine

## 2019-12-28 ENCOUNTER — Encounter
Admission: EM | Disposition: A | Discharge status: Home Health | Payer: Self-pay | Source: Home / Self Care | Attending: Internal Medicine

## 2019-12-28 ENCOUNTER — Inpatient Hospital Stay: Payer: Medicare Other | Admitting: Certified Registered"

## 2019-12-28 HISTORY — PX: COLONOSCOPY WITH PROPOFOL: SHX5780

## 2019-12-28 LAB — GLUCOSE, CAPILLARY
Glucose-Capillary: 123 mg/dL — ABNORMAL HIGH (ref 70–99)
Glucose-Capillary: 126 mg/dL — ABNORMAL HIGH (ref 70–99)
Glucose-Capillary: 116 mg/dL — ABNORMAL HIGH (ref 70–99)
Glucose-Capillary: 182 mg/dL — ABNORMAL HIGH (ref 70–99)
Glucose-Capillary: 283 mg/dL — ABNORMAL HIGH (ref 70–99)
Glucose-Capillary: 267 mg/dL — ABNORMAL HIGH (ref 70–99)

## 2019-12-28 LAB — COMPREHENSIVE METABOLIC PANEL
ALT: 10 U/L (ref 0–44)
AST: 15 U/L (ref 15–41)
Albumin: 2.9 g/dL — ABNORMAL LOW (ref 3.5–5.0)
Alkaline Phosphatase: 59 U/L (ref 38–126)
Anion gap: 8 (ref 5–15)
BUN: 42 mg/dL — ABNORMAL HIGH (ref 8–23)
CO2: 24 mmol/L (ref 22–32)
Calcium: 8.8 mg/dL — ABNORMAL LOW (ref 8.9–10.3)
Chloride: 114 mmol/L — ABNORMAL HIGH (ref 98–111)
Creatinine, Ser: 2.33 mg/dL — ABNORMAL HIGH (ref 0.61–1.24)
GFR, Estimated: 27 mL/min — ABNORMAL LOW (ref >60)
Glucose, Bld: 130 mg/dL — ABNORMAL HIGH (ref 70–99)
Potassium: 4.5 mmol/L (ref 3.5–5.1)
Sodium: 146 mmol/L — ABNORMAL HIGH (ref 135–145)
Total Bilirubin: 0.8 mg/dL (ref 0.3–1.2)
Total Protein: 5.7 g/dL — ABNORMAL LOW (ref 6.5–8.1)

## 2019-12-28 LAB — CBC
HCT: 24.3 % — ABNORMAL LOW (ref 39.0–52.0)
Hemoglobin: 7.6 g/dL — ABNORMAL LOW (ref 13.0–17.0)
MCH: 26.4 pg (ref 26.0–34.0)
MCHC: 31.3 g/dL (ref 30.0–36.0)
MCV: 84.4 fL (ref 80.0–100.0)
Platelets: 87 10*3/uL — ABNORMAL LOW (ref 150–400)
RBC: 2.88 MIL/uL — ABNORMAL LOW (ref 4.22–5.81)
RDW: 16.1 % — ABNORMAL HIGH (ref 11.5–15.5)
WBC: 11.3 10*3/uL — ABNORMAL HIGH (ref 4.0–10.5)
nRBC: 0 % (ref 0.0–0.2)

## 2019-12-28 SURGERY — COLONOSCOPY WITH PROPOFOL
Anesthesia: General

## 2019-12-28 MED ORDER — PROPOFOL 500 MG/50ML IV EMUL
INTRAVENOUS | Status: DC | PRN
  Administered 2019-12-28: 120 ug/kg/min via INTRAVENOUS

## 2019-12-28 MED ORDER — PHENYLEPHRINE HCL (PRESSORS) 10 MG/ML IV SOLN
INTRAVENOUS | Status: DC | PRN
  Administered 2019-12-28: 100 ug via INTRAVENOUS

## 2019-12-28 MED ORDER — SODIUM CHLORIDE 0.9 % IV SOLN: INTRAVENOUS | Status: DC | PRN

## 2019-12-28 NOTE — Progress Notes (Signed)
PROGRESS NOTE    Ryan Mcclure  ZOX:[id number] DOB: [birth year] DOA: 12/25/2019 PCP: [place] Pink, MD    Brief Narrative:  [phone] with hx of CAD s/p CABG, ischemic cardiomyopathy, DM, CKD4, and prior hx of diverticular bleed who presents to the ED with one day hx of BRBPR. Hospitalist consulted for consideration for admission  Assessment & Plan:   Principal Problem:   Hematochezia Active Problems:   Diabetes mellitus ([place])   GIB (gastrointestinal bleeding)   Chronic systolic CHF (congestive heart failure) (HCC)   Acute blood loss anemia   Hypertension associated with diabetes ([place])   CKD stage 4 due to type 2 diabetes mellitus ([place])   History of GI diverticular bleed   CAD (coronary artery disease)   History of CVA (cerebrovascular accident)   History of aortic valve replacement with bioprosthetic valve     Hematochezia/lower GI bleed   History of recurrent GI bleed    Acute blood loss anemia -Patient with history of diverticular bleed presenting with history of bright red blood per rectum and downward trending hemoglobin -Patient hemodynamically stable -Hemoglobin remains around 8.6 despite receiving 2 units of PRBC's on 11/27 -GI following. Rec to cont to hold plavix -Pt underwent colonoscopy today, reviewed. 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 Have resumed -Follows with Dr. Nehemiah Massed. -Last echo on chart review 2019 with EF 60 to 65% -Seems euvolemic this AM    Hypertension associated with diabetes ([place]) -antihypertensives were held given concerns of acute blood loss -BP is trending  up. Continue home meds     CKD stage 4 due to type 2 diabetes mellitus ([place]) -Current Creatinine 2.33. Was 2.21 a month prior -Cont home meds as tolerated    CAD with history of CABG (coronary artery disease) -No complaints of chest pain  -Nitroglycerin sublingual as needed chest pain -Holding Plavix for now secondary to blood loss anemia.  -Cont metoprolol, imdur, ARB per home regimen per above    History of CVA without residual deficit (cerebrovascular accident) History of carotid endarterectomy -Holding Plavix per above. Will continue statin per home regimen    History of aortic valve replacement with bioprosthetic valve -No acute disease, seems stable at this time   DVT prophylaxis: SCD's Code Status: Full Family Communication: Pt in room, family not at bedside  Status is: Inpatient  Remains inpatient appropriate because:Ongoing diagnostic testing needed not appropriate for outpatient work up, Unsafe d/c plan and Inpatient level of care appropriate due to severity of illness  Dispo: The patient is from: Home              Anticipated d/c is to: Home              Anticipated d/c date is: 2 days              Patient currently is not medically stable to d/c.   Consultants:   GI  Procedures:     Antimicrobials: Anti-infectives (From admission, onward)   None      Subjective: Denies abd pain or sob  Objective: Vitals:   12/28/19 0738 12/28/19 1157 12/28/19 1353 12/28/19 1434  BP: (!) 152/58 Marland Kitchen)  121/58 (!) 137/58   Pulse: 83 75 65   Resp: 19 18 16    Temp: 98.2 F (36.8 C) 98 F (36.7 C) (!) 96.9 F (36.1 C) 98.3 F (36.8 C)  TempSrc: Oral Oral Temporal Temporal  SpO2: 100% 98% 98%   Weight:   95.3 kg   Height:   5\' 7"  (1.702 m)     Intake/Output Summary (Last 24 hours) at 12/28/2019 1509 Last data filed at 12/28/2019 1423 Gross per 24 hour  Intake 100 ml  Output 325 ml  Net -225 ml   Filed Weights   12/27/19 0411 12/28/19 0423 12/28/19 1353   Weight: 95.8 kg 95.3 kg 95.3 kg    Examination: General exam: Awake, laying in bed, in nad Respiratory system: Normal respiratory effort, no wheezing Cardiovascular system: regular rate, s1, s2 Gastrointestinal system: Soft, nondistended, positive BS Central nervous system: CN2-12 grossly intact, strength intact Extremities: Perfused, no clubbing Skin: Normal skin turgor, no notable skin lesions seen Psychiatry: Mood normal // no visual hallucinations   Data Reviewed: I have personally reviewed following labs and imaging studies  CBC: Recent Labs  Lab 12/25/19 1731 12/25/19 1731 12/25/19 2315 12/25/19 2315 12/26/19 0255 12/26/19 1233 12/26/19 1731 12/27/19 0708 12/28/19 0542  WBC 10.0  --   --   --   --   --   --  12.1* 11.3*  HGB 10.2*   < > 9.3*   < > 8.7* 8.6* 10.2* 8.6* 7.6*  HCT 33.3*   < > 30.8*  --   --  28.0* 31.9* 27.2* 24.3*  MCV 88.1  --   --   --   --   --   --  82.9 84.4  PLT 136*  --   --   --   --   --   --  93* 87*   < > = values in this interval not displayed.   Basic Metabolic Panel: Recent Labs  Lab 12/25/19 1731 12/27/19 0602 12/28/19 0542  NA 142 142 146*  K 4.6 4.6 4.5  CL 107 113* 114*  CO2 26 22 24   GLUCOSE 142* 104* 130*  BUN 55* 44* 42*  CREATININE 2.33* 1.89* 2.33*  CALCIUM 9.2 8.6* 8.8*   GFR: Estimated Creatinine Clearance: 25.5 mL/min (A) (by C-G formula based on SCr of 2.33 mg/dL (H)). Liver Function Tests: Recent Labs  Lab 12/25/19 1731 12/27/19 0602 12/28/19 0542  AST 18 18 15   ALT 13 8 10   ALKPHOS 82 56 59  BILITOT 0.4 1.0 0.8  PROT 7.5 5.5* 5.7*  ALBUMIN 3.5 2.7* 2.9*   No results for input(s): LIPASE, AMYLASE in the last 168 hours. No results for input(s): AMMONIA in the last 168 hours. Coagulation Profile: Recent Labs  Lab 12/25/19 1731  INR 1.0   Cardiac Enzymes: No results for input(s): CKTOTAL, CKMB, CKMBINDEX, TROPONINI in the last 168 hours. BNP (last 3 results) No results for input(s): PROBNP in  the last 8760 hours. HbA1C: Recent Labs    12/25/19 2315  HGBA1C 6.2*   CBG: Recent Labs  Lab 12/27/19 2104 12/27/19 2347 12/28/19 0424 12/28/19 0740 12/28/19 1158  GLUCAP 178* 127* 123* 126* 116*   Lipid Profile: No results for input(s): CHOL, HDL, LDLCALC, TRIG, CHOLHDL, LDLDIRECT in the last 72 hours. Thyroid Function Tests: No results for input(s): TSH, T4TOTAL, FREET4, T3FREE, THYROIDAB in the last 72 hours. Anemia Panel: No results for input(s): VITAMINB12, FOLATE, FERRITIN, TIBC, IRON, RETICCTPCT in the last 72 hours. Sepsis  Labs: No results for input(s): PROCALCITON, LATICACIDVEN in the last 168 hours.  Recent Results (from the past 240 hour(s))  Resp Panel by RT-PCR (Flu A&B, Covid) Nasopharyngeal Swab     Status: None   Collection Time: 12/25/19 11:15 PM   Specimen: Nasopharyngeal Swab; Nasopharyngeal(NP) swabs in vial transport medium  Result Value Ref Range Status   SARS Coronavirus 2 by RT PCR NEGATIVE NEGATIVE Final    Comment: (NOTE) SARS-CoV-2 target nucleic acids are NOT DETECTED.  The SARS-CoV-2 RNA is generally detectable in upper respiratory specimens during the acute phase of infection. The lowest concentration of SARS-CoV-2 viral copies this assay can detect is 138 copies/mL. A negative result does not preclude SARS-Cov-2 infection and should not be used as the sole basis for treatment or other patient management decisions. A negative result may occur with  improper specimen collection/handling, submission of specimen other than nasopharyngeal swab, presence of viral mutation(s) within the areas targeted by this assay, and inadequate number of viral copies(<138 copies/mL). A negative result must be combined with clinical observations, patient history, and epidemiological information. The expected result is Negative.  Fact Sheet for Patients:  EntrepreneurPulse.com.au  Fact Sheet for Healthcare Providers:   IncredibleEmployment.be  This test is no t yet approved or cleared by the [place] FDA and  has been authorized for detection and/or diagnosis of SARS-CoV-2 by FDA under an Emergency Use Authorization (EUA). This EUA will remain  in effect (meaning this test can be used) for the duration of the COVID-19 declaration under Section 564(b)(1) of the Act, 21 U.S.C.section 360bbb-3(b)(1), unless the authorization is terminated  or revoked sooner.       Influenza A by PCR NEGATIVE NEGATIVE Final   Influenza B by PCR NEGATIVE NEGATIVE Final    Comment: (NOTE) The Xpert Xpress SARS-CoV-2/FLU/RSV plus assay is intended as an aid in the diagnosis of influenza from Nasopharyngeal swab specimens and should not be used as a sole basis for treatment. Nasal washings and aspirates are unacceptable for Xpert Xpress SARS-CoV-2/FLU/RSV testing.  Fact Sheet for Patients: EntrepreneurPulse.com.au  Fact Sheet for Healthcare Providers: IncredibleEmployment.be  This test is not yet approved or cleared by the [place] FDA and has been authorized for detection and/or diagnosis of SARS-CoV-2 by FDA under an Emergency Use Authorization (EUA). This EUA will remain in effect (meaning this test can be used) for the duration of the COVID-19 declaration under Section 564(b)(1) of the Act, 21 U.S.C. section 360bbb-3(b)(1), unless the authorization is terminated or revoked.  Performed at [facility], [address], [place], [place] [address]      Radiology Studies: No results found.  Scheduled Meds: . [MAR Hold] amLODipine  5 mg Oral BID  . [MAR Hold] atorvastatin  40 mg Oral Daily  . [MAR Hold] furosemide  40 mg Oral BID  . [MAR Hold] insulin aspart  0-15 Units Subcutaneous Q4H  . [MAR Hold] irbesartan  75 mg Oral Daily  . [MAR Hold] isosorbide mononitrate  30 mg Oral Daily  . [MAR Hold] metoprolol tartrate  50 mg Oral BID   . [MAR Hold] pantoprazole  40 mg Oral Daily   Continuous Infusions:   LOS: 3 days   Marylu Lund, MD [facility] Pager On Amion  If 7PM-7AM, please contact night-coverage 12/28/2019, 3:09 PM

## 2019-12-28 NOTE — Care Plan (Signed)
Colonoscopy performed. No bleeding noted. Likely resolved diverticular bleeding. Can advance diet. Monitor daily hemoglobins. In terms of resuming anticoagulation will need to weigh risks vs benefits given recurrent diverticular bleeding. If plan on restarting plavix would weight at least one week before restarting.  Please call with any questions or concerns.  Raylene Miyamoto MD, MPH [place]

## 2019-12-28 NOTE — Progress Notes (Signed)
Mobility Specialist - Progress Note   12/28/19 1500  Mobility  Activity Off unit  Mobility performed by Mobility specialist    Per chart review, pt currently off unit to undergo colonoscopy procedure. Will attempt session at another date/time as pt becomes available.    Kathee Delton Mobility Specialist 12/28/19, 3:03 PM

## 2019-12-28 NOTE — Op Note (Signed)
[facility] Gastroenterology Patient Name: Ryan Mcclure Procedure Date: 12/28/2019 1:57 PM MRN: [id number] Account #: [id number] Date of Birth: [birth year] Admit Type: Outpatient Age: 86 Room: [facility] ENDO ROOM 3 Gender: Male Note Status: Finalized Procedure:             Colonoscopy Indications:           Hematochezia Providers:             Andrey Farmer MD, MD Medicines:             Monitored Anesthesia Care Complications:         No immediate complications. Procedure:             Pre-Anesthesia Assessment:                        - Prior to the procedure, a History and Physical was                         performed, and patient medications and allergies were                         reviewed. The patient is competent. The risks and                         benefits of the procedure and the sedation options and                         risks were discussed with the patient. All questions                         were answered and informed consent was obtained.                         Patient identification and proposed procedure were                         verified by the physician, the nurse, the anesthetist                         and the technician in the endoscopy suite. Mental                         Status Examination: alert and oriented. Airway                         Examination: normal oropharyngeal airway and neck                         mobility. Respiratory Examination: clear to                         auscultation. CV Examination: normal. Prophylactic                         Antibiotics: The patient does not require prophylactic                         antibiotics. Prior Anticoagulants: The patient has  taken Plavix (clopidogrel), last dose was 3 days prior                         to procedure. ASA Grade Assessment: III - A patient                         with severe systemic disease. After reviewing the                          risks and benefits, the patient was deemed in                         satisfactory condition to undergo the procedure. The                         anesthesia plan was to use monitored anesthesia care                         (MAC). Immediately prior to administration of                         medications, the patient was re-assessed for adequacy                         to receive sedatives. The heart rate, respiratory                         rate, oxygen saturations, blood pressure, adequacy of                         pulmonary ventilation, and response to care were                         monitored throughout the procedure. The physical                         status of the patient was re-assessed after the                         procedure.                        After obtaining informed consent, the colonoscope was                         passed under direct vision. Throughout the procedure,                         the patient's blood pressure, pulse, and oxygen                         saturations were monitored continuously. The                         Colonoscope was introduced through the anus and                         advanced to the the cecum, identified by appendiceal  orifice and ileocecal valve. The colonoscopy was                         performed without difficulty. The patient tolerated                         the procedure well. The quality of the bowel                         preparation was fair. Findings:      The perianal and digital rectal examinations were normal.      Many small and large-mouthed diverticula were found in the sigmoid       colon, descending colon, transverse colon and ascending colon.      There is no endoscopic evidence of bleeding in the entire colon.      A 5 mm polyp was found in the ascending colon. The polyp was sessile.       Polypectomy was not attempted due to the patient taking anticoagulation       medication.       The exam was otherwise without abnormality on direct and retroflexion       views. Impression:            - Preparation of the colon was fair.                        - Diverticulosis in the sigmoid colon, in the                         descending colon, in the transverse colon and in the                         ascending colon.                        - One 5 mm polyp in the ascending colon. Resection not                         attempted due to plavix use and indication for                         colonoscopy was hematochezia.                        - The examination was otherwise normal on direct and                         retroflexion views.                        - No specimens collected. Recommendation:        - Return patient to hospital ward for ongoing care.                        - Advance diet as tolerated.                        - Continue present medications.                        -  continue to monitor for any recurrent bleeding and                         monitor hemoglobins                        - Repeat colonoscopy can be considered as an                         outpatient to attempt polypectomy of found ascending                         colon polyp. Risks vs benefits can discussed given age                         and small size of polyp. Procedure Code(s):     --- Professional ---                        [phone], Colonoscopy, flexible; diagnostic, including                         collection of specimen(s) by brushing or washing, when                         performed (separate procedure) Diagnosis Code(s):     --- Professional ---                        K63.5, Polyp of colon                        K92.1, Melena (includes Hematochezia)                        K57.30, Diverticulosis of large intestine without                         perforation or abscess without bleeding CPT copyright 2019 American Medical Association. All rights reserved. The codes documented in this  report are preliminary and upon coder review may  be revised to meet current compliance requirements. Andrey Farmer, MD Andrey Farmer MD, MD 12/28/2019 2:43:47 PM Number of Addenda: 0 Note Initiated On: 12/28/2019 1:57 PM Scope Withdrawal Time: 0 hours 7 minutes 11 seconds  Total Procedure Duration: 0 hours 10 minutes 5 seconds  Estimated Blood Loss:  Estimated blood loss: none.      [facility]

## 2019-12-28 NOTE — Progress Notes (Addendum)
Pt alert and oriented x 4. Pt states that he is not in pain. Pt has had several bowel movements due to go lightly. Pt went NPO at midnight for colonoscopy procedure scheduled for 11/29. Will continue pt care.

## 2019-12-28 NOTE — Anesthesia Preprocedure Evaluation (Addendum)
Anesthesia Evaluation  Patient identified by MRN, date of birth, ID band Patient awake    Reviewed: Allergy & Precautions, NPO status , Patient's Chart, lab work & pertinent test results  Airway Mallampati: III       Dental no notable dental hx.    Pulmonary shortness of breath, former smoker,    Pulmonary exam normal        Cardiovascular hypertension, + CAD, + Past MI and +CHF  Normal cardiovascular exam Rhythm:Regular Rate:Normal     Neuro/Psych negative neurological ROS  negative psych ROS   GI/Hepatic Neg liver ROS, GERD  ,  Endo/Other  negative endocrine ROSdiabetes  Renal/GU Renal disease  negative genitourinary   Musculoskeletal negative musculoskeletal ROS (+)   Abdominal   Peds negative pediatric ROS (+)  Hematology negative hematology ROS (+) anemia ,   Anesthesia Other Findings .Marland KitchenPast Medical History: No date: Chronic systolic CHF (congestive heart failure) (HCC) No date: CKD (chronic kidney disease), stage III (HCC) No date: Diabetes ([place]) No date: HBP (high blood pressure) No date: Heart attack ([place]) No date: High cholesterol No date: Stroke (cerebrum) (HCC) No date: Swelling   Reproductive/Obstetrics negative OB ROS                            Anesthesia Physical Anesthesia Plan  ASA: III  Anesthesia Plan: General   Post-op Pain Management:    Induction: Intravenous  PONV Risk Score and Plan: 2 and Propofol infusion  Airway Management Planned: Nasal Cannula  Additional Equipment:   Intra-op Plan:   Post-operative Plan:   Informed Consent: I have reviewed the patients History and Physical, chart, labs and discussed the procedure including the risks, benefits and alternatives for the proposed anesthesia with the patient or authorized representative who has indicated his/her understanding and acceptance.       Plan Discussed with: CRNA,  Anesthesiologist and Surgeon  Anesthesia Plan Comments:         Anesthesia Quick Evaluation

## 2019-12-28 NOTE — Anesthesia Postprocedure Evaluation (Signed)
Anesthesia Post Note  Patient: Ryan Mcclure  Procedure(s) Performed: COLONOSCOPY WITH PROPOFOL (N/A )  Patient location during evaluation: Endoscopy Anesthesia Type: General Level of consciousness: awake and awake and alert Pain management: pain level controlled Vital Signs Assessment: post-procedure vital signs reviewed and stable Respiratory status: spontaneous breathing Postop Assessment: no apparent nausea or vomiting Anesthetic complications: no   No complications documented.   Last Vitals:  Vitals:   12/28/19 1353 12/28/19 1434  BP: (!) 137/58   Pulse: 65   Resp: 16   Temp: (!) 36.1 C 36.8 C  SpO2: 98%     Last Pain:  Vitals:   12/28/19 1444  TempSrc:   PainSc: 0-No pain                 Neva Seat

## 2019-12-28 NOTE — Care Management Important Message (Signed)
Important Message  Patient Details  Name: Ryan Mcclure MRN: [id number] Date of Birth: 1931-08-14   Medicare Important Message Given:  Yes     Dannette Barbara 12/28/2019, 12:24 PM

## 2019-12-28 NOTE — Transfer of Care (Signed)
Immediate Anesthesia Transfer of Care Note  Patient: Ryan Mcclure  Procedure(s) Performed: COLONOSCOPY WITH PROPOFOL (N/A )  Patient Location: PACU and Endoscopy Unit  Anesthesia Type:General  Level of Consciousness: drowsy  Airway & Oxygen Therapy: Patient Spontanous Breathing  Post-op Assessment: Report given to RN  Post vital signs: stable  Last Vitals:  Vitals Value Taken Time  BP    Temp    Pulse    Resp    SpO2      Last Pain:  Vitals:   12/28/19 1353  TempSrc: Temporal  PainSc: 0-No pain         Complications: No complications documented.

## 2019-12-29 ENCOUNTER — Encounter: Payer: Self-pay | Admitting: Gastroenterology

## 2019-12-29 LAB — COMPREHENSIVE METABOLIC PANEL
ALT: 14 U/L (ref 0–44)
AST: 19 U/L (ref 15–41)
Albumin: 3.1 g/dL — ABNORMAL LOW (ref 3.5–5.0)
Alkaline Phosphatase: 69 U/L (ref 38–126)
Anion gap: 9 (ref 5–15)
BUN: 35 mg/dL — ABNORMAL HIGH (ref 8–23)
CO2: 26 mmol/L (ref 22–32)
Calcium: 9.2 mg/dL (ref 8.9–10.3)
Chloride: 107 mmol/L (ref 98–111)
Creatinine, Ser: 2.19 mg/dL — ABNORMAL HIGH (ref 0.61–1.24)
GFR, Estimated: 29 mL/min — ABNORMAL LOW (ref >60)
Glucose, Bld: 149 mg/dL — ABNORMAL HIGH (ref 70–99)
Potassium: 4.2 mmol/L (ref 3.5–5.1)
Sodium: 142 mmol/L (ref 135–145)
Total Bilirubin: 0.5 mg/dL (ref 0.3–1.2)
Total Protein: 6.3 g/dL — ABNORMAL LOW (ref 6.5–8.1)

## 2019-12-29 LAB — CBC
HCT: 26.5 % — ABNORMAL LOW (ref 39.0–52.0)
Hemoglobin: 8 g/dL — ABNORMAL LOW (ref 13.0–17.0)
MCH: 25.7 pg — ABNORMAL LOW (ref 26.0–34.0)
MCHC: 30.2 g/dL (ref 30.0–36.0)
MCV: 85.2 fL (ref 80.0–100.0)
Platelets: 99 10*3/uL — ABNORMAL LOW (ref 150–400)
RBC: 3.11 MIL/uL — ABNORMAL LOW (ref 4.22–5.81)
RDW: 15.9 % — ABNORMAL HIGH (ref 11.5–15.5)
WBC: 12.7 10*3/uL — ABNORMAL HIGH (ref 4.0–10.5)
nRBC: 0 % (ref 0.0–0.2)

## 2019-12-29 LAB — GLUCOSE, CAPILLARY
Glucose-Capillary: 126 mg/dL — ABNORMAL HIGH (ref 70–99)
Glucose-Capillary: 162 mg/dL — ABNORMAL HIGH (ref 70–99)
Glucose-Capillary: 201 mg/dL — ABNORMAL HIGH (ref 70–99)
Glucose-Capillary: 207 mg/dL — ABNORMAL HIGH (ref 70–99)
Glucose-Capillary: 227 mg/dL — ABNORMAL HIGH (ref 70–99)
Glucose-Capillary: 293 mg/dL — ABNORMAL HIGH (ref 70–99)

## 2019-12-29 MED ORDER — SODIUM CHLORIDE 0.9% FLUSH
3.0000 mL | Freq: Two times a day (BID) | INTRAVENOUS | Status: DC
  Administered 2019-12-29 – 2019-12-30 (×2): 3 mL via INTRAVENOUS

## 2019-12-29 MED ORDER — INSULIN ASPART 100 UNIT/ML ~~LOC~~ SOLN
0.0000 [IU] | Freq: Every day | SUBCUTANEOUS | Status: DC
  Administered 2019-12-29: 2 [IU] via SUBCUTANEOUS
  Filled 2019-12-29: qty 1

## 2019-12-29 MED ORDER — INSULIN ASPART 100 UNIT/ML ~~LOC~~ SOLN
0.0000 [IU] | Freq: Three times a day (TID) | SUBCUTANEOUS | Status: DC
  Administered 2019-12-29: 5 [IU] via SUBCUTANEOUS
  Administered 2019-12-29: 3 [IU] via SUBCUTANEOUS
  Administered 2019-12-29: 8 [IU] via SUBCUTANEOUS
  Administered 2019-12-30: 3 [IU] via SUBCUTANEOUS
  Filled 2019-12-29 (×4): qty 1

## 2019-12-29 NOTE — Discharge Instructions (Signed)
Please continue to hold your plavix for now and resume it on Monday (01/04/20)

## 2019-12-29 NOTE — Evaluation (Signed)
Physical Therapy Evaluation Patient Details Name: Ryan Mcclure MRN: [id number] DOB: 10-18-1931 Today's Date: 12/29/2019   History of Present Illness  86yo with hx of CAD s/p CABG, ischemic cardiomyopathy, DM, CKD4, and prior hx of diverticular bleed who presents to the ED with one day hx of BRBPR.    Clinical Impression  Ryan Mcclure is a very pleasant man with great willingness to work with PT. He demonstrates impairment with bed mobility requiring Mod A for supine<>sit transfer. The pt ambulates with use of RW with forward flexed trunk and increased distance to AD. The pt reports that he is not mobilizing at his baseline level of function at this time. His score on the [facility] indicates 47.12% impairment. At this time the pt is safe to d/c home with HHPT services and continuation of health aide. Pt is expected to d/c on 12/1, however is a high risk of decompensation and therefore should be followed in house if pt does not d/c.     Follow Up Recommendations Home health PT    Equipment Recommendations       Recommendations for Other Services       Precautions / Restrictions Precautions Precautions: None Restrictions Weight Bearing Restrictions: No      Mobility  Bed Mobility Overal bed mobility: Needs Assistance Bed Mobility: Supine to Sit     Supine to sit: Mod assist     General bed mobility comments: Pt reports having assitance from aide at baseline.    Transfers Overall transfer level: Needs assistance Equipment used: Rolling walker (2 wheeled) Transfers: Sit to/from Stand Sit to Stand: Modified independent (Device/Increase time)            Ambulation/Gait Ambulation/Gait assistance: Supervision Gait Distance (Feet): 50 Feet Assistive device: Rolling walker (2 wheeled) Gait Pattern/deviations: Trunk flexed;Decreased stride length Gait velocity: 0.47 Gait velocity interpretation: <1.31 ft/sec, indicative of household Chemical engineer Rankin (Stroke Patients Only)       Balance Overall balance assessment: Mild deficits observed, not formally tested                                           Pertinent Vitals/Pain Pain Assessment: No/denies pain    Home Living Family/patient expects to be discharged to:: Private residence (Independent living facility, [place]) Living Arrangements: Alone Available Help at Discharge: Personal care attendant Type of Home: House Home Access: Level entry     Home Layout: One level Home Equipment: Environmental consultant - 2 wheels      Prior Function Level of Independence: Independent with assistive device(s)         Comments: Reports using RW at baseline.     Hand Dominance        Extremity/Trunk Assessment   Upper Extremity Assessment Upper Extremity Assessment: Overall WFL for tasks assessed    Lower Extremity Assessment Lower Extremity Assessment: Overall WFL for tasks assessed    Cervical / Trunk Assessment Cervical / Trunk Assessment: Kyphotic  Communication   Communication: No difficulties  Cognition Arousal/Alertness: Awake/alert Behavior During Therapy: WFL for tasks assessed/performed Overall Cognitive Status: Within Functional Limits for tasks assessed  General Comments: PT with slowed reaction times to information, however is AxO to self, location, and month.      General Comments      Exercises     Assessment/Plan    PT Assessment All further PT needs can be met in the next venue of care  PT Problem List Decreased strength;Decreased mobility;Decreased activity tolerance;Decreased balance       PT Treatment Interventions Gait training;Balance training;Therapeutic activities    PT Goals (Current goals can be found in the Care Plan section)  Acute Rehab PT Goals Patient Stated Goal: Get home in the morning PT Goal Formulation: With patient Time  For Goal Achievement: 01/12/20 Potential to Achieve Goals: Good    Frequency Min 2X/week   Barriers to discharge        Co-evaluation               AM-PAC PT "6 Clicks" Mobility  Outcome Measure Help needed turning from your back to your side while in a flat bed without using bedrails?: A Little Help needed moving from lying on your back to sitting on the side of a flat bed without using bedrails?: A Lot Help needed moving to and from a bed to a chair (including a wheelchair)?: A Little Help needed standing up from a chair using your arms (e.g., wheelchair or bedside chair)?: A Little Help needed to walk in hospital room?: A Little Help needed climbing 3-5 steps with a railing? : A Lot 6 Click Score: 16    End of Session Equipment Utilized During Treatment: Gait belt Activity Tolerance: Patient tolerated treatment well;No increased pain Patient left: in bed (OT entering room. Pt sitting at EOB. PT did not leave until OT foamed in and ready to receive patient.) Nurse Communication: Mobility status PT Visit Diagnosis: Unsteadiness on feet (R26.81);Muscle weakness (generalized) (M62.81);Other abnormalities of gait and mobility (R26.89)    Time: 6063-0160 PT Time Calculation (min) (ACUTE ONLY): 26 min   Charges:   PT Evaluation $PT Eval Low Complexity: 1 Low PT Treatments $Therapeutic Activity: 8-22 mins      3:42 PM, 12/29/19 Eriberto Felch A. Saverio Danker PT, DPT Physical Therapist - [facility]   Corie Allis A Lauralie Blacksher 12/29/2019, 3:38 PM

## 2019-12-29 NOTE — Plan of Care (Signed)
  Problem: Education: Goal: Knowledge of General Education information will improve Description: Including pain rating scale, medication(s)/side effects and non-pharmacologic comfort measures Outcome: Progressing   Problem: Health Behavior/Discharge Planning: Goal: Ability to manage health-related needs will improve Outcome: Progressing   Problem: Clinical Measurements: Goal: Will remain free from infection Outcome: Progressing Goal: Diagnostic test results will improve Outcome: Progressing   Problem: Activity: Goal: Risk for activity intolerance will decrease Outcome: Progressing   Problem: Nutrition: Goal: Adequate nutrition will be maintained Outcome: Progressing   Problem: Coping: Goal: Level of anxiety will decrease Outcome: Progressing   Problem: Elimination: Goal: Will not experience complications related to bowel motility Outcome: Progressing Note: Pt has had one water like stool that was brown in color.  Goal: Will not experience complications related to urinary retention Outcome: Progressing   Problem: Pain Managment: Goal: General experience of comfort will improve Outcome: Progressing   Problem: Safety: Goal: Ability to remain free from injury will improve Outcome: Progressing   Problem: Skin Integrity: Goal: Risk for impaired skin integrity will decrease Outcome: Progressing

## 2019-12-29 NOTE — TOC Initial Note (Signed)
Transition of Care [facility]) - Initial/Assessment Note    Patient Details  Name: Ryan Mcclure MRN: [id number] Date of Birth: Jun 01, 1931  [facility]) CM/SW Contact:    Eileen Stanford, LCSW Phone Number: 12/29/2019, 1:05 PM  Clinical Narrative:    Pt is only alert to self. CSW spoke with pt's sister via telephoe. Pt's sister states pt lives at [facility] and has respite care aids. Pt was medically stable for d/c today however, pt's sister has to get the aids set up so she is asking that d/c be tomorrow so a safe d/c can be in place. MD notified. Pt will need [place] services as well.CSW will arranged.               Expected Discharge Plan: [place] Barriers to Discharge: Continued Medical Work up   Patient Goals and CMS Choice Patient states their goals for this hospitalization and ongoing recovery are:: to get pt back to ILF   Choice offered to / list presented to : Sibling  Expected Discharge Plan and Services Expected Discharge Plan: [place] Choice: [place] arrangements for the past 2 months: [place] Expected Discharge Date: 12/29/19                         HH Arranged: RN, PT, OT HH Agency: [facility] ([place]) Date HH Agency Contacted: 12/29/19 Time [place]: 1304 Representative spoke with at [place]  Prior Living Arrangements/Services Living arrangements for the past 2 months: [place] Lives with:: Self Patient language and need for interpreter reviewed:: Yes Do you feel safe going back to the place where you live?: Yes      Need for Family Participation in Patient Care: Yes (Comment) Care giver support system in place?: Yes (comment) Current home services: Other (comment) (respite care aids) Criminal Activity/Legal Involvement Pertinent to Current Situation/Hospitalization: No - Comment as needed  Activities of Daily  Living Home Assistive Devices/Equipment: Bedside commode/3-in-1, Cane (specify quad or straight), Walker (specify type) ADL Screening (condition at time of admission) Patient's cognitive ability adequate to safely complete daily activities?: Yes Is the patient deaf or have difficulty hearing?: No Does the patient have difficulty seeing, even when wearing glasses/contacts?: No Does the patient have difficulty concentrating, remembering, or making decisions?: No Patient able to express need for assistance with ADLs?: Yes Does the patient have difficulty dressing or bathing?: Yes Independently performs ADLs?: No Communication: Independent Dressing (OT): Needs assistance Is this a change from baseline?: Pre-admission baseline Grooming: Needs assistance Is this a change from baseline?: Pre-admission baseline Feeding: Needs assistance Is this a change from baseline?: Pre-admission baseline Bathing: Needs assistance Is this a change from baseline?: Pre-admission baseline Toileting: Needs assistance Is this a change from baseline?: Pre-admission baseline In/Out Bed: Needs assistance Is this a change from baseline?: Pre-admission baseline Walks in Home: Needs assistance Is this a change from baseline?: Pre-admission baseline Does the patient have difficulty walking or climbing stairs?: Yes Weakness of Legs: Both Weakness of Arms/Hands: None  Permission Sought/Granted Permission sought to share information with : Family Supports    Share Information with NAME: Stanton Kidney  Permission granted to share info w AGENCY: [facility]  Permission granted to share info w Relationship: sister     Emotional Assessment Appearance:: Appears stated age Attitude/Demeanor/Rapport: Unable to Assess Affect (typically observed): Unable to  Assess Orientation: : Oriented to Self Alcohol / Substance Use: Not Applicable Psych Involvement: No (comment)  Admission diagnosis:  GIB (gastrointestinal bleeding)  [K92.2] Lower GI bleed [K92.2] Patient Active Problem List   Diagnosis Date Noted  . CKD stage 4 due to type 2 diabetes mellitus ([place]) 12/25/2019  . History of GI diverticular bleed 12/25/2019  . History of CVA (cerebrovascular accident) 12/25/2019  . History of aortic valve replacement with bioprosthetic valve 12/25/2019  . Cerebrovascular accident (CVA) due to occlusion of left posterior cerebral artery ([place]) 11/20/2017  . Hyperlipidemia due to type 2 diabetes mellitus ([place]) 09/26/2017  . Ulcer 09/26/2017  . Stroke ([place]) 08/15/2017  . Carotid stenosis 01/31/2016  . Hematochezia   . Diverticulosis of large intestine without hemorrhage   . Hemorrhoid   . GIB (gastrointestinal bleeding) 10/18/2015  . Acute blood loss anemia 10/18/2015  . CKD (chronic kidney disease), stage III ([place])   . Chronic systolic CHF (congestive heart failure) ([place])   . Stroke (cerebrum) ([place])   . Cardiomyopathy, ischemic 10/12/2015  . Shortness of breath 09/14/2015  . Absolute anemia 06/10/2015  . Cataract cortical, senile 06/10/2015  . Controlled type 2 diabetes mellitus without complication ([place]) 38/18/2993  . Acid reflux 06/10/2015  . Combined fat and carbohydrate induced hyperlipemia 06/10/2015  . Cerebrovascular accident (CVA) ([place]) 06/10/2015  . Non-pressure chronic ulcer of skin of other sites with unspecified severity ([place]) 06/10/2015  . Benign essential HTN 08/25/2014  . Hypertension associated with diabetes ([place]) 08/25/2014  . Arteriosclerosis of coronary artery 09/09/2013  . CAD (coronary artery disease) 09/09/2013  . Microalbuminuria 09/08/2013  . Diabetes mellitus ([place]) 09/08/2013   PCP:  [place] Pink, MD Pharmacy:   [facility] [place], [place] - [place] [address] [place] [address] Phone: [phone] Fax: [place], [place] [address] Phone: [phone] Fax: [phone]     Social Determinants of Health (SDOH) Interventions    Readmission Risk Interventions No flowsheet data found.

## 2019-12-29 NOTE — Discharge Summary (Signed)
Physician Discharge Summary  Ryan Mcclure WNU:[id number] DOB: [birth year] DOA: 12/25/2019  PCP: Maryland Pink, MD  Admit date: 12/25/2019 Discharge date: 12/29/2019  Admitted From: Home Disposition:  Home  Recommendations for Outpatient Follow-up:  1. Follow up with PCP in 1-2 weeks 2. Continue to hold plavix x 1 week  Home Health:PT/OT   Discharge Condition:Stable CODE STATUS:Full Diet recommendation: Diabetic   Brief/Interim Summary: 86yo with hx of CAD s/p CABG, ischemic cardiomyopathy, DM, CKD4, and prior hx of diverticular bleed who presents to the ED with one day hx of BRBPR. Hospitalist consulted for consideration for admission  Discharge Diagnoses:  Principal Problem:   Hematochezia Active Problems:   Diabetes mellitus ([place])   GIB (gastrointestinal bleeding)   Chronic systolic CHF (congestive heart failure) (HCC)   Acute blood loss anemia   Hypertension associated with diabetes ([place])   CKD stage 4 due to type 2 diabetes mellitus ([place])   History of GI diverticular bleed   CAD (coronary artery disease)   History of CVA (cerebrovascular accident)   History of aortic valve replacement with bioprosthetic valve   Hematochezia/lower GI bleed History of recurrentGI bleed Acute blood loss anemia -Patient with history of diverticular bleed presenting with history of bright red blood per rectum and downward trending hemoglobin -Patient remained hemodynamically stable -Patient did require 2 units of PRBC's on 11/27 -GI following.  -Pt underwent colonoscopy today, reviewed. Findings of diverticulosis with a 4mm polyp in ascending colon which was not removed given recent plavix -Discussed with GI. Pt is clear for d/c today. 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 Have resumed -Follows with Dr. Nehemiah Massed. -Last echo on chart review 2019 with EF 60 to 65%  Hypertension associated with diabetes ([place]) -antihypertensives were held given concerns of acute blood loss -Continue home meds   CKD stage 4 due to type 2 diabetes mellitus ([place]) -Current Creatinine 2.33. Was 2.21 a month prior -Cont home meds as tolerated  CADwith history of CABG(coronary artery disease) -No complaints of chest pain  -Nitroglycerin sublingual as needed chest pain -Holding Plavix for now secondary to blood loss anemia.  -Cont metoprolol, imdur, ARB per home regimen per above  History of CVAwithout residual deficit(cerebrovascular accident) History of carotid endarterectomy -Holding Plavix per above. Will continue statin per home regimen  History of aortic valve replacement with bioprosthetic valve -No acute disease, seems stable at this time  Discharge Instructions   Allergies as of 12/29/2019      Reactions   Neuromuscular Blocking Agents Nausea And Vomiting, Other (See Comments)   Tongue swelling, lips swelling    Other Other (See Comments)   Tongue/lips swelling with steroid dose pack, pt does not recall exact name.    Shrimp [shellfish Allergy]       Medication List    TAKE these medications   amLODipine 5 MG tablet Commonly known as: NORVASC Take 5 mg by mouth 2 (two) times daily.   atorvastatin 40 MG tablet Commonly known as: LIPITOR Take 40 mg by mouth daily.   Basaglar KwikPen 100 UNIT/ML Inject 20 Units into the skin at bedtime.   clopidogrel 75 MG tablet Commonly known as: PLAVIX Take 75 mg by mouth at bedtime.   ferrous sulfate 325 (65 FE) MG tablet Take  325 mg by mouth 3 (three) times daily with meals.   fluticasone 50 MCG/ACT nasal spray Commonly known as: FLONASE Place 1 spray into both nostrils daily  as needed for allergies.   furosemide 20 MG tablet Commonly known as: LASIX Take 40 mg by mouth 2 (two) times daily.   insulin aspart 100 UNIT/ML FlexPen Commonly known as: NOVOLOG Inject 4-14 Units into the skin See admin instructions. Inject under the skin according to sliding scale: Breakfast (4u <100 or 8u >100) Lunch (6u <100 or 12u >100) Supper (7u <100 or 14u >100)   isosorbide mononitrate 30 MG 24 hr tablet Commonly known as: IMDUR Take 30 mg by mouth daily.   metFORMIN 500 MG tablet Commonly known as: GLUCOPHAGE Take 500 mg by mouth 2 (two) times daily.   metoprolol tartrate 50 MG tablet Commonly known as: LOPRESSOR TAKE 1 TABLET BY MOUTH TWICE DAILY   Nitrostat 0.4 MG SL tablet Generic drug: nitroGLYCERIN take 1 tablet under the tongue every 5 minutes if needed for chest pain   omeprazole 40 MG capsule Commonly known as: PRILOSEC Take 40 mg by mouth 2 (two) times daily.   RA Vitamin B-12 TR 1000 MCG Tbcr Generic drug: Cyanocobalamin Take 1,000 mcg by mouth daily.   telmisartan 80 MG tablet Commonly known as: MICARDIS Take 1 tablet by mouth daily.       Follow-up Information    Maryland Pink, MD. Schedule an appointment as soon as possible for a visit in 2 week(s).   Specialty: Family Medicine Contact information: [address] [place] [address] [phone]              Allergies  Allergen Reactions  . Neuromuscular Blocking Agents Nausea And Vomiting and Other (See Comments)    Tongue swelling, lips swelling   . Other Other (See Comments)    Tongue/lips swelling with steroid dose pack, pt does not recall exact name.   Marland Kitchen Shrimp [Shellfish Allergy]     Consultations:  GI  Procedures/Studies:  No results found.  Subjective: Eager to go home  Discharge Exam: Vitals:   12/29/19 0801 12/29/19 1139  BP: (!) 142/57 (!) 149/61  Pulse: 79 84  Resp: 18 18  Temp: 98.2 F (36.8 C) 98 F (36.7 C)  SpO2: 99% 100%   Vitals:    12/29/19 0421 12/29/19 0455 12/29/19 0801 12/29/19 1139  BP: (!) 144/58 (!) 149/70 (!) 142/57 (!) 149/61  Pulse: 76 73 79 84  Resp: 20 18 18 18   Temp: 97.7 F (36.5 C) (!) 97.5 F (36.4 C) 98.2 F (36.8 C) 98 F (36.7 C)  TempSrc: Oral Oral Oral Oral  SpO2: 100% 99% 99% 100%  Weight:  94.1 kg    Height:  5\' 7"  (1.702 m)      General: Pt is alert, awake, not in acute distress Cardiovascular: RRR, S1/S2 +, no rubs, no gallops Respiratory: CTA bilaterally, no wheezing, no rhonchi Abdominal: Soft, NT, ND, bowel sounds + Extremities: no edema, no cyanosis   The results of significant diagnostics from this hospitalization (including imaging, microbiology, ancillary and laboratory) are listed below for reference.     Microbiology: Recent Results (from the past 240 hour(s))  Resp Panel by RT-PCR (Flu A&B, Covid) Nasopharyngeal Swab     Status: None   Collection Time: 12/25/19 11:15 PM   Specimen: Nasopharyngeal Swab; Nasopharyngeal(NP) swabs in vial transport medium  Result Value Ref Range Status   SARS Coronavirus 2 by RT PCR NEGATIVE NEGATIVE Final  Comment: (NOTE) SARS-CoV-2 target nucleic acids are NOT DETECTED.  The SARS-CoV-2 RNA is generally detectable in upper respiratory specimens during the acute phase of infection. The lowest concentration of SARS-CoV-2 viral copies this assay can detect is 138 copies/mL. A negative result does not preclude SARS-Cov-2 infection and should not be used as the sole basis for treatment or other patient management decisions. A negative result may occur with  improper specimen collection/handling, submission of specimen other than nasopharyngeal swab, presence of viral mutation(s) within the areas targeted by this assay, and inadequate number of viral copies(<138 copies/mL). A negative result must be combined with clinical observations, patient history, and epidemiological information. The expected result is Negative.  Fact Sheet for  Patients:  EntrepreneurPulse.com.au  Fact Sheet for Healthcare Providers:  IncredibleEmployment.be  This test is no t yet approved or cleared by the [place] FDA and  has been authorized for detection and/or diagnosis of SARS-CoV-2 by FDA under an Emergency Use Authorization (EUA). This EUA will remain  in effect (meaning this test can be used) for the duration of the COVID-19 declaration under Section 564(b)(1) of the Act, 21 U.S.C.section 360bbb-3(b)(1), unless the authorization is terminated  or revoked sooner.       Influenza A by PCR NEGATIVE NEGATIVE Final   Influenza B by PCR NEGATIVE NEGATIVE Final    Comment: (NOTE) The Xpert Xpress SARS-CoV-2/FLU/RSV plus assay is intended as an aid in the diagnosis of influenza from Nasopharyngeal swab specimens and should not be used as a sole basis for treatment. Nasal washings and aspirates are unacceptable for Xpert Xpress SARS-CoV-2/FLU/RSV testing.  Fact Sheet for Patients: EntrepreneurPulse.com.au  Fact Sheet for Healthcare Providers: IncredibleEmployment.be  This test is not yet approved or cleared by the [place] FDA and has been authorized for detection and/or diagnosis of SARS-CoV-2 by FDA under an Emergency Use Authorization (EUA). This EUA will remain in effect (meaning this test can be used) for the duration of the COVID-19 declaration under Section 564(b)(1) of the Act, 21 U.S.C. section 360bbb-3(b)(1), unless the authorization is terminated or revoked.  Performed at [facility], [address] [place], [place] [address]      Labs: BNP (last 3 results) Recent Labs    11/07/19 0437  BNP 505.6*   Basic Metabolic Panel: Recent Labs  Lab 12/25/19 1731 12/27/19 0602 12/28/19 0542 12/29/19 0425  NA 142 142 146* 142  K 4.6 4.6 4.5 4.2  CL 107 113* 114* 107  CO2 26 22 24 26   GLUCOSE 142* 104* 130* 149*  BUN 55*  44* 42* 35*  CREATININE 2.33* 1.89* 2.33* 2.19*  CALCIUM 9.2 8.6* 8.8* 9.2   Liver Function Tests: Recent Labs  Lab 12/25/19 1731 12/27/19 0602 12/28/19 0542 12/29/19 0425  AST 18 18 15 19   ALT 13 8 10 14   ALKPHOS 82 56 59 69  BILITOT 0.4 1.0 0.8 0.5  PROT 7.5 5.5* 5.7* 6.3*  ALBUMIN 3.5 2.7* 2.9* 3.1*   No results for input(s): LIPASE, AMYLASE in the last 168 hours. No results for input(s): AMMONIA in the last 168 hours. CBC: Recent Labs  Lab 12/25/19 1731 12/25/19 2315 12/26/19 1233 12/26/19 1731 12/27/19 0708 12/28/19 0542 12/29/19 0425  WBC 10.0  --   --   --  12.1* 11.3* 12.7*  HGB 10.2*   < > 8.6* 10.2* 8.6* 7.6* 8.0*  HCT 33.3*   < > 28.0* 31.9* 27.2* 24.3* 26.5*  MCV 88.1  --   --   --  82.9 84.4 85.2  PLT 136*  --   --   --  93* 87* 99*   < > = values in this interval not displayed.   Cardiac Enzymes: No results for input(s): CKTOTAL, CKMB, CKMBINDEX, TROPONINI in the last 168 hours. BNP: Invalid input(s): POCBNP CBG: Recent Labs  Lab 12/28/19 2044 12/29/19 0040 12/29/19 0455 12/29/19 0759 12/29/19 1141  GLUCAP 267* 207* 126* 162* 293*   D-Dimer No results for input(s): DDIMER in the last 72 hours. Hgb A1c No results for input(s): HGBA1C in the last 72 hours. Lipid Profile No results for input(s): CHOL, HDL, LDLCALC, TRIG, CHOLHDL, LDLDIRECT in the last 72 hours. Thyroid function studies No results for input(s): TSH, T4TOTAL, T3FREE, THYROIDAB in the last 72 hours.  Invalid input(s): FREET3 Anemia work up No results for input(s): VITAMINB12, FOLATE, FERRITIN, TIBC, IRON, RETICCTPCT in the last 72 hours. Urinalysis    Component Value Date/Time   COLORURINE STRAW (A) 08/15/2017 1037   APPEARANCEUR CLEAR (A) 08/15/2017 1037   APPEARANCEUR Clear 07/11/2012 2124   LABSPEC 1.006 08/15/2017 1037   LABSPEC 1.013 07/11/2012 2124   PHURINE 7.0 08/15/2017 1037   GLUCOSEU NEGATIVE 08/15/2017 1037   GLUCOSEU Negative 07/11/2012 2124   HGBUR  NEGATIVE 08/15/2017 1037   BILIRUBINUR NEGATIVE 08/15/2017 1037   BILIRUBINUR Negative 07/11/2012 2124   KETONESUR NEGATIVE 08/15/2017 1037   PROTEINUR NEGATIVE 08/15/2017 1037   NITRITE NEGATIVE 08/15/2017 1037   LEUKOCYTESUR NEGATIVE 08/15/2017 1037   LEUKOCYTESUR Negative 07/11/2012 2124   Sepsis Labs Invalid input(s): PROCALCITONIN,  WBC,  LACTICIDVEN Microbiology Recent Results (from the past 240 hour(s))  Resp Panel by RT-PCR (Flu A&B, Covid) Nasopharyngeal Swab     Status: None   Collection Time: 12/25/19 11:15 PM   Specimen: Nasopharyngeal Swab; Nasopharyngeal(NP) swabs in vial transport medium  Result Value Ref Range Status   SARS Coronavirus 2 by RT PCR NEGATIVE NEGATIVE Final    Comment: (NOTE) SARS-CoV-2 target nucleic acids are NOT DETECTED.  The SARS-CoV-2 RNA is generally detectable in upper respiratory specimens during the acute phase of infection. The lowest concentration of SARS-CoV-2 viral copies this assay can detect is 138 copies/mL. A negative result does not preclude SARS-Cov-2 infection and should not be used as the sole basis for treatment or other patient management decisions. A negative result may occur with  improper specimen collection/handling, submission of specimen other than nasopharyngeal swab, presence of viral mutation(s) within the areas targeted by this assay, and inadequate number of viral copies(<138 copies/mL). A negative result must be combined with clinical observations, patient history, and epidemiological information. The expected result is Negative.  Fact Sheet for Patients:  EntrepreneurPulse.com.au  Fact Sheet for Healthcare Providers:  IncredibleEmployment.be  This test is no t yet approved or cleared by the [place] FDA and  has been authorized for detection and/or diagnosis of SARS-CoV-2 by FDA under an Emergency Use Authorization (EUA). This EUA will remain  in effect (meaning this  test can be used) for the duration of the COVID-19 declaration under Section 564(b)(1) of the Act, 21 U.S.C.section 360bbb-3(b)(1), unless the authorization is terminated  or revoked sooner.       Influenza A by PCR NEGATIVE NEGATIVE Final   Influenza B by PCR NEGATIVE NEGATIVE Final    Comment: (NOTE) The Xpert Xpress SARS-CoV-2/FLU/RSV plus assay is intended as an aid in the diagnosis of influenza from Nasopharyngeal swab specimens and should not be used as a sole basis for treatment. Nasal washings and aspirates are  unacceptable for Xpert Xpress SARS-CoV-2/FLU/RSV testing.  Fact Sheet for Patients: EntrepreneurPulse.com.au  Fact Sheet for Healthcare Providers: IncredibleEmployment.be  This test is not yet approved or cleared by the [place] FDA and has been authorized for detection and/or diagnosis of SARS-CoV-2 by FDA under an Emergency Use Authorization (EUA). This EUA will remain in effect (meaning this test can be used) for the duration of the COVID-19 declaration under Section 564(b)(1) of the Act, 21 U.S.C. section 360bbb-3(b)(1), unless the authorization is terminated or revoked.  Performed at [facility], [address], [place], [place] [address]    Time spent: 30 min  SIGNED:   Marylu Lund, MD  [facility] 12/29/2019, 12:45 PM  If 7PM-7AM, please contact night-coverage

## 2019-12-29 NOTE — Progress Notes (Signed)
Pt in bed. Pt alert and oriented x 4. Pt states he has no pain. Pt has has gotten up once to the [facility]. Pt left eye crusted over with drainage, pt stated that this has been occurring for about a week. I cleaned pt eye and will continue to monitor throughout my shift. Pt has been unsuccessful with using the bed pan. Will continue pt care.

## 2019-12-29 NOTE — Evaluation (Signed)
Occupational Therapy Evaluation Patient Details Name: Ryan Mcclure MRN: [id number] DOB: November 23, 1931 Today's Date: 12/29/2019    History of Present Illness 86yo with hx of CAD s/p CABG, ischemic cardiomyopathy, DM, CKD4, and prior hx of diverticular bleed who presents to the ED with one day hx of BRBPR.   Clinical Impression   Patient presenting with decreased I in self care, balance, functional mobility/transfer, endurance, and safety awareness. Patient reports living at independent living facility with assistance from respite care PTA for self care and IADL tasks. Patient currently functioning at min A overall for functional transfers and standing tasks. Pt needing mod A for LB dressing and hygiene after being incontinent of urine while standing. However, pt reports needing assistance with LB self care at baseline.  Patient will benefit from acute OT to increase overall independence in the areas of ADLs, functional mobility, and safety awareness in order to safely discharge home.     Follow Up Recommendations  Home health OT;Supervision - Intermittent    Equipment Recommendations  None recommended by OT       Precautions / Restrictions Precautions Precautions: None Restrictions Weight Bearing Restrictions: No      Mobility Bed Mobility Overal bed mobility: Needs Assistance Bed Mobility: Sit to Supine     Supine to sit: Mod assist Sit to supine: Mod assist   General bed mobility comments: mod A for B LEs    Transfers Overall transfer level: Needs assistance Equipment used: Rolling walker (2 wheeled) Transfers: Sit to/from Stand Sit to Stand: Min assist              Balance Overall balance assessment: Mild deficits observed, not formally tested          ADL either performed or assessed with clinical judgement   ADL Overall ADL's : Needs assistance/impaired     Grooming: Oral care;Standing;Min guard       Lower Body Bathing: Minimal assistance;Sit  to/from stand       Lower Body Dressing: Moderate assistance;Sit to/from stand    General ADL Comments: Pt urinating on self and floor. Pt sitting on EOB and able to wash peri area and part of LEs with therapist assisting to wash B feet and set up A to don clean gown and total A to don B socks.     Vision Baseline Vision/History: Wears glasses Wears Glasses: At all times Patient Visual Report: No change from baseline              Pertinent Vitals/Pain Pain Assessment: No/denies pain     Hand Dominance Right   Extremity/Trunk Assessment Upper Extremity Assessment Upper Extremity Assessment: Overall WFL for tasks assessed   Lower Extremity Assessment Lower Extremity Assessment: Overall WFL for tasks assessed   Cervical / Trunk Assessment Cervical / Trunk Assessment: Kyphotic   Communication Communication Communication: No difficulties   Cognition Arousal/Alertness: Awake/alert Behavior During Therapy: WFL for tasks assessed/performed Overall Cognitive Status: Within Functional Limits for tasks assessed       General Comments: no family present to determine baseline. Pt does appear to process information slowly. Pt standing and urinating on floor and unable to verbalize need or that he was dirty.              Home Living Family/patient expects to be discharged to:: Private residence (independent living facility at [facility]) Living Arrangements: Alone Available Help at Discharge: Personal care attendant Type of Home: House Home Access: Level entry     Home Layout:  One level     Bathroom Shower/Tub: Walk-in shower         Home Equipment: Environmental consultant - 2 wheels;Grab bars - tub/shower;Hand held shower head          Prior Functioning/Environment Level of Independence: Independent with assistive device(s)        Comments: Reports using RW at baseline.        OT Problem List: Decreased strength;Decreased activity tolerance;Decreased safety  awareness;Impaired balance (sitting and/or standing);Decreased knowledge of use of DME or AE;Decreased knowledge of precautions      OT Treatment/Interventions: Self-care/ADL training;Therapeutic exercise;Therapeutic activities;DME and/or AE instruction;Patient/family education;Balance training;Energy conservation    OT Goals(Current goals can be found in the care plan section) Acute Rehab OT Goals Patient Stated Goal: go home OT Goal Formulation: With patient Time For Goal Achievement: 01/12/20 Potential to Achieve Goals: Good ADL Goals Pt Will Perform Grooming: with supervision;standing Pt Will Transfer to Toilet: with supervision;ambulating Pt Will Perform Toileting - Clothing Manipulation and hygiene: with supervision;sit to/from stand  OT Frequency: Min 1X/week   Barriers to D/C:    none at this time          AM-PAC OT "6 Clicks" Daily Activity     Outcome Measure Help from another person eating meals?: None Help from another person taking care of personal grooming?: A Little Help from another person toileting, which includes using toliet, bedpan, or urinal?: A Lot Help from another person bathing (including washing, rinsing, drying)?: A Lot Help from another person to put on and taking off regular upper body clothing?: A Little Help from another person to put on and taking off regular lower body clothing?: A Lot 6 Click Score: 16   End of Session Equipment Utilized During Treatment: Rolling walker Nurse Communication: Mobility status  Activity Tolerance: Patient tolerated treatment well Patient left: in bed;with call bell/phone within reach  OT Visit Diagnosis: Unsteadiness on feet (R26.81);Muscle weakness (generalized) (M62.81)                Time: 7416-3845 OT Time Calculation (min): 25 min Charges:  OT General Charges $OT Visit: 1 Visit OT Evaluation $OT Eval Low Complexity: 1 Low OT Treatments $Self Care/Home Management : 8-22 mins  Darleen Crocker, MS, OTR/L  , CBIS ascom [phone]  12/29/19, 4:32 PM

## 2019-12-30 LAB — GLUCOSE, CAPILLARY: Glucose-Capillary: 170 mg/dL — ABNORMAL HIGH (ref 70–99)

## 2019-12-30 NOTE — Progress Notes (Signed)
Patient discharged as per order, al discharge instruction provided to patient and caregiver prior patient discharged

## 2019-12-30 NOTE — Plan of Care (Signed)
  Problem: Elimination: Goal: Will not experience complications related to bowel motility Outcome: Progressing No stools or signs of active bleed this shift.

## 2020-02-01 ENCOUNTER — Ambulatory Visit: Payer: Medicare Other | Admitting: Podiatry

## 2020-02-08 ENCOUNTER — Other Ambulatory Visit: Payer: Self-pay

## 2020-02-08 ENCOUNTER — Encounter: Payer: Self-pay | Admitting: Podiatry

## 2020-02-08 ENCOUNTER — Ambulatory Visit (INDEPENDENT_AMBULATORY_CARE_PROVIDER_SITE_OTHER): Payer: Medicare Other | Admitting: Podiatry

## 2020-02-08 DIAGNOSIS — B351 Tinea unguium: Secondary | ICD-10-CM | POA: Diagnosis not present

## 2020-02-08 DIAGNOSIS — N184 Chronic kidney disease, stage 4 (severe): Secondary | ICD-10-CM

## 2020-02-08 DIAGNOSIS — E1122 Type 2 diabetes mellitus with diabetic chronic kidney disease: Secondary | ICD-10-CM | POA: Diagnosis not present

## 2020-02-08 DIAGNOSIS — D689 Coagulation defect, unspecified: Secondary | ICD-10-CM | POA: Diagnosis not present

## 2020-02-08 DIAGNOSIS — M79609 Pain in unspecified limb: Secondary | ICD-10-CM | POA: Diagnosis not present

## 2020-02-08 DIAGNOSIS — E1142 Type 2 diabetes mellitus with diabetic polyneuropathy: Secondary | ICD-10-CM

## 2020-02-08 NOTE — Progress Notes (Signed)
This patient returns to my office for at risk foot care.  This patient requires this care by a professional since this patient will be at risk due to having chronic kidney disease and diabetes.  This patient is unable to cut nails himself since the patient cannot reach his nails.These nails are painful walking and wearing shoes.  This patient presents for at risk foot care today.  General Appearance  Alert, conversant and in no acute stress.  Vascular  Dorsalis pedis and posterior tibial  pulses are weakly  palpable  bilaterally.  Capillary return is within normal limits  bilaterally. Temperature is within normal limits  Bilaterally. Absent digital hair.   Neurologic  Senn-Weinstein monofilament wire test within normal limits  bilaterally. Muscle power within normal limits bilaterally.  Nails Thick disfigured discolored nails with subungual debris  from hallux to fifth toes bilaterally. No evidence of bacterial infection or drainage bilaterally.  Orthopedic  No limitations of motion  feet .  No crepitus or effusions noted.  No bony pathology or digital deformities noted.  Skin  normotropic skin with no porokeratosis noted bilaterally.  No signs of infections or ulcers noted.     Onychomycosis  Pain in right toes  Pain in left toes  Consent was obtained for treatment procedures.   Mechanical debridement of nails 1-5  bilaterally performed with a nail nipper.  Filed with dremel without incident.    Return office visit 3 months                  Told patient to return for periodic foot care and evaluation due to potential at risk complications.   Gardiner Barefoot DPM

## 2020-02-16 ENCOUNTER — Encounter (INDEPENDENT_AMBULATORY_CARE_PROVIDER_SITE_OTHER): Payer: Medicare Other

## 2020-02-16 ENCOUNTER — Ambulatory Visit (INDEPENDENT_AMBULATORY_CARE_PROVIDER_SITE_OTHER): Payer: Medicare Other | Admitting: Vascular Surgery

## 2020-02-29 ENCOUNTER — Ambulatory Visit (INDEPENDENT_AMBULATORY_CARE_PROVIDER_SITE_OTHER): Payer: Medicare Other | Admitting: Vascular Surgery

## 2020-02-29 ENCOUNTER — Other Ambulatory Visit: Payer: Self-pay

## 2020-02-29 ENCOUNTER — Encounter (INDEPENDENT_AMBULATORY_CARE_PROVIDER_SITE_OTHER): Payer: Self-pay | Admitting: *Deleted

## 2020-02-29 ENCOUNTER — Ambulatory Visit (INDEPENDENT_AMBULATORY_CARE_PROVIDER_SITE_OTHER): Payer: Medicare Other

## 2020-02-29 DIAGNOSIS — I6523 Occlusion and stenosis of bilateral carotid arteries: Secondary | ICD-10-CM | POA: Diagnosis not present

## 2020-04-07 ENCOUNTER — Ambulatory Visit: Payer: Medicare Other | Admitting: Podiatry

## 2020-05-09 ENCOUNTER — Ambulatory Visit (INDEPENDENT_AMBULATORY_CARE_PROVIDER_SITE_OTHER): Payer: Medicare Other | Admitting: Podiatry

## 2020-05-09 ENCOUNTER — Other Ambulatory Visit: Payer: Self-pay

## 2020-05-09 ENCOUNTER — Encounter: Payer: Self-pay | Admitting: Podiatry

## 2020-05-09 DIAGNOSIS — N184 Chronic kidney disease, stage 4 (severe): Secondary | ICD-10-CM

## 2020-05-09 DIAGNOSIS — D689 Coagulation defect, unspecified: Secondary | ICD-10-CM | POA: Diagnosis not present

## 2020-05-09 DIAGNOSIS — E1122 Type 2 diabetes mellitus with diabetic chronic kidney disease: Secondary | ICD-10-CM

## 2020-05-09 DIAGNOSIS — B351 Tinea unguium: Secondary | ICD-10-CM

## 2020-05-09 DIAGNOSIS — M79609 Pain in unspecified limb: Secondary | ICD-10-CM

## 2020-05-09 DIAGNOSIS — E1142 Type 2 diabetes mellitus with diabetic polyneuropathy: Secondary | ICD-10-CM | POA: Diagnosis not present

## 2020-05-09 NOTE — Progress Notes (Signed)
This patient returns to my office for at risk foot care.  This patient requires this care by a professional since this patient will be at risk due to having chronic kidney disease and diabetes.  This patient is unable to cut nails himself since the patient cannot reach his nails.These nails are painful walking and wearing shoes. He presents to the office with his daughter.   This patient presents for at risk foot care today.  General Appearance  Alert, conversant and in no acute stress.  Vascular  Dorsalis pedis and posterior tibial  pulses are weakly  palpable  bilaterally.  Capillary return is within normal limits  bilaterally. Temperature is within normal limits  Bilaterally. Absent digital hair.   Neurologic  Senn-Weinstein monofilament wire test within normal limits  bilaterally. Muscle power within normal limits bilaterally.  Nails Thick disfigured discolored nails with subungual debris  from hallux to fifth toes bilaterally. No evidence of bacterial infection or drainage bilaterally.  Orthopedic  No limitations of motion  feet .  No crepitus or effusions noted.  No bony pathology or digital deformities noted.  Skin  normotropic skin with no porokeratosis noted bilaterally.  No signs of infections or ulcers noted.     Onychomycosis  Pain in right toes  Pain in left toes  Consent was obtained for treatment procedures.   Mechanical debridement of nails 1-5  bilaterally performed with a nail nipper.  Filed with dremel without incident.    Return office visit 3 months                  Told patient to return for periodic foot care and evaluation due to potential at risk complications.   Gardiner Barefoot DPM

## 2020-07-07 ENCOUNTER — Emergency Department
Admission: EM | Admit: 2020-07-07 | Discharge: 2020-07-08 | Disposition: A | Discharge status: Skilled Nursing Facility | Payer: Medicare Other | Attending: Emergency Medicine | Admitting: Emergency Medicine

## 2020-07-07 ENCOUNTER — Other Ambulatory Visit: Payer: Self-pay

## 2020-07-07 DIAGNOSIS — Z7984 Long term (current) use of oral hypoglycemic drugs: Secondary | ICD-10-CM | POA: Insufficient documentation

## 2020-07-07 DIAGNOSIS — E11649 Type 2 diabetes mellitus with hypoglycemia without coma: Secondary | ICD-10-CM | POA: Diagnosis not present

## 2020-07-07 DIAGNOSIS — Z87891 Personal history of nicotine dependence: Secondary | ICD-10-CM | POA: Diagnosis not present

## 2020-07-07 DIAGNOSIS — E1122 Type 2 diabetes mellitus with diabetic chronic kidney disease: Secondary | ICD-10-CM | POA: Insufficient documentation

## 2020-07-07 DIAGNOSIS — I130 Hypertensive heart and chronic kidney disease with heart failure and stage 1 through stage 4 chronic kidney disease, or unspecified chronic kidney disease: Secondary | ICD-10-CM | POA: Insufficient documentation

## 2020-07-07 DIAGNOSIS — E162 Hypoglycemia, unspecified: Secondary | ICD-10-CM

## 2020-07-07 DIAGNOSIS — N184 Chronic kidney disease, stage 4 (severe): Secondary | ICD-10-CM | POA: Insufficient documentation

## 2020-07-07 DIAGNOSIS — Z79899 Other long term (current) drug therapy: Secondary | ICD-10-CM | POA: Insufficient documentation

## 2020-07-07 DIAGNOSIS — Z7902 Long term (current) use of antithrombotics/antiplatelets: Secondary | ICD-10-CM | POA: Diagnosis not present

## 2020-07-07 DIAGNOSIS — Z8673 Personal history of transient ischemic attack (TIA), and cerebral infarction without residual deficits: Secondary | ICD-10-CM | POA: Diagnosis not present

## 2020-07-07 DIAGNOSIS — I5022 Chronic systolic (congestive) heart failure: Secondary | ICD-10-CM | POA: Diagnosis not present

## 2020-07-07 DIAGNOSIS — I2510 Atherosclerotic heart disease of native coronary artery without angina pectoris: Secondary | ICD-10-CM | POA: Diagnosis not present

## 2020-07-07 DIAGNOSIS — Z794 Long term (current) use of insulin: Secondary | ICD-10-CM | POA: Insufficient documentation

## 2020-07-07 LAB — CBC WITH DIFFERENTIAL/PLATELET
Abs Immature Granulocytes: 0.06 10*3/uL (ref 0.00–0.07)
Basophils Absolute: 0.1 10*3/uL (ref 0.0–0.1)
Basophils Relative: 1 %
Eosinophils Absolute: 0.5 10*3/uL (ref 0.0–0.5)
Eosinophils Relative: 5 %
HCT: 34.5 % — ABNORMAL LOW (ref 39.0–52.0)
Hemoglobin: 10.7 g/dL — ABNORMAL LOW (ref 13.0–17.0)
Immature Granulocytes: 1 %
Lymphocytes Relative: 20 %
Lymphs Abs: 2 10*3/uL (ref 0.7–4.0)
MCH: 26.2 pg (ref 26.0–34.0)
MCHC: 31 g/dL (ref 30.0–36.0)
MCV: 84.4 fL (ref 80.0–100.0)
Monocytes Absolute: 1 10*3/uL (ref 0.1–1.0)
Monocytes Relative: 10 %
Neutro Abs: 6.5 10*3/uL (ref 1.7–7.7)
Neutrophils Relative %: 63 %
Platelets: 133 10*3/uL — ABNORMAL LOW (ref 150–400)
RBC: 4.09 MIL/uL — ABNORMAL LOW (ref 4.22–5.81)
RDW: 15.6 % — ABNORMAL HIGH (ref 11.5–15.5)
WBC: 10.2 10*3/uL (ref 4.0–10.5)
nRBC: 0 % (ref 0.0–0.2)

## 2020-07-07 LAB — BASIC METABOLIC PANEL
Anion gap: 10 (ref 5–15)
BUN: 56 mg/dL — ABNORMAL HIGH (ref 8–23)
CO2: 25 mmol/L (ref 22–32)
Calcium: 9.3 mg/dL (ref 8.9–10.3)
Chloride: 106 mmol/L (ref 98–111)
Creatinine, Ser: 2.66 mg/dL — ABNORMAL HIGH (ref 0.61–1.24)
GFR, Estimated: 23 mL/min — ABNORMAL LOW (ref >60)
Glucose, Bld: 83 mg/dL (ref 70–99)
Potassium: 3.7 mmol/L (ref 3.5–5.1)
Sodium: 141 mmol/L (ref 135–145)

## 2020-07-07 LAB — URINALYSIS, COMPLETE (UACMP) WITH MICROSCOPIC
Bacteria, UA: NONE SEEN
Bilirubin Urine: NEGATIVE
Glucose, UA: NEGATIVE mg/dL
Hgb urine dipstick: NEGATIVE
Ketones, ur: NEGATIVE mg/dL
Leukocytes,Ua: NEGATIVE
Nitrite: NEGATIVE
Protein, ur: NEGATIVE mg/dL
Specific Gravity, Urine: 1.009 (ref 1.005–1.030)
Squamous Epithelial / HPF: NONE SEEN (ref 0–5)
WBC, UA: NONE SEEN WBC/hpf (ref 0–5)
pH: 5 (ref 5.0–8.0)

## 2020-07-07 LAB — CBG MONITORING, ED
Glucose-Capillary: 68 mg/dL — ABNORMAL LOW (ref 70–99)
Glucose-Capillary: 88 mg/dL (ref 70–99)
Glucose-Capillary: 141 mg/dL — ABNORMAL HIGH (ref 70–99)

## 2020-07-07 NOTE — ED Triage Notes (Signed)
Pt brought in by EMS from [facility]. Pt states he began feeling shaky around dinner time and called EMS. Per EMS, Pt's CBG was 55 on arrival. Pt ate a peanut butter sandwich and was given PO glucose. Pt's repeat CBG was 62. EMS then administered IM Glucagon.

## 2020-07-07 NOTE — Discharge Instructions (Addendum)
Please seek medical attention for any high fevers, chest pain, shortness of breath, change in behavior, persistent vomiting, bloody stool or any other new or concerning symptoms.  Follow the below slidding scale for your short acting insulin  CBG < 70: 0 units CBG 70 - 120: 0 units CBG 121 - 150: 3 units CBG 151 - 200:4 units CBG 201 - 250:7 units CBG 251 - 300:11 units CBG 301 - 350:15 units CBG 351 - 400:20 units CBG > 400: call your doctor or return to the ER

## 2020-07-07 NOTE — ED Provider Notes (Signed)
[facility] Emergency Department Provider Note   ____________________________________________   I have reviewed the triage vital signs and the nursing notes.   HISTORY  Chief Complaint Hypoglycemia   History limited by: Not Limited   HPI Ryan Mcclure is a 87 y.o. male who presents to the emergency department today from living facility because of concerns for low blood sugar.  Patient states that he started to feel shaky today.  He states that this is how he typically feels when his sugars went low.  They did check his sugars and found him to be low.  They did try to give him juice and when the sugars were not improving call 911.  At the time my exam the patient states he does feel somewhat better.  The patient denies any recent illness or fevers.  Records reviewed. Per medical record review patient has a history of CKD, DM, CVA.   Past Medical History:  Diagnosis Date   Chronic systolic CHF (congestive heart failure) (HCC)    CKD (chronic kidney disease), stage III (HCC)    Diabetes (HCC)    HBP (high blood pressure)    Heart attack ([place])    High cholesterol    Stroke (cerebrum) (HCC)    Swelling     Patient Active Problem List   Diagnosis Date Noted   CKD stage 4 due to type 2 diabetes mellitus ([place]) 12/25/2019   History of GI diverticular bleed 12/25/2019   History of CVA (cerebrovascular accident) 12/25/2019   History of aortic valve replacement with bioprosthetic valve 12/25/2019   Cerebrovascular accident (CVA) due to occlusion of left posterior cerebral artery ([place]) 11/20/2017   Hyperlipidemia due to type 2 diabetes mellitus ([place]) 09/26/2017   Ulcer 09/26/2017   Stroke ([place]) 08/15/2017   Carotid stenosis 01/31/2016   Hematochezia    Diverticulosis of large intestine without hemorrhage    Hemorrhoid    GIB (gastrointestinal bleeding) 10/18/2015   Acute blood loss anemia 10/18/2015   CKD (chronic kidney disease), stage III (HCC)     Chronic systolic CHF (congestive heart failure) ([place])    Stroke (cerebrum) ([place])    Cardiomyopathy, ischemic 10/12/2015   Shortness of breath 09/14/2015   Absolute anemia 06/10/2015   Cataract cortical, senile 06/10/2015   Controlled type 2 diabetes mellitus without complication ([place]) [id number]   Acid reflux 06/10/2015   Combined fat and carbohydrate induced hyperlipemia 06/10/2015   Cerebrovascular accident (CVA) ([place]) 06/10/2015   Non-pressure chronic ulcer of skin of other sites with unspecified severity ([place]) 06/10/2015   Benign essential HTN 08/25/2014   Hypertension associated with diabetes ([place]) 08/25/2014   Arteriosclerosis of coronary artery 09/09/2013   CAD (coronary artery disease) 09/09/2013   Microalbuminuria 09/08/2013   Diabetes mellitus ([place]) 09/08/2013    Past Surgical History:  Procedure Laterality Date   COLONOSCOPY N/A 10/21/2015   Procedure: COLONOSCOPY;  Surgeon: Milus Banister, MD;  Location: [facility] ENDOSCOPY;  Service: Endoscopy;  Laterality: N/A;   COLONOSCOPY WITH PROPOFOL N/A 12/28/2019   Procedure: COLONOSCOPY WITH PROPOFOL;  Surgeon: Lesly Rubenstein, MD;  Location: [facility] ENDOSCOPY;  Service: Endoscopy;  Laterality: N/A;   EXPLORATION POST OPERATIVE OPEN HEART     EYE SURGERY Bilateral     Prior to Admission medications   Medication Sig Start Date End Date Taking? Authorizing Provider  amLODipine (NORVASC) 5 MG tablet Take 5 mg by mouth 2 (two) times daily. 12/17/19   [provider]  atorvastatin (LIPITOR) 40 MG tablet Take  40 mg by mouth daily.    [provider]  clopidogrel (PLAVIX) 75 MG tablet Take 75 mg by mouth at bedtime.     [provider]  Cyanocobalamin 1000 MCG TBCR Take 1,000 mcg by mouth daily.     [provider]  ferrous sulfate 325 (65 FE) MG tablet Take 325 mg by mouth 3 (three) times daily with meals.     [provider]  fluticasone (FLONASE) 50 MCG/ACT nasal spray Place 1 spray into  both nostrils daily as needed for allergies.  06/03/13   [provider]  furosemide (LASIX) 20 MG tablet Take 40 mg by mouth 2 (two) times daily. 10/11/17   [provider]  insulin aspart (NOVOLOG) 100 UNIT/ML FlexPen Inject 4-14 Units into the skin See admin instructions. Inject under the skin according to sliding scale: Breakfast (4u <100 or 8u >100) Lunch (6u <100 or 12u >100) Supper (7u <100 or 14u >100)    [provider]  Insulin Glargine (BASAGLAR KWIKPEN) 100 UNIT/ML Inject 20 Units into the skin at bedtime.  11/02/19   [provider]  isosorbide mononitrate (IMDUR) 30 MG 24 hr tablet Take 30 mg by mouth daily. 12/10/19   [provider]  metFORMIN (GLUCOPHAGE) 500 MG tablet Take 500 mg by mouth 2 (two) times daily. 07/23/17   [provider]  metoprolol tartrate (LOPRESSOR) 50 MG tablet TAKE 1 TABLET BY MOUTH TWICE DAILY 11/18/17   [provider]  nitroGLYCERIN (NITROSTAT) 0.4 MG SL tablet take 1 tablet under the tongue every 5 minutes if needed for chest pain 10/29/14   [provider]  omeprazole (PRILOSEC) 40 MG capsule Take 40 mg by mouth 2 (two) times daily.     [provider]  telmisartan (MICARDIS) 80 MG tablet Take 1 tablet by mouth daily. 12/01/19   [provider]    Allergies Neuromuscular blocking agents, Other, and Shrimp [shellfish allergy]  Family History  Problem Relation Age of Onset   Diabetes Mellitus I Mother    Kidney failure Mother    Alcoholism Father    Heart attack Sister    Prostate cancer Brother     Social History Social History   Tobacco Use   Smoking status: Former    Pack years: 0.00    Types: Cigarettes    Quit date: 01/30/1995    Years since quitting: 25.4   Smokeless tobacco: Never  Vaping Use   Vaping Use: Never used  Substance Use Topics   Alcohol use: No   Drug use: No    Review of Systems Constitutional: No fever/chills Eyes: No visual  changes. ENT: No sore throat. Cardiovascular: Denies chest pain. Respiratory: Denies shortness of breath. Gastrointestinal: No abdominal pain.  No nausea, no vomiting.  No diarrhea.   Genitourinary: Negative for dysuria. Musculoskeletal: Negative for back pain. Skin: Negative for rash. Neurological: Negative for headaches, focal weakness or numbness.  ____________________________________________   PHYSICAL EXAM:  VITAL SIGNS: ED Triage Vitals  Enc Vitals Group     BP 07/07/20 2103 (!) 155/67     Pulse Rate 07/07/20 2103 74     Resp 07/07/20 2103 18     Temp 07/07/20 2103 98.2 F (36.8 C)     Temp Source 07/07/20 2103 Oral     SpO2 07/07/20 2103 97 %     Weight 07/07/20 2105 200 lb 6.4 oz (90.9 kg)     Height 07/07/20 2105 '5\' 6"'$  (1.676 m)  Head Circumference --      Peak Flow --      Pain Score 07/07/20 2104 0   Constitutional: Alert and oriented.  Eyes: Conjunctivae are normal.  ENT      Head: Normocephalic and atraumatic.      Nose: No congestion/rhinnorhea.      Mouth/Throat: Mucous membranes are moist.      Neck: No stridor. Hematological/Lymphatic/Immunilogical: No cervical lymphadenopathy. Cardiovascular: Normal rate, regular rhythm.  No murmurs, rubs, or gallops.  Respiratory: Normal respiratory effort without tachypnea nor retractions. Breath sounds are clear and equal bilaterally. No wheezes/rales/rhonchi. Gastrointestinal: Soft and non tender. No rebound. No guarding.  Genitourinary: Deferred Musculoskeletal: Normal range of motion in all extremities. No lower extremity edema. Neurologic:  Normal speech and language. No gross focal neurologic deficits are appreciated.  Skin:  Skin is warm, dry and intact. No rash noted. Psychiatric: Mood and affect are normal. Speech and behavior are normal. Patient exhibits appropriate insight and judgment.  ____________________________________________    LABS (pertinent positives/negatives)  CBC wbc 10.2, hgb 10.7,  plt 133 BMP na 141, k 3.7, glu 83, cr 2.66 CBG 68 to 88 to 141 ____________________________________________   EKG I, Nance Pear, attending physician, personally viewed and interpreted this EKG  EKG Time: 2100 Rate: 65 Rhythm: sinus rhythm with pvc Axis: left axis deviation Intervals: qtc 473 QRS: RBBB ST changes: no st elevation Impression: abnormal ekg  ____________________________________________    RADIOLOGY   None ____________________________________________   PROCEDURES  Procedures  ____________________________________________   INITIAL IMPRESSION / ASSESSMENT AND PLAN / ED COURSE  Pertinent labs & imaging results that were available during my care of the patient were reviewed by me and considered in my medical decision making (see chart for details).   Patient presented to the emergency department today because of concerns for hypoglycemia from living facility.  Patient denies any recent illness.  At the time my exam patient is feeling better.  Patient's blood work does show a slight elevation of his creatinine over what appears to be his baseline of around 2 to 2.3.  No evidence of any acute infection.  Will observe in the emergency department to make sure patient maintains good glucose level. If he does not have further episode of hypoglycemia I do think it would be reasonable to discharge home. ____________________________________________   FINAL CLINICAL IMPRESSION(S) / ED DIAGNOSES  Final diagnoses:  Hypoglycemia     Note: This dictation was prepared with Dragon dictation. Any transcriptional errors that result from this process are unintentional     Nance Pear, MD 07/07/20 2324

## 2020-07-07 NOTE — ED Notes (Signed)
This NT provided pericare after BM to pt and helped back to bed from toilet. BM noted to be black/dark green. Pt stated that he takes an iron pill.

## 2020-07-08 DIAGNOSIS — E11649 Type 2 diabetes mellitus with hypoglycemia without coma: Secondary | ICD-10-CM | POA: Diagnosis not present

## 2020-07-08 LAB — CBG MONITORING, ED: Glucose-Capillary: 206 mg/dL — ABNORMAL HIGH (ref 70–99)

## 2020-07-08 MED ORDER — INSULIN ASPART 100 UNIT/ML IJ SOLN: 0.0000 [IU] | Freq: Three times a day (TID) | INTRAMUSCULAR | Status: DC

## 2020-07-08 MED ORDER — INSULIN ASPART 100 UNIT/ML IJ SOLN: 0.0000 [IU] | Freq: Every day | INTRAMUSCULAR | Status: DC

## 2020-07-08 NOTE — ED Provider Notes (Signed)
CBG stable for 3 hours. Patient sliding scale was reviewed and it seems patient is instructed to administer 4 units of insulin at breakfast for sugars less than 100, 6 units at lunch for sugar less than 100, 7 units at dinner for sugar less than 100.  I have changed patient sliding scale so patient is not receiving short acting insulin for sugars less than 100.  Recommended close follow-up with PCP.  Patient stable for discharge.   Alfred Levins, [place], MD 07/08/20 [phone]

## 2020-07-08 NOTE — ED Notes (Signed)
Attempted to call report to [facility] X4 with no answer. Pt educated on new insulin sliding scale. Pt verbalizes understanding and states " When the nurse comes to see me in the morning I will show her the paperwork(Discharge Instructions)."

## 2020-08-08 ENCOUNTER — Ambulatory Visit: Payer: Medicare Other | Admitting: Podiatry

## 2020-08-18 ENCOUNTER — Ambulatory Visit (INDEPENDENT_AMBULATORY_CARE_PROVIDER_SITE_OTHER): Payer: Medicare Other | Admitting: Podiatry

## 2020-08-18 ENCOUNTER — Encounter: Payer: Self-pay | Admitting: Podiatry

## 2020-08-18 ENCOUNTER — Other Ambulatory Visit: Payer: Self-pay

## 2020-08-18 DIAGNOSIS — E1122 Type 2 diabetes mellitus with diabetic chronic kidney disease: Secondary | ICD-10-CM

## 2020-08-18 DIAGNOSIS — E1142 Type 2 diabetes mellitus with diabetic polyneuropathy: Secondary | ICD-10-CM

## 2020-08-18 DIAGNOSIS — M79609 Pain in unspecified limb: Secondary | ICD-10-CM

## 2020-08-18 DIAGNOSIS — D689 Coagulation defect, unspecified: Secondary | ICD-10-CM | POA: Diagnosis not present

## 2020-08-18 DIAGNOSIS — B351 Tinea unguium: Secondary | ICD-10-CM | POA: Diagnosis not present

## 2020-08-18 DIAGNOSIS — N184 Chronic kidney disease, stage 4 (severe): Secondary | ICD-10-CM

## 2020-08-18 NOTE — Progress Notes (Signed)
This patient returns to my office for at risk foot care.  This patient requires this care by a professional since this patient will be at risk due to having chronic kidney disease and diabetes.  This patient is unable to cut nails himself since the patient cannot reach his nails.These nails are painful walking and wearing shoes. He presents to the office with his daughter.   This patient presents for at risk foot care today.  General Appearance  Alert, conversant and in no acute stress.  Vascular  Dorsalis pedis and posterior tibial  pulses are weakly  palpable  bilaterally.  Capillary return is within normal limits  bilaterally. Temperature is within normal limits  Bilaterally. Absent digital hair.   Neurologic  Senn-Weinstein monofilament wire test within normal limits  bilaterally. Muscle power within normal limits bilaterally.  Nails Thick disfigured discolored nails with subungual debris  from hallux to fifth toes bilaterally. No evidence of bacterial infection or drainage bilaterally.  Orthopedic  No limitations of motion  feet .  No crepitus or effusions noted.  No bony pathology or digital deformities noted.  Skin  normotropic skin with no porokeratosis noted bilaterally.  No signs of infections or ulcers noted.     Onychomycosis  Pain in right toes  Pain in left toes  Consent was obtained for treatment procedures.   Mechanical debridement of nails 1-5  bilaterally performed with a nail nipper.  Filed with dremel without incident.    Return office visit 3 months                  Told patient to return for periodic foot care and evaluation due to potential at risk complications.   Gardiner Barefoot DPM

## 2020-09-28 ENCOUNTER — Other Ambulatory Visit: Payer: Self-pay

## 2020-09-28 ENCOUNTER — Ambulatory Visit (INDEPENDENT_AMBULATORY_CARE_PROVIDER_SITE_OTHER): Payer: Medicare Other

## 2020-09-28 DIAGNOSIS — E1142 Type 2 diabetes mellitus with diabetic polyneuropathy: Secondary | ICD-10-CM

## 2020-09-28 NOTE — Progress Notes (Signed)
Patient presents to the office today for diabetic shoe and insole measuring.  Patient was measured with brannock device to determine size and width for 1 pair of extra depth shoes and foam casted for 3 pair of insoles.   Documentation of medical necessity will be sent to patient's treating diabetic doctor to verify and sign.   Patient's diabetic provider: Dr. Janann Colonel and insoles will be ordered at that time and patient will be notified for an appointment for fitting when they arrive.   Shoe size (per patient): 9.5   Brannock measurement: 9.5 medium  Patient shoe selection-   1st choice:   [id number]  2nd choice:  [id number]  Shoe size ordered: 9.5 med

## 2020-10-08 ENCOUNTER — Emergency Department
Admission: EM | Admit: 2020-10-08 | Discharge: 2020-10-08 | Disposition: A | Discharge status: Home / Self Care | Payer: Medicare Other | Attending: Emergency Medicine | Admitting: Emergency Medicine

## 2020-10-08 ENCOUNTER — Other Ambulatory Visit: Payer: Self-pay

## 2020-10-08 DIAGNOSIS — Z87891 Personal history of nicotine dependence: Secondary | ICD-10-CM | POA: Insufficient documentation

## 2020-10-08 DIAGNOSIS — E1122 Type 2 diabetes mellitus with diabetic chronic kidney disease: Secondary | ICD-10-CM | POA: Diagnosis not present

## 2020-10-08 DIAGNOSIS — T383X1A Poisoning by insulin and oral hypoglycemic [antidiabetic] drugs, accidental (unintentional), initial encounter: Secondary | ICD-10-CM | POA: Diagnosis not present

## 2020-10-08 DIAGNOSIS — I2510 Atherosclerotic heart disease of native coronary artery without angina pectoris: Secondary | ICD-10-CM | POA: Diagnosis not present

## 2020-10-08 DIAGNOSIS — Z79899 Other long term (current) drug therapy: Secondary | ICD-10-CM | POA: Diagnosis not present

## 2020-10-08 DIAGNOSIS — Z7984 Long term (current) use of oral hypoglycemic drugs: Secondary | ICD-10-CM | POA: Diagnosis not present

## 2020-10-08 DIAGNOSIS — I130 Hypertensive heart and chronic kidney disease with heart failure and stage 1 through stage 4 chronic kidney disease, or unspecified chronic kidney disease: Secondary | ICD-10-CM | POA: Diagnosis not present

## 2020-10-08 DIAGNOSIS — Z7902 Long term (current) use of antithrombotics/antiplatelets: Secondary | ICD-10-CM | POA: Diagnosis not present

## 2020-10-08 DIAGNOSIS — I5022 Chronic systolic (congestive) heart failure: Secondary | ICD-10-CM | POA: Diagnosis not present

## 2020-10-08 DIAGNOSIS — Z794 Long term (current) use of insulin: Secondary | ICD-10-CM | POA: Diagnosis not present

## 2020-10-08 DIAGNOSIS — N184 Chronic kidney disease, stage 4 (severe): Secondary | ICD-10-CM | POA: Insufficient documentation

## 2020-10-08 LAB — CBG MONITORING, ED
Glucose-Capillary: 152 mg/dL — ABNORMAL HIGH (ref 70–99)
Glucose-Capillary: 136 mg/dL — ABNORMAL HIGH (ref 70–99)
Glucose-Capillary: 116 mg/dL — ABNORMAL HIGH (ref 70–99)
Glucose-Capillary: 120 mg/dL — ABNORMAL HIGH (ref 70–99)
Glucose-Capillary: 181 mg/dL — ABNORMAL HIGH (ref 70–99)

## 2020-10-08 NOTE — ED Notes (Signed)
Patient is resting comfortably, he is awake and alert OX3.

## 2020-10-08 NOTE — ED Provider Notes (Signed)
[facility] Emergency Department Provider Note   ____________________________________________   Event Date/Time   First MD Initiated Contact with Patient 10/08/20 1502     (approximate)  I have reviewed the triage vital signs and the nursing notes.   HISTORY  Chief Complaint Insulin Reaction (Per EMS patient from [facility] caregiver found patient thinks he may have taken his long acting insulin twice however needle was bent, sister wanted him evaluated)  HPI Ryan Mcclure is a 87 y.o. male with a stated past medical history of type 2 diabetes on insulin who presents after accidentally taking a double dose of his long-acting insulin.  Patient states that he took the second long-acting dose at approximately 9 AM this morning.  Patient denies any symptoms of hypoglycemia including decreased mental status, diaphoresis, chest pain, shortness of breath.  Patient states that he ate as he normally would for breakfast and lunch but was told to come to the emergency department for monitoring for hypoglycemia.          Past Medical History:  Diagnosis Date   Chronic systolic CHF (congestive heart failure) (HCC)    CKD (chronic kidney disease), stage III (HCC)    Diabetes (HCC)    HBP (high blood pressure)    Heart attack ([place])    High cholesterol    Stroke (cerebrum) (HCC)    Swelling     Patient Active Problem List   Diagnosis Date Noted   CKD stage 4 due to type 2 diabetes mellitus ([place]) 12/25/2019   History of GI diverticular bleed 12/25/2019   History of CVA (cerebrovascular accident) 12/25/2019   History of aortic valve replacement with bioprosthetic valve 12/25/2019   Cerebrovascular accident (CVA) due to occlusion of left posterior cerebral artery ([place]) 11/20/2017   Hyperlipidemia due to type 2 diabetes mellitus ([place]) 09/26/2017   Ulcer 09/26/2017   Stroke ([place]) 08/15/2017   Carotid stenosis 01/31/2016   Hematochezia    Diverticulosis of large  intestine without hemorrhage    Hemorrhoid    GIB (gastrointestinal bleeding) 10/18/2015   Acute blood loss anemia 10/18/2015   CKD (chronic kidney disease), stage III (HCC)    Chronic systolic CHF (congestive heart failure) ([place])    Stroke (cerebrum) ([place])    Cardiomyopathy, ischemic 10/12/2015   Shortness of breath 09/14/2015   Absolute anemia 06/10/2015   Cataract cortical, senile 06/10/2015   Controlled type 2 diabetes mellitus without complication ([place]) [id number]   Acid reflux 06/10/2015   Combined fat and carbohydrate induced hyperlipemia 06/10/2015   Cerebrovascular accident (CVA) ([place]) 06/10/2015   Non-pressure chronic ulcer of skin of other sites with unspecified severity ([place]) 06/10/2015   Benign essential HTN 08/25/2014   Hypertension associated with diabetes ([place]) 08/25/2014   Arteriosclerosis of coronary artery 09/09/2013   CAD (coronary artery disease) 09/09/2013   Microalbuminuria 09/08/2013   Diabetes mellitus ([place]) 09/08/2013    Past Surgical History:  Procedure Laterality Date   COLONOSCOPY N/A 10/21/2015   Procedure: COLONOSCOPY;  Surgeon: Milus Banister, MD;  Location: [facility];  Service: Endoscopy;  Laterality: N/A;   COLONOSCOPY WITH PROPOFOL N/A 12/28/2019   Procedure: COLONOSCOPY WITH PROPOFOL;  Surgeon: Lesly Rubenstein, MD;  Location: [facility] ENDOSCOPY;  Service: Endoscopy;  Laterality: N/A;   EXPLORATION POST OPERATIVE OPEN HEART     EYE SURGERY Bilateral     Prior to Admission medications   Medication Sig Start Date End Date Taking? Authorizing Provider  amLODipine (NORVASC) 5 MG tablet Take  5 mg by mouth 2 (two) times daily. 12/17/19   [provider]  atorvastatin (LIPITOR) 40 MG tablet Take 40 mg by mouth daily.    [provider]  clopidogrel (PLAVIX) 75 MG tablet Take 75 mg by mouth at bedtime.     [provider]  Cyanocobalamin 1000 MCG TBCR Take 1,000 mcg by mouth daily.     [provider]  ferrous  sulfate 325 (65 FE) MG tablet Take 325 mg by mouth 3 (three) times daily with meals.     [provider]  fluticasone (FLONASE) 50 MCG/ACT nasal spray Place 1 spray into both nostrils daily as needed for allergies.  06/03/13   [provider]  furosemide (LASIX) 20 MG tablet Take 40 mg by mouth 2 (two) times daily. 10/11/17   [provider]  insulin aspart (NOVOLOG) 100 UNIT/ML FlexPen Inject 4-14 Units into the skin See admin instructions. Inject under the skin according to sliding scale: Breakfast (4u <100 or 8u >100) Lunch (6u <100 or 12u >100) Supper (7u <100 or 14u >100)    [provider]  Insulin Glargine (BASAGLAR KWIKPEN) 100 UNIT/ML Inject 20 Units into the skin at bedtime.  11/02/19   [provider]  isosorbide mononitrate (IMDUR) 30 MG 24 hr tablet Take 30 mg by mouth daily. 12/10/19   [provider]  metFORMIN (GLUCOPHAGE) 500 MG tablet Take 500 mg by mouth 2 (two) times daily. 07/23/17   [provider]  metoprolol tartrate (LOPRESSOR) 50 MG tablet TAKE 1 TABLET BY MOUTH TWICE DAILY 11/18/17   [provider]  nitroGLYCERIN (NITROSTAT) 0.4 MG SL tablet take 1 tablet under the tongue every 5 minutes if needed for chest pain 10/29/14   [provider]  omeprazole (PRILOSEC) 40 MG capsule Take 40 mg by mouth 2 (two) times daily.     [provider]  telmisartan (MICARDIS) 80 MG tablet Take 1 tablet by mouth daily. 12/01/19   [provider]    Allergies Neuromuscular blocking agents, Other, and Shrimp [shellfish allergy]  Family History  Problem Relation Age of Onset   Diabetes Mellitus I Mother    Kidney failure Mother    Alcoholism Father    Heart attack Sister    Prostate cancer Brother     Social History Social History   Tobacco Use   Smoking status: Former    Types: Cigarettes    Quit date: 01/30/1995    Years since quitting: 25.7   Smokeless tobacco: Never  Vaping Use    Vaping Use: Never used  Substance Use Topics   Alcohol use: No   Drug use: No    Review of Systems Constitutional: No fever/chills Eyes: No visual changes. ENT: No sore throat. Cardiovascular: Denies chest pain. Respiratory: Denies shortness of breath. Gastrointestinal: No abdominal pain.  No nausea, no vomiting.  No diarrhea. Genitourinary: Negative for dysuria. Musculoskeletal: Negative for acute arthralgias Skin: Negative for rash. Neurological: Negative for headaches, weakness/numbness/paresthesias in any extremity Psychiatric: Negative for suicidal ideation/homicidal ideation   ____________________________________________   PHYSICAL EXAM:  VITAL SIGNS: ED Triage Vitals  Enc Vitals Group     BP 10/08/20 1505 (!) 163/65     Pulse Rate 10/08/20 1505 74     Resp 10/08/20 1505 16     Temp 10/08/20 1505 98.4 F (36.9 C)     Temp Source 10/08/20 1505 Oral     SpO2 10/08/20 1505 97 %     Weight 10/08/20 1507  212 lb 8.4 oz (96.4 kg)     Height --      Head Circumference --      Peak Flow --      Pain Score 10/08/20 1506 0     Pain Loc --      Pain Edu? --      Excl. in [place]? --    Constitutional: Alert and oriented. Well appearing and in no acute distress. Eyes: Conjunctivae are normal. PERRL. Head: Atraumatic. Nose: No congestion/rhinnorhea. Mouth/Throat: Mucous membranes are moist. Neck: No stridor Cardiovascular: Grossly normal heart sounds.  Good peripheral circulation. Respiratory: Normal respiratory effort.  No retractions. Gastrointestinal: Soft and nontender. No distention. Musculoskeletal: No obvious deformities Neurologic:  Normal speech and language. No gross focal neurologic deficits are appreciated. Skin:  Skin is warm and dry. No rash noted. Psychiatric: Mood and affect are normal. Speech and behavior are normal.  ____________________________________________   LABS (all labs ordered are listed, but only abnormal results are displayed)  Labs  Reviewed  CBG MONITORING, ED - Abnormal; Notable for the following components:      Result Value   Glucose-Capillary 152 (*)    All other components within normal limits  CBG MONITORING, ED - Abnormal; Notable for the following components:   Glucose-Capillary 136 (*)    All other components within normal limits  CBG MONITORING, ED - Abnormal; Notable for the following components:   Glucose-Capillary 116 (*)    All other components within normal limits  CBG MONITORING, ED - Abnormal; Notable for the following components:   Glucose-Capillary 120 (*)    All other components within normal limits  CBG MONITORING, ED - Abnormal; Notable for the following components:   Glucose-Capillary 181 (*)    All other components within normal limits   PROCEDURES  Procedure(s) performed (including Critical Care):  .1-3 Lead EKG Interpretation Performed by: Naaman Plummer, MD Authorized by: Naaman Plummer, MD     Interpretation: normal     ECG rate:  76   ECG rate assessment: normal     Rhythm: sinus rhythm     Ectopy: none     Conduction: normal     ____________________________________________   INITIAL IMPRESSION / ASSESSMENT AND PLAN / ED COURSE  As part of my medical decision making, I reviewed the following data within the electronic medical record, if available:  Nursing notes reviewed and incorporated, Labs reviewed, EKG interpreted, Old chart reviewed, Radiograph reviewed and Notes from prior ED visits reviewed and incorporated     Insulin Dependent Diabetic presents BIBA for unintentional overdose of his long-acting insulin  Given History, Exam, and Workup I have a low suspicion for sepsis induced hypoglycemia, suicidal ideation/purposeful overdose attempt.  Reassessment: 2155 After observation in the emergency department for several hours, the Blood sugar has remained stable, and based on the history is expected that the Blood sugar should remain stable after  discharge. Disposition: Plan discharge home with close primary care follow-up      ____________________________________________   FINAL CLINICAL IMPRESSION(S) / ED DIAGNOSES  Final diagnoses:  Insulin overdose, accidental or unintentional, initial encounter     ED Discharge Orders     None        Note:  This document was prepared using Dragon voice recognition software and may include unintentional dictation errors.    Naaman Plummer, MD 10/08/20 2325

## 2020-10-08 NOTE — ED Notes (Signed)
Patient given sandwich tray. 

## 2020-10-08 NOTE — ED Notes (Signed)
Pts Sister Stanton Kidney) updated.

## 2020-10-08 NOTE — ED Notes (Signed)
Orange Juice given per verbal order from provider.

## 2020-10-08 NOTE — ED Notes (Addendum)
Pts facility to arrange pts ride back to his facility.  E-signature pad unavailable - Pt verbalized understanding of D/C information - no additional concerns at this time.

## 2020-10-08 NOTE — ED Notes (Signed)
Pt provided orange juice.

## 2020-10-08 NOTE — ED Triage Notes (Signed)
Patient caregiver thinks patient may have taken his long acting insulin twice instead of his long acting and short acting, patient is awake and alert Ox3.

## 2020-11-23 ENCOUNTER — Telehealth: Payer: Self-pay | Admitting: Internal Medicine

## 2020-11-23 NOTE — Telephone Encounter (Signed)
Ryan Mcclure with  [name] called stating that she needs pt Workers Comp lab work. If you would please fax it too [phone]

## 2020-11-23 NOTE — Telephone Encounter (Signed)
Is this the wrong pt? This is not a [name] pt.

## 2020-11-24 NOTE — Telephone Encounter (Signed)
Spoke to [place]. Advised her he is not our pt. She was stating he had a needle stick that Dr [name] ordered labs on. She reached out to the lab and got it.

## 2020-11-24 NOTE — Telephone Encounter (Signed)
Yes I told Ryan Mcclure that he was not a pt of [name],she said she knew but because [name] works with [facility].

## 2020-12-19 ENCOUNTER — Other Ambulatory Visit: Payer: Self-pay

## 2020-12-19 ENCOUNTER — Ambulatory Visit (INDEPENDENT_AMBULATORY_CARE_PROVIDER_SITE_OTHER): Payer: Medicare Other | Admitting: Podiatry

## 2020-12-19 ENCOUNTER — Encounter: Payer: Self-pay | Admitting: Podiatry

## 2020-12-19 DIAGNOSIS — B351 Tinea unguium: Secondary | ICD-10-CM | POA: Diagnosis not present

## 2020-12-19 DIAGNOSIS — M79609 Pain in unspecified limb: Secondary | ICD-10-CM

## 2020-12-19 DIAGNOSIS — D689 Coagulation defect, unspecified: Secondary | ICD-10-CM

## 2020-12-19 DIAGNOSIS — E1122 Type 2 diabetes mellitus with diabetic chronic kidney disease: Secondary | ICD-10-CM | POA: Diagnosis not present

## 2020-12-19 DIAGNOSIS — N184 Chronic kidney disease, stage 4 (severe): Secondary | ICD-10-CM

## 2020-12-19 DIAGNOSIS — E1142 Type 2 diabetes mellitus with diabetic polyneuropathy: Secondary | ICD-10-CM

## 2020-12-19 NOTE — Progress Notes (Addendum)
This patient returns to my office for at risk foot care.  This patient requires this care by a professional since this patient will be at risk due to having chronic kidney disease and diabetes.  This patient is unable to cut nails himself since the patient cannot reach his nails.These nails are painful walking and wearing shoes. He presents to the office with his daughter.   This patient presents for at risk foot care today.  General Appearance  Alert, conversant and in no acute stress.  Vascular  Dorsalis pedis and posterior tibial  pulses are weakly  palpable  bilaterally.  Capillary return is within normal limits  bilaterally. Temperature is within normal limits  Bilaterally. Absent digital hair.   Neurologic  Senn-Weinstein monofilament wire test within normal limits  bilaterally. Muscle power within normal limits bilaterally.  Nails Thick disfigured discolored nails with subungual debris  from hallux to fifth toes bilaterally. No evidence of bacterial infection or drainage bilaterally.  Orthopedic  No limitations of motion  feet .  No crepitus or effusions noted.  No bony pathology or digital deformities noted.  Skin  normotropic skin with no porokeratosis noted bilaterally.  No signs of infections or ulcers noted.     Onychomycosis  Pain in right toes  Pain in left toes  Consent was obtained for treatment procedures.   Mechanical debridement of nails 1-5  bilaterally performed with a nail nipper.  Filed with dremel without incident.  Diabetic shoes were to be dispensed but they were to small.  They will be sent back for wider pair.   Return office visit 3 months                  Told patient to return for periodic foot care and evaluation due to potential at risk complications.   Gardiner Barefoot DPM

## 2021-01-12 ENCOUNTER — Telehealth: Payer: Self-pay | Admitting: Podiatry

## 2021-01-12 NOTE — Telephone Encounter (Signed)
Called and spoke to pts sister to see if we could get pt in this year in [place] to pick up his shoes so the authorization does not expire. She is checking with his aide and will call me back.

## 2021-01-13 ENCOUNTER — Ambulatory Visit: Payer: Medicare Other

## 2021-01-13 ENCOUNTER — Other Ambulatory Visit: Payer: Self-pay

## 2021-01-13 DIAGNOSIS — I739 Peripheral vascular disease, unspecified: Secondary | ICD-10-CM | POA: Diagnosis not present

## 2021-01-13 DIAGNOSIS — E1142 Type 2 diabetes mellitus with diabetic polyneuropathy: Secondary | ICD-10-CM

## 2021-01-13 NOTE — Progress Notes (Signed)
SITUATION Reason for Visit: Fitting of Diabetic Shoes & Insoles Patient / Caregiver Report:  Patient reports comfort in shoes.  OBJECTIVE DATA: Patient History / Diagnosis:     ICD-10-CM   1. Diabetic polyneuropathy associated with type 2 diabetes mellitus (HCC)  E11.42       Change in Status:   None  ACTIONS PERFORMED: In-Person Delivery, patient was fit with: - 1x pair [id number] PDAC approved prefabricated Diabetic Shoes: B3000M 9.5XW - 3x pair [id number] PDAC approved CAM milled custom diabetic insoles  Shoes and insoles were verified for structural integrity and safety. Patient wore shoes and insoles in office. Skin was inspected and free of areas of concern after wearing shoes and inserts. Shoes and inserts fit properly. Patient / Caregiver provided with ferbal instruction and demonstration regarding donning, doffing, wear, care, proper fit, function, purpose, cleaning, and use of shoes and insoles ' and in all related precautions and risks and benefits regarding shoes and insoles. Patient / Caregiver was instructed to wear properly fitting socks with shoes at all times. Patient was also provided with verbal instruction regarding how to report any failures or malfunctions of shoes or inserts, and necessary follow up care. Patient / Caregiver was also instructed to contact physician regarding change in status that may affect function of shoes and inserts.   Patient / Caregiver verbalized undersatnding of instruction provided. Patient / Caregiver demonstrated independence with proper donning and doffing of shoes and inserts.  PLAN Patient to follow up as needed. Plan of care was discussed with and agreed upon by patient and/or caregiver. All questions were answered and concerns addressed.

## 2021-01-17 ENCOUNTER — Encounter: Payer: Self-pay | Admitting: Emergency Medicine

## 2021-01-17 ENCOUNTER — Emergency Department: Payer: Medicare Other

## 2021-01-17 ENCOUNTER — Other Ambulatory Visit: Payer: Self-pay

## 2021-01-17 ENCOUNTER — Inpatient Hospital Stay
Admission: EM | Admit: 2021-01-17 | Discharge: 2021-01-29 | DRG: 871 | Disposition: E | Payer: Medicare Other | Attending: Family Medicine | Admitting: Family Medicine

## 2021-01-17 DIAGNOSIS — I2510 Atherosclerotic heart disease of native coronary artery without angina pectoris: Secondary | ICD-10-CM | POA: Diagnosis present

## 2021-01-17 DIAGNOSIS — J9601 Acute respiratory failure with hypoxia: Secondary | ICD-10-CM | POA: Diagnosis present

## 2021-01-17 DIAGNOSIS — E1122 Type 2 diabetes mellitus with diabetic chronic kidney disease: Secondary | ICD-10-CM | POA: Diagnosis present

## 2021-01-17 DIAGNOSIS — K219 Gastro-esophageal reflux disease without esophagitis: Secondary | ICD-10-CM | POA: Diagnosis present

## 2021-01-17 DIAGNOSIS — A419 Sepsis, unspecified organism: Principal | ICD-10-CM | POA: Diagnosis present

## 2021-01-17 DIAGNOSIS — R809 Proteinuria, unspecified: Secondary | ICD-10-CM | POA: Diagnosis present

## 2021-01-17 DIAGNOSIS — R079 Chest pain, unspecified: Secondary | ICD-10-CM | POA: Diagnosis not present

## 2021-01-17 DIAGNOSIS — I4891 Unspecified atrial fibrillation: Secondary | ICD-10-CM | POA: Diagnosis not present

## 2021-01-17 DIAGNOSIS — R6520 Severe sepsis without septic shock: Secondary | ICD-10-CM | POA: Diagnosis present

## 2021-01-17 DIAGNOSIS — Z86010 Personal history of colonic polyps: Secondary | ICD-10-CM

## 2021-01-17 DIAGNOSIS — Z7902 Long term (current) use of antithrombotics/antiplatelets: Secondary | ICD-10-CM

## 2021-01-17 DIAGNOSIS — E7800 Pure hypercholesterolemia, unspecified: Secondary | ICD-10-CM | POA: Diagnosis present

## 2021-01-17 DIAGNOSIS — Z20822 Contact with and (suspected) exposure to covid-19: Secondary | ICD-10-CM | POA: Diagnosis present

## 2021-01-17 DIAGNOSIS — Z79899 Other long term (current) drug therapy: Secondary | ICD-10-CM

## 2021-01-17 DIAGNOSIS — J439 Emphysema, unspecified: Secondary | ICD-10-CM | POA: Diagnosis present

## 2021-01-17 DIAGNOSIS — I5023 Acute on chronic systolic (congestive) heart failure: Secondary | ICD-10-CM | POA: Diagnosis present

## 2021-01-17 DIAGNOSIS — R0902 Hypoxemia: Secondary | ICD-10-CM

## 2021-01-17 DIAGNOSIS — J810 Acute pulmonary edema: Secondary | ICD-10-CM | POA: Diagnosis not present

## 2021-01-17 DIAGNOSIS — I700 Atherosclerosis of aorta: Secondary | ICD-10-CM | POA: Diagnosis present

## 2021-01-17 DIAGNOSIS — Z6832 Body mass index (BMI) 32.0-32.9, adult: Secondary | ICD-10-CM

## 2021-01-17 DIAGNOSIS — I452 Bifascicular block: Secondary | ICD-10-CM | POA: Diagnosis present

## 2021-01-17 DIAGNOSIS — I248 Other forms of acute ischemic heart disease: Secondary | ICD-10-CM | POA: Diagnosis present

## 2021-01-17 DIAGNOSIS — I255 Ischemic cardiomyopathy: Secondary | ICD-10-CM | POA: Diagnosis present

## 2021-01-17 DIAGNOSIS — Z91013 Allergy to seafood: Secondary | ICD-10-CM

## 2021-01-17 DIAGNOSIS — I10 Essential (primary) hypertension: Secondary | ICD-10-CM | POA: Diagnosis not present

## 2021-01-17 DIAGNOSIS — Z953 Presence of xenogenic heart valve: Secondary | ICD-10-CM | POA: Diagnosis not present

## 2021-01-17 DIAGNOSIS — N179 Acute kidney failure, unspecified: Secondary | ICD-10-CM | POA: Diagnosis not present

## 2021-01-17 DIAGNOSIS — Z955 Presence of coronary angioplasty implant and graft: Secondary | ICD-10-CM

## 2021-01-17 DIAGNOSIS — J9602 Acute respiratory failure with hypercapnia: Secondary | ICD-10-CM | POA: Diagnosis not present

## 2021-01-17 DIAGNOSIS — Z8249 Family history of ischemic heart disease and other diseases of the circulatory system: Secondary | ICD-10-CM

## 2021-01-17 DIAGNOSIS — Z951 Presence of aortocoronary bypass graft: Secondary | ICD-10-CM

## 2021-01-17 DIAGNOSIS — N184 Chronic kidney disease, stage 4 (severe): Secondary | ICD-10-CM | POA: Diagnosis present

## 2021-01-17 DIAGNOSIS — I959 Hypotension, unspecified: Secondary | ICD-10-CM | POA: Diagnosis not present

## 2021-01-17 DIAGNOSIS — R911 Solitary pulmonary nodule: Secondary | ICD-10-CM | POA: Diagnosis present

## 2021-01-17 DIAGNOSIS — E669 Obesity, unspecified: Secondary | ICD-10-CM | POA: Diagnosis present

## 2021-01-17 DIAGNOSIS — I252 Old myocardial infarction: Secondary | ICD-10-CM

## 2021-01-17 DIAGNOSIS — Z515 Encounter for palliative care: Secondary | ICD-10-CM

## 2021-01-17 DIAGNOSIS — R778 Other specified abnormalities of plasma proteins: Secondary | ICD-10-CM | POA: Diagnosis not present

## 2021-01-17 DIAGNOSIS — M5416 Radiculopathy, lumbar region: Secondary | ICD-10-CM | POA: Diagnosis present

## 2021-01-17 DIAGNOSIS — I472 Ventricular tachycardia, unspecified: Secondary | ICD-10-CM | POA: Diagnosis present

## 2021-01-17 DIAGNOSIS — M545 Low back pain, unspecified: Secondary | ICD-10-CM | POA: Diagnosis not present

## 2021-01-17 DIAGNOSIS — I130 Hypertensive heart and chronic kidney disease with heart failure and stage 1 through stage 4 chronic kidney disease, or unspecified chronic kidney disease: Secondary | ICD-10-CM | POA: Diagnosis present

## 2021-01-17 DIAGNOSIS — I5021 Acute systolic (congestive) heart failure: Secondary | ICD-10-CM | POA: Diagnosis present

## 2021-01-17 DIAGNOSIS — Z841 Family history of disorders of kidney and ureter: Secondary | ICD-10-CM

## 2021-01-17 DIAGNOSIS — Z811 Family history of alcohol abuse and dependence: Secondary | ICD-10-CM

## 2021-01-17 DIAGNOSIS — I25118 Atherosclerotic heart disease of native coronary artery with other forms of angina pectoris: Secondary | ICD-10-CM | POA: Diagnosis not present

## 2021-01-17 DIAGNOSIS — Z888 Allergy status to other drugs, medicaments and biological substances status: Secondary | ICD-10-CM

## 2021-01-17 DIAGNOSIS — I471 Supraventricular tachycardia: Secondary | ICD-10-CM | POA: Diagnosis not present

## 2021-01-17 DIAGNOSIS — I509 Heart failure, unspecified: Secondary | ICD-10-CM

## 2021-01-17 DIAGNOSIS — I50811 Acute right heart failure: Secondary | ICD-10-CM | POA: Diagnosis not present

## 2021-01-17 DIAGNOSIS — Z87891 Personal history of nicotine dependence: Secondary | ICD-10-CM

## 2021-01-17 DIAGNOSIS — Z66 Do not resuscitate: Secondary | ICD-10-CM | POA: Diagnosis not present

## 2021-01-17 DIAGNOSIS — Z8042 Family history of malignant neoplasm of prostate: Secondary | ICD-10-CM

## 2021-01-17 DIAGNOSIS — D631 Anemia in chronic kidney disease: Secondary | ICD-10-CM | POA: Diagnosis present

## 2021-01-17 DIAGNOSIS — E1165 Type 2 diabetes mellitus with hyperglycemia: Secondary | ICD-10-CM | POA: Diagnosis not present

## 2021-01-17 DIAGNOSIS — E1151 Type 2 diabetes mellitus with diabetic peripheral angiopathy without gangrene: Secondary | ICD-10-CM | POA: Diagnosis present

## 2021-01-17 DIAGNOSIS — J189 Pneumonia, unspecified organism: Secondary | ICD-10-CM | POA: Diagnosis present

## 2021-01-17 DIAGNOSIS — Z833 Family history of diabetes mellitus: Secondary | ICD-10-CM

## 2021-01-17 DIAGNOSIS — Z794 Long term (current) use of insulin: Secondary | ICD-10-CM

## 2021-01-17 DIAGNOSIS — I495 Sick sinus syndrome: Secondary | ICD-10-CM | POA: Diagnosis present

## 2021-01-17 DIAGNOSIS — I5022 Chronic systolic (congestive) heart failure: Secondary | ICD-10-CM | POA: Diagnosis not present

## 2021-01-17 DIAGNOSIS — J9811 Atelectasis: Secondary | ICD-10-CM | POA: Diagnosis present

## 2021-01-17 DIAGNOSIS — Z7984 Long term (current) use of oral hypoglycemic drugs: Secondary | ICD-10-CM

## 2021-01-17 DIAGNOSIS — Z8673 Personal history of transient ischemic attack (TIA), and cerebral infarction without residual deficits: Secondary | ICD-10-CM

## 2021-01-17 LAB — CBC WITH DIFFERENTIAL/PLATELET
Abs Immature Granulocytes: 0.07 10*3/uL (ref 0.00–0.07)
Basophils Absolute: 0.1 10*3/uL (ref 0.0–0.1)
Basophils Relative: 0 %
Eosinophils Absolute: 0.2 10*3/uL (ref 0.0–0.5)
Eosinophils Relative: 2 %
HCT: 33.6 % — ABNORMAL LOW (ref 39.0–52.0)
Hemoglobin: 10.6 g/dL — ABNORMAL LOW (ref 13.0–17.0)
Immature Granulocytes: 1 %
Lymphocytes Relative: 11 %
Lymphs Abs: 1.5 10*3/uL (ref 0.7–4.0)
MCH: 26.4 pg (ref 26.0–34.0)
MCHC: 31.5 g/dL (ref 30.0–36.0)
MCV: 83.8 fL (ref 80.0–100.0)
Monocytes Absolute: 1.4 10*3/uL — ABNORMAL HIGH (ref 0.1–1.0)
Monocytes Relative: 10 %
Neutro Abs: 11.1 10*3/uL — ABNORMAL HIGH (ref 1.7–7.7)
Neutrophils Relative %: 76 %
Platelets: 139 10*3/uL — ABNORMAL LOW (ref 150–400)
RBC: 4.01 MIL/uL — ABNORMAL LOW (ref 4.22–5.81)
RDW: 14.5 % (ref 11.5–15.5)
WBC: 14.3 10*3/uL — ABNORMAL HIGH (ref 4.0–10.5)
nRBC: 0.1 % (ref 0.0–0.2)

## 2021-01-17 LAB — LACTIC ACID, PLASMA: Lactic Acid, Venous: 1.5 mmol/L (ref 0.5–1.9)

## 2021-01-17 LAB — URINALYSIS, COMPLETE (UACMP) WITH MICROSCOPIC
Bilirubin Urine: NEGATIVE
Glucose, UA: NEGATIVE mg/dL
Hgb urine dipstick: NEGATIVE
Ketones, ur: NEGATIVE mg/dL
Leukocytes,Ua: NEGATIVE
Nitrite: NEGATIVE
Protein, ur: 100 mg/dL — AB
Specific Gravity, Urine: 1.015 (ref 1.005–1.030)
Squamous Epithelial / HPF: NONE SEEN (ref 0–5)
pH: 5 (ref 5.0–8.0)

## 2021-01-17 LAB — COMPREHENSIVE METABOLIC PANEL
ALT: 12 U/L (ref 0–44)
AST: 17 U/L (ref 15–41)
Albumin: 3.5 g/dL (ref 3.5–5.0)
Alkaline Phosphatase: 77 U/L (ref 38–126)
Anion gap: 9 (ref 5–15)
BUN: 43 mg/dL — ABNORMAL HIGH (ref 8–23)
CO2: 24 mmol/L (ref 22–32)
Calcium: 9.4 mg/dL (ref 8.9–10.3)
Chloride: 105 mmol/L (ref 98–111)
Creatinine, Ser: 2.62 mg/dL — ABNORMAL HIGH (ref 0.61–1.24)
GFR, Estimated: 23 mL/min — ABNORMAL LOW (ref >60)
Glucose, Bld: 134 mg/dL — ABNORMAL HIGH (ref 70–99)
Potassium: 3.5 mmol/L (ref 3.5–5.1)
Sodium: 138 mmol/L (ref 135–145)
Total Bilirubin: 0.9 mg/dL (ref 0.3–1.2)
Total Protein: 8.2 g/dL — ABNORMAL HIGH (ref 6.5–8.1)

## 2021-01-17 LAB — TROPONIN I (HIGH SENSITIVITY): Troponin I (High Sensitivity): 45 ng/L — ABNORMAL HIGH (ref <18)

## 2021-01-17 LAB — BRAIN NATRIURETIC PEPTIDE: B Natriuretic Peptide: 1351.3 pg/mL — ABNORMAL HIGH (ref 0.0–100.0)

## 2021-01-17 LAB — PROTIME-INR
INR: 1 (ref 0.8–1.2)
Prothrombin Time: 13.6 seconds (ref 11.4–15.2)

## 2021-01-17 LAB — RESP PANEL BY RT-PCR (FLU A&B, COVID) ARPGX2
Influenza A by PCR: NEGATIVE
Influenza B by PCR: NEGATIVE
SARS Coronavirus 2 by RT PCR: NEGATIVE

## 2021-01-17 LAB — APTT: aPTT: 39 seconds — ABNORMAL HIGH (ref 24–36)

## 2021-01-17 MED ORDER — ACETAMINOPHEN 500 MG PO TABS
1000.0000 mg | ORAL_TABLET | Freq: Once | ORAL | Status: AC
  Administered 2021-01-17: 21:00:00 1000 mg via ORAL
  Filled 2021-01-17: qty 2

## 2021-01-17 MED ORDER — FUROSEMIDE 10 MG/ML IJ SOLN
40.0000 mg | Freq: Once | INTRAMUSCULAR | Status: AC
  Administered 2021-01-17: 23:00:00 40 mg via INTRAVENOUS
  Filled 2021-01-17: qty 4

## 2021-01-17 MED ORDER — SODIUM CHLORIDE 0.9 % IV SOLN
500.0000 mg | Freq: Once | INTRAVENOUS | Status: AC
  Administered 2021-01-17: 23:00:00 500 mg via INTRAVENOUS
  Filled 2021-01-17: qty 5

## 2021-01-17 MED ORDER — SODIUM CHLORIDE 0.9 % IV SOLN
1.0000 g | Freq: Once | INTRAVENOUS | Status: AC
  Administered 2021-01-17: 23:00:00 1 g via INTRAVENOUS
  Filled 2021-01-17: qty 10

## 2021-01-17 NOTE — ED Notes (Signed)
Informed RN [place] that pt has a bed assignment

## 2021-01-17 NOTE — ED Provider Notes (Signed)
[facility] Emergency Department Provider Note  ____________________________________________   Event Date/Time   First MD Initiated Contact with Patient 01/06/2021 2059     (approximate)  I have reviewed the triage vital signs and the nursing notes.   HISTORY  Chief Complaint Back Pain   HPI Ryan Mcclure is a 87 y.o. male with below noted past medical history who presents for assessment of some low back pain over the last 3 weeks that seems to come and go.  He states he has also had a slight cough over the last couple days although denies any significant shortness of breath, chest pain, fevers prior to arrival today, upper back pain, headache, earache, sore throat, nausea, vomiting, diarrhea, rash, burning with urination or other acute complaints.  No prior similar episodes with the back pain.  He is not on steroids at the moment and denies illicit drug use.  Denies any injuries falls or twisting or heavy lifting prior to symptom onset.  No other acute concerns at this time.         Past Medical History:  Diagnosis Date   Chronic systolic CHF (congestive heart failure) (HCC)    CKD (chronic kidney disease), stage III (HCC)    Diabetes (HCC)    HBP (high blood pressure)    Heart attack (HCC)    High cholesterol    Stroke (cerebrum) (HCC)    Swelling     Patient Active Problem List   Diagnosis Date Noted   Acute CHF (congestive heart failure) ([place]) 01/08/2021   CKD stage 4 due to type 2 diabetes mellitus ([place]) 12/25/2019   History of GI diverticular bleed 12/25/2019   History of CVA (cerebrovascular accident) 12/25/2019   History of aortic valve replacement with bioprosthetic valve 12/25/2019   Cerebrovascular accident (CVA) due to occlusion of left posterior cerebral artery ([place]) 11/20/2017   Hyperlipidemia due to type 2 diabetes mellitus ([place]) 09/26/2017   Ulcer 09/26/2017   Stroke ([place]) 08/15/2017   Carotid stenosis 01/31/2016   Hematochezia     Diverticulosis of large intestine without hemorrhage    Hemorrhoid    GIB (gastrointestinal bleeding) 10/18/2015   Acute blood loss anemia 10/18/2015   CKD (chronic kidney disease), stage III (HCC)    Chronic systolic CHF (congestive heart failure) ([place])    Stroke (cerebrum) ([place])    Cardiomyopathy, ischemic 10/12/2015   Shortness of breath 09/14/2015   Absolute anemia 06/10/2015   Cataract cortical, senile 06/10/2015   Controlled type 2 diabetes mellitus without complication ([place]) 16/10/9602   Acid reflux 06/10/2015   Combined fat and carbohydrate induced hyperlipemia 06/10/2015   Cerebrovascular accident (CVA) ([place]) 06/10/2015   Non-pressure chronic ulcer of skin of other sites with unspecified severity ([place]) 06/10/2015   Benign essential HTN 08/25/2014   Hypertension associated with diabetes ([place]) 08/25/2014   Arteriosclerosis of coronary artery 09/09/2013   CAD (coronary artery disease) 09/09/2013   Microalbuminuria 09/08/2013   Diabetes mellitus ([place]) 09/08/2013    Past Surgical History:  Procedure Laterality Date   COLONOSCOPY N/A 10/21/2015   Procedure: COLONOSCOPY;  Surgeon: Milus Banister, MD;  Location: [facility];  Service: Endoscopy;  Laterality: N/A;   COLONOSCOPY WITH PROPOFOL N/A 12/28/2019   Procedure: COLONOSCOPY WITH PROPOFOL;  Surgeon: Lesly Rubenstein, MD;  Location: [facility];  Service: Endoscopy;  Laterality: N/A;   EXPLORATION POST OPERATIVE OPEN HEART     EYE SURGERY Bilateral     Prior to Admission medications   Medication  Sig Start Date End Date Taking? Authorizing Provider  amLODipine (NORVASC) 5 MG tablet Take 5 mg by mouth 2 (two) times daily. 12/17/19  Yes [provider]  atorvastatin (LIPITOR) 40 MG tablet Take 40 mg by mouth daily.   Yes [provider]  clopidogrel (PLAVIX) 75 MG tablet Take 75 mg by mouth at bedtime.    Yes [provider]  Cyanocobalamin 1000 MCG TBCR Take 1,000 mcg by mouth daily.     Yes [provider]  ferrous sulfate 325 (65 FE) MG tablet Take 325 mg by mouth 3 (three) times daily with meals.    Yes [provider]  fluticasone (FLONASE) 50 MCG/ACT nasal spray Place 1 spray into both nostrils daily as needed for allergies.  06/03/13  Yes [provider]  furosemide (LASIX) 20 MG tablet Take 40 mg by mouth 2 (two) times daily. 10/11/17  Yes [provider]  insulin aspart (NOVOLOG) 100 UNIT/ML FlexPen Inject 4-14 Units into the skin See admin instructions. Inject under the skin according to sliding scale: Breakfast (4u <100 or 8u >100) Lunch (6u <100 or 12u >100) Supper (7u <100 or 14u >100)   Yes [provider]  Insulin Glargine (BASAGLAR KWIKPEN) 100 UNIT/ML Inject 20 Units into the skin at bedtime.  11/02/19  Yes [provider]  metoprolol tartrate (LOPRESSOR) 50 MG tablet TAKE 1 TABLET BY MOUTH TWICE DAILY 11/18/17  Yes [provider]  omeprazole (PRILOSEC) 40 MG capsule Take 40 mg by mouth 2 (two) times daily.    Yes [provider]  telmisartan (MICARDIS) 80 MG tablet Take 1 tablet by mouth daily. 12/01/19  Yes [provider]  isosorbide mononitrate (IMDUR) 30 MG 24 hr tablet Take 30 mg by mouth daily. Patient not taking: Reported on 01/21/2021 12/10/19   [provider]  metFORMIN (GLUCOPHAGE) 500 MG tablet Take 500 mg by mouth 2 (two) times daily. Patient not taking: Reported on 01/24/2021 07/23/17   [provider]  nitroGLYCERIN (NITROSTAT) 0.4 MG SL tablet take 1 tablet under the tongue every 5 minutes if needed for chest pain 10/29/14   [provider]    Allergies Neuromuscular blocking agents, Other, and Shrimp [shellfish allergy]  Family History  Problem Relation Age of Onset   Diabetes Mellitus I Mother    Kidney failure Mother    Alcoholism Father    Heart attack Sister    Prostate cancer Brother     Social History Social History   Tobacco  Use   Smoking status: Former    Types: Cigarettes    Quit date: 01/30/1995    Years since quitting: 25.9   Smokeless tobacco: Never  Vaping Use   Vaping Use: Never used  Substance Use Topics   Alcohol use: No   Drug use: No    Review of Systems  Review of Systems  Constitutional:  Negative for chills and fever.  HENT:  Negative for sore throat.   Eyes:  Negative for pain.  Respiratory:  Positive for cough. Negative for stridor.   Cardiovascular:  Negative for chest pain.  Gastrointestinal:  Negative for vomiting.  Musculoskeletal:  Positive for back pain.  Skin:  Negative for rash.  Neurological:  Negative for seizures, loss of consciousness and headaches.  Psychiatric/Behavioral:  Negative for suicidal ideas.   All other systems reviewed and are negative.    ____________________________________________   PHYSICAL EXAM:  VITAL SIGNS: ED Triage Vitals  Enc Vitals Group  BP 01/07/2021 2044 (!) 173/61     Pulse Rate 01/08/2021 2044 86     Resp 01/10/2021 2044 16     Temp 01/09/2021 2044 (!) 100.5 F (38.1 C)     Temp Source 01/26/2021 2044 Oral     SpO2 01/26/2021 2044 (!) 86 %     Weight 01/02/2021 2045 200 lb (90.7 kg)     Height 01/26/2021 2045 5\' 7"  (1.702 m)     Head Circumference --      Peak Flow --      Pain Score 01/19/2021 2045 5     Pain Loc --      Pain Edu? --      Excl. in [place]? --    Vitals:   01/02/2021 2130 01/05/2021 2302  BP: (!) 156/62   Pulse: 87   Resp: (!) 26   Temp:  98.9 F (37.2 C)  SpO2: 95%    Physical Exam Vitals and nursing note reviewed.  Constitutional:      General: He is not in acute distress.    Appearance: He is well-developed.  HENT:     Head: Normocephalic and atraumatic.     Right Ear: External ear normal.     Left Ear: External ear normal.     Nose: Nose normal.  Eyes:     Conjunctiva/sclera: Conjunctivae normal.  Cardiovascular:     Rate and Rhythm: Normal rate and regular rhythm.     Heart sounds: No murmur heard. Pulmonary:      Effort: Tachypnea and respiratory distress present.     Breath sounds: Normal breath sounds.  Abdominal:     Palpations: Abdomen is soft.     Tenderness: There is no abdominal tenderness. There is no right CVA tenderness or left CVA tenderness.  Musculoskeletal:        General: No swelling.     Cervical back: Neck supple.     Right lower leg: Edema present.     Left lower leg: Edema present.  Skin:    General: Skin is warm and dry.     Capillary Refill: Capillary refill takes less than 2 seconds.  Neurological:     Mental Status: He is alert.  Psychiatric:        Mood and Affect: Mood normal.    Patient has full strength out of bilateral upper and lower extremities.  Sensation is intact light touch of the lower extremities.  2+ bilateral patellar reflexes.  Patient is mild tenderness over the L-spine but no overlying skin changes. ____________________________________________   LABS (all labs ordered are listed, but only abnormal results are displayed)  Labs Reviewed  COMPREHENSIVE METABOLIC PANEL - Abnormal; Notable for the following components:      Result Value   Glucose, Bld 134 (*)    BUN 43 (*)    Creatinine, Ser 2.62 (*)    Total Protein 8.2 (*)    GFR, Estimated 23 (*)    All other components within normal limits  CBC WITH DIFFERENTIAL/PLATELET - Abnormal; Notable for the following components:   WBC 14.3 (*)    RBC 4.01 (*)    Hemoglobin 10.6 (*)    HCT 33.6 (*)    Platelets 139 (*)    Neutro Abs 11.1 (*)    Monocytes Absolute 1.4 (*)    All other components within normal limits  APTT - Abnormal; Notable for the following components:   aPTT 39 (*)    All other components within normal limits  URINALYSIS, COMPLETE (UACMP) WITH MICROSCOPIC - Abnormal; Notable for the following components:   Protein, ur 100 (*)    Bacteria, UA RARE (*)    All other components within normal limits  BRAIN NATRIURETIC PEPTIDE - Abnormal; Notable for the following components:    B Natriuretic Peptide 1,351.3 (*)    All other components within normal limits  TROPONIN I (HIGH SENSITIVITY) - Abnormal; Notable for the following components:   Troponin I (High Sensitivity) 45 (*)    All other components within normal limits  RESP PANEL BY RT-PCR (FLU A&B, COVID) ARPGX2  CULTURE, BLOOD (ROUTINE X 2)  CULTURE, BLOOD (ROUTINE X 2)  URINE CULTURE  LACTIC ACID, PLASMA  PROTIME-INR  LACTIC ACID, PLASMA   ____________________________________________  EKG  ECG remarkable for A. fib with a ventricular premature complex, right bundle branch block and left anterior fascicle block with nonspecific ST changes in inferior and lateral and anterior leads.  QTc interval is prolonged at 509. ____________________________________________  RADIOLOGY  ED MD interpretation:    Chest x-ray shows some mild vascular congestion without clear focal consolidation, effusion, pneumothorax or other clear acute thoracic process.  CT chest abdomen pelvis shows development of round plaques opacity in the right upper lobe and some scattered regions of groundglass opacity within the right lung.  Concerning for pneumonia.  He was also nodularity noted.  There is evidence of the abdomen pelvis with cholelithiasis without evidence of cholecystitis as well as diverticulosis without diverticulitis.  There is trace free fluid and fat stranding within the left pelvis but no evidence of kidney stone, perinephric stranding, diverticulitis, or other clear acute abdominal or pelvic process.  Reformats of the T and L-spine show no acute fracture dislocation and chronically stable L1-L2 compression fractures.  Official radiology report(s): CT T-SPINE NO CHARGE  Result Date: 01/05/2021 CLINICAL DATA:  lower mid back pain for approx 3 weeks that comes and goes. Denies falling or injury, denies hx of back pain, denies urinary changes. EXAM: CT THORACIC AND LUMBAR SPINE WITHOUT CONTRAST TECHNIQUE: Multidetector  CT imaging of the thoracic and lumbar spine was performed without contrast. Multiplanar CT image reconstructions were also generated. COMPARISON:  MRI lumbar spine 08/24/2009 FINDINGS: CT THORACIC SPINE FINDINGS Alignment: Normal. Vertebrae: Diffusely decreased bone density. Multilevel mild degenerative changes of the spine. Superior endplate T 7 Schmorl node. No acute fracture or focal pathologic process. Paraspinal and other soft tissues: Negative. Disc levels: Maintained. CT LUMBAR SPINE FINDINGS Segmentation: 5 lumbar type vertebrae. Alignment: Normal. Vertebrae: Diffusely decreased bone density. Chronic compression fracture of the L1 vertebral body with greater than 35% vertebral body height loss. Chronic compression fracture of the L2 vertebral body with greater than 45% height loss. No acute fracture or focal pathologic process. Paraspinal and other soft tissues: Negative. Disc levels: Maintained. IMPRESSION: CT THORACIC SPINE IMPRESSION No acute displaced fracture or traumatic listhesis of the thoracic spine. CT LUMBAR SPINE IMPRESSION No acute displaced fracture or traumatic listhesis of the lumbar spine in a patient with chronic grossly stable L1 and L2 compression fractures. Please see separately dictated CT chest, abdomen, pelvis 01/26/2021. Aortic Atherosclerosis (ICD10-I70.0) and Emphysema (ICD10-J43.9). Electronically Signed   By: Iven Finn M.D.   On: 01/28/2021 22:46   CT L-SPINE NO CHARGE  Result Date: 01/05/2021 CLINICAL DATA:  lower mid back pain for approx 3 weeks that comes and goes. Denies falling or injury, denies hx of back pain, denies urinary changes. EXAM: CT THORACIC AND LUMBAR SPINE WITHOUT CONTRAST TECHNIQUE: Multidetector CT  imaging of the thoracic and lumbar spine was performed without contrast. Multiplanar CT image reconstructions were also generated. COMPARISON:  MRI lumbar spine 08/24/2009 FINDINGS: CT THORACIC SPINE FINDINGS Alignment: Normal. Vertebrae: Diffusely  decreased bone density. Multilevel mild degenerative changes of the spine. Superior endplate T 7 Schmorl node. No acute fracture or focal pathologic process. Paraspinal and other soft tissues: Negative. Disc levels: Maintained. CT LUMBAR SPINE FINDINGS Segmentation: 5 lumbar type vertebrae. Alignment: Normal. Vertebrae: Diffusely decreased bone density. Chronic compression fracture of the L1 vertebral body with greater than 35% vertebral body height loss. Chronic compression fracture of the L2 vertebral body with greater than 45% height loss. No acute fracture or focal pathologic process. Paraspinal and other soft tissues: Negative. Disc levels: Maintained. IMPRESSION: CT THORACIC SPINE IMPRESSION No acute displaced fracture or traumatic listhesis of the thoracic spine. CT LUMBAR SPINE IMPRESSION No acute displaced fracture or traumatic listhesis of the lumbar spine in a patient with chronic grossly stable L1 and L2 compression fractures. Please see separately dictated CT chest, abdomen, pelvis 01/14/2021. Aortic Atherosclerosis (ICD10-I70.0) and Emphysema (ICD10-J43.9). Electronically Signed   By: Iven Finn M.D.   On: 01/02/2021 22:46   DG Chest Port 1 View  Result Date: 01/24/2021 CLINICAL DATA:  Back pain for 3 weeks, initial encounter EXAM: PORTABLE CHEST 1 VIEW COMPARISON:  11/07/2019 FINDINGS: Postsurgical changes are again seen and stable. Cardiac shadow is mildly enlarged. Aortic calcifications are noted. Lungs are clear bilaterally. Mild vascular congestion is noted centrally. No bony abnormality is noted. IMPRESSION: Mild vascular congestion without other acute abnormality Electronically Signed   By: Inez Catalina M.D.   On: 01/14/2021 21:13   CT CHEST ABDOMEN PELVIS WO CONTRAST  Result Date: 01/08/2021 CLINICAL DATA:  Sepsis. Mid back pain for approx 3 weeks that comes and goes. Denies falling or injury, denies hx of back pain, denies urinary changes. EXAM: CT CHEST, ABDOMEN AND PELVIS  WITHOUT CONTRAST TECHNIQUE: Multidetector CT imaging of the chest, abdomen and pelvis was performed following the standard protocol without IV contrast. COMPARISON:  CT abdomen pelvis 04/27/2012. FINDINGS: CT CHEST FINDINGS Cardiovascular: Prominent heart size. No significant pericardial effusion. The thoracic aorta is normal in caliber. Severe atherosclerotic plaque of the thoracic aorta. Aortic valve replacement. Four-vessel coronary artery calcifications status post coronary artery bypass graft. The main pulmonary artery is normal in caliber. Mediastinum/Nodes: No gross hilar adenopathy, noting limited sensitivity for the detection of hilar adenopathy on this noncontrast study. No enlarged mediastinal or axillary lymph nodes. Thyroid gland, trachea, and esophagus demonstrate no significant findings. Lungs/Pleura: Mild emphysematous changes. Left lower lobe atelectasis versus scarring. No focal consolidation. There is a new 1.2 cm ground-glass airspace opacity within the right upper lobe (4:45, 5:84). Few other scattered regions of less well-defined ground-glass airspace opacity within the right lung. No pulmonary mass. No pleural effusion. No pneumothorax. Musculoskeletal: No chest wall abnormality. No suspicious lytic or blastic osseous lesions. No acute displaced fracture. Please see separately dictated CT thoracolumbar spine 01/12/2021. CT ABDOMEN PELVIS FINDINGS Hepatobiliary: No focal liver abnormality. Calcified gallstone noted within the gallbladder lumen. No gallbladder wall thickening or pericholecystic fluid. A calcified stone is noted in the distal common bile duct measuring up to 3 mm (5:70, 2:66). No biliary dilatation. Pancreas: No focal lesion. Normal pancreatic contour. No surrounding inflammatory changes. No main pancreatic ductal dilatation. Spleen: Normal in size without focal abnormality. Adrenals/Urinary Tract: No adrenal nodule bilaterally. Nonspecific bilateral perinephric stranding.  Calcifications associated with the kidneys likely vascular. No nephrolithiasis and  no hydronephrosis. No definite contour-deforming renal mass. No ureterolithiasis or hydroureter. The urinary bladder is unremarkable. Stomach/Bowel: Stomach is within normal limits. No evidence of bowel wall thickening or dilatation. Diffuse colonic diverticulosis. Appendix appears normal. Vascular/Lymphatic: No abdominal aorta or iliac aneurysm. Severe atherosclerotic plaque of the aorta and its branches. No abdominal, pelvic, or inguinal lymphadenopathy. Reproductive: Prostate is unremarkable. Other: Trace free fluid and fat stranding within the left pelvis just anterior to the inguinal canal of unclear etiology (2:94, 5:67). Finding may be related to a small left inguinal hernia. No intraperitoneal free gas. No organized fluid collection. Musculoskeletal: No abdominal wall hernia or abnormality. No suspicious lytic or blastic osseous lesions. No acute displaced fracture. Please see separately dictated CT thoracolumbar spine 01/23/2021. IMPRESSION: 1. Interval development of a a 1.2 cm ground-glass airspace opacity within the right upper lobe as well as few other scattered regions of less well-defined ground-glass airspace opacity within the right lung. Findings represent infection/inflammation. Non-contrast chest CT at 3-6 months is recommended. If nodules persist, subsequent management will be based upon the most suspicious nodule(s). This recommendation follows the consensus statement: Guidelines for Management of Incidental Pulmonary Nodules Detected on CT Images: From the Fleischner Society 2017; Radiology 2017; 284:228-243. 2. Cholelithiasis as well as 3 mm distal common bile duct cholelithiasis. No associate CT findings of acute cholecystitis or biliary ductal dilatation. 3. Diffuse colonic diverticulosis with no acute diverticulitis. 4. Trace free fluid and fat stranding within the left pelvis just anterior to the inguinal  canal of unclear etiology. Finding may be related to a small left inguinal hernia. Correlate with physical exam for inguinal hernia incarceration. Recommend attention on follow-up. 5. Aortic Atherosclerosis (ICD10-I70.0) and Emphysema (ICD10-J43.9). 6. Please note markedly limited evaluation on this noncontrast study. 7. Please see separately dictated CT thoracolumbar spine 01/16/2021. Electronically Signed   By: Iven Finn M.D.   On: 01/04/2021 22:36    ____________________________________________   PROCEDURES  Procedure(s) performed (including Critical Care):  .Critical Care Performed by: Lucrezia Starch, MD Authorized by: Lucrezia Starch, MD   Critical care provider statement:    Critical care time (minutes):  30   Critical care was necessary to treat or prevent imminent or life-threatening deterioration of the following conditions:  Respiratory failure and sepsis   Critical care was time spent personally by me on the following activities:  Development of treatment plan with patient or surrogate, discussions with consultants, evaluation of patient's response to treatment, examination of patient, ordering and review of laboratory studies, ordering and review of radiographic studies, ordering and performing treatments and interventions, pulse oximetry, re-evaluation of patient's condition and review of old charts   ____________________________________________   INITIAL IMPRESSION / [place] / ED COURSE        Patient presents with above-stated history and exam primarily with concerns for some nontraumatic pain in his low back.  He does endorse a very mild cough but denies any other respiratory symptoms or fevers.  He was on arrival noted to be febrile and hypoxic with SPO2 of 86% on room air and temperature 100.5.  Also noted to be slightly hypertensive.  Lungs sound clear bilaterally although he does have some edema in lower extremities.  His back is relatively  unremarkable on exam and he has full strength and sensation throughout the bilateral lower extremities.  Unclear if the back discomfort is related to hypoxia and fever not.  Patient does appear mildly volume overloaded.  With regard to hypoxia differential considerations  include pneumonia, CHF, ACS, arrhythmia, PE, anemia and metabolic derangements versus some hypoventilation from other cause.  With regard to low back pain differential considerations include possible acute on chronic compression fracture, kidney stone, AAA, cholecystitis, kidney stone and MSK.  Lower suspicion at this time for spinal infection.   ECG remarkable for A. fib with a ventricular premature complex, right bundle branch block and left anterior fascicle block with nonspecific ST changes in inferior and lateral and anterior leads.  QTc interval is prolonged at 509.  Chest x-ray shows some mild vascular congestion without clear focal consolidation, effusion, pneumothorax or other clear acute thoracic process.  CT chest abdomen pelvis shows development of round plaques opacity in the right upper lobe and some scattered regions of groundglass opacity within the right lung.  Concerning for pneumonia.  He was also nodularity noted.  There is evidence of the abdomen pelvis with cholelithiasis without evidence of cholecystitis as well as diverticulosis without diverticulitis.  There is trace free fluid and fat stranding within the left pelvis but no evidence of kidney stone, perinephric stranding, diverticulitis, or other clear acute abdominal or pelvic process.  Reformats of the T and L-spine show no acute fracture dislocation and chronically stable L1-L2 compression fractures.  CMP shows stable kidney function without new acute electrolyte or metabolic derangements.  CBC shows WBC count of 14.3 with stable hemoglobin and normal platelets.  Lactic acid is not elevated at 1.5.  INR is 1.  BNP is elevated at 1251 and supports  findings on exam suggestive of a component of acute volume overload.  COVID and influenza PCR is negative.  UA is unremarkable for evidence of urinary tract infection.  Based on CT imaging and concern for sepsis from a pneumonia in the setting of an acute CHF exacerbation.  Unclear etiology for patient's back discomfort at this time.  We will start patient antibiotics to cover for [facility] pneumonia and give a dose of Lasix.  Admit to medicine service for further evaluation and management.  Patient meets multiple SIRS criteria with fever leukocytosis and tachypnea and technically meets sepsis definition will defer fluids given he appears more volume overloaded at this time.     ____________________________________________   FINAL CLINICAL IMPRESSION(S) / ED DIAGNOSES  Final diagnoses:  Community acquired pneumonia, unspecified laterality  Acute congestive heart failure, unspecified heart failure type ([place])  Acute midline low back pain without sciatica  Sepsis, due to unspecified organism, unspecified whether acute organ dysfunction present ([place])    Medications  cefTRIAXone (ROCEPHIN) 1 g in sodium chloride 0.9 % 100 mL IVPB (1 g Intravenous New Bag/Given 01/14/2021 2257)  azithromycin (ZITHROMAX) 500 mg in sodium chloride 0.9 % 250 mL IVPB (500 mg Intravenous New Bag/Given 01/07/2021 2259)  acetaminophen (TYLENOL) tablet 1,000 mg (1,000 mg Oral Given 01/04/2021 2119)  furosemide (LASIX) injection 40 mg (40 mg Intravenous Given 01/05/2021 2255)     ED Discharge Orders     None        Note:  This document was prepared using Dragon voice recognition software and may include unintentional dictation errors.    Lucrezia Starch, MD 01/23/2021 [phone]

## 2021-01-17 NOTE — Sepsis Progress Note (Signed)
Monitoring for the code sepsis protocol. °

## 2021-01-17 NOTE — Progress Notes (Signed)
CODE SEPSIS - PHARMACY COMMUNICATION  **Broad Spectrum Antibiotics should be administered within 1 hour of Sepsis diagnosis**  Time Code Sepsis Called/Page Received: 2248  Antibiotics Ordered: ceftriaxone and azithromycin  Time of 1st antibiotic administration: [place] ,PharmD Clinical Pharmacist  01/28/2021  10:52 PM

## 2021-01-17 NOTE — ED Triage Notes (Addendum)
Pt to ED from [place] side via EMS c/o lower mid back pain for approx 3 weeks that comes and goes.  Denies falling or injury, denies hx of back pain, denies urinary changes.  States hx of DBM and takes 9 units insulin every night.  EMS vitals 133/69, 87 afib with hx, 93% RA, 145 CBG.  Pt has not taken any meds at home for pain or called his PCP.  Pt oxygen level 87% RA in triage, placed on 2L [place] and up to 96%, denies oxygen use at home.  Pt A&Ox4, chest rise even and unlabored, skin WNL and in NAD at this time.

## 2021-01-18 ENCOUNTER — Inpatient Hospital Stay
Admit: 2021-01-18 | Discharge: 2021-01-18 | Disposition: A | Discharge status: Home / Self Care | Payer: Medicare Other | Attending: Family Medicine | Admitting: Family Medicine

## 2021-01-18 DIAGNOSIS — I50811 Acute right heart failure: Secondary | ICD-10-CM

## 2021-01-18 DIAGNOSIS — I5022 Chronic systolic (congestive) heart failure: Secondary | ICD-10-CM

## 2021-01-18 LAB — BASIC METABOLIC PANEL
Anion gap: 7 (ref 5–15)
BUN: 45 mg/dL — ABNORMAL HIGH (ref 8–23)
CO2: 24 mmol/L (ref 22–32)
Calcium: 8.9 mg/dL (ref 8.9–10.3)
Chloride: 107 mmol/L (ref 98–111)
Creatinine, Ser: 2.67 mg/dL — ABNORMAL HIGH (ref 0.61–1.24)
GFR, Estimated: 22 mL/min — ABNORMAL LOW (ref >60)
Glucose, Bld: 214 mg/dL — ABNORMAL HIGH (ref 70–99)
Potassium: 3.5 mmol/L (ref 3.5–5.1)
Sodium: 138 mmol/L (ref 135–145)

## 2021-01-18 LAB — PROTIME-INR
INR: 1.1 (ref 0.8–1.2)
Prothrombin Time: 13.9 seconds (ref 11.4–15.2)

## 2021-01-18 LAB — CBC
HCT: 30.3 % — ABNORMAL LOW (ref 39.0–52.0)
Hemoglobin: 9.5 g/dL — ABNORMAL LOW (ref 13.0–17.0)
MCH: 26.3 pg (ref 26.0–34.0)
MCHC: 31.4 g/dL (ref 30.0–36.0)
MCV: 83.9 fL (ref 80.0–100.0)
Platelets: 125 10*3/uL — ABNORMAL LOW (ref 150–400)
RBC: 3.61 MIL/uL — ABNORMAL LOW (ref 4.22–5.81)
RDW: 14.4 % (ref 11.5–15.5)
WBC: 13.7 10*3/uL — ABNORMAL HIGH (ref 4.0–10.5)
nRBC: 0 % (ref 0.0–0.2)

## 2021-01-18 LAB — APTT: aPTT: 36 seconds (ref 24–36)

## 2021-01-18 LAB — GLUCOSE, CAPILLARY
Glucose-Capillary: 201 mg/dL — ABNORMAL HIGH (ref 70–99)
Glucose-Capillary: 229 mg/dL — ABNORMAL HIGH (ref 70–99)
Glucose-Capillary: 196 mg/dL — ABNORMAL HIGH (ref 70–99)

## 2021-01-18 LAB — LACTIC ACID, PLASMA
Lactic Acid, Venous: 1.2 mmol/L (ref 0.5–1.9)
Lactic Acid, Venous: 0.9 mmol/L (ref 0.5–1.9)
Lactic Acid, Venous: 0.9 mmol/L (ref 0.5–1.9)

## 2021-01-18 LAB — MAGNESIUM: Magnesium: 1.9 mg/dL (ref 1.7–2.4)

## 2021-01-18 MED ORDER — HYDROCOD POLST-CPM POLST ER 10-8 MG/5ML PO SUER
5.0000 mL | Freq: Two times a day (BID) | ORAL | Status: DC | PRN
  Administered 2021-01-19 – 2021-01-21 (×3): 5 mL via ORAL
  Filled 2021-01-18 (×3): qty 5

## 2021-01-18 MED ORDER — NITROGLYCERIN 0.4 MG SL SUBL
0.4000 mg | SUBLINGUAL_TABLET | SUBLINGUAL | Status: DC | PRN
  Administered 2021-01-23 (×2): 0.4 mg via SUBLINGUAL
  Filled 2021-01-18: qty 1

## 2021-01-18 MED ORDER — AMLODIPINE BESYLATE 5 MG PO TABS
5.0000 mg | ORAL_TABLET | Freq: Two times a day (BID) | ORAL | Status: DC
  Administered 2021-01-18 – 2021-01-21 (×8): 5 mg via ORAL
  Filled 2021-01-18 (×9): qty 1

## 2021-01-18 MED ORDER — INSULIN GLARGINE-YFGN 100 UNIT/ML ~~LOC~~ SOLN
20.0000 [IU] | Freq: Every day | SUBCUTANEOUS | Status: DC
  Administered 2021-01-18: 21:00:00 20 [IU] via SUBCUTANEOUS
  Filled 2021-01-18 (×2): qty 0.2

## 2021-01-18 MED ORDER — ACETAMINOPHEN 650 MG RE SUPP: 650.0000 mg | Freq: Four times a day (QID) | RECTAL | Status: DC | PRN

## 2021-01-18 MED ORDER — METOPROLOL TARTRATE 50 MG PO TABS
75.0000 mg | ORAL_TABLET | Freq: Two times a day (BID) | ORAL | Status: DC
  Administered 2021-01-18: 18:00:00 50 mg via ORAL
  Administered 2021-01-19 – 2021-01-21 (×5): 75 mg via ORAL
  Filled 2021-01-18 (×6): qty 1

## 2021-01-18 MED ORDER — ACETAMINOPHEN 325 MG PO TABS
650.0000 mg | ORAL_TABLET | Freq: Four times a day (QID) | ORAL | Status: DC | PRN
  Administered 2021-01-18 – 2021-01-19 (×3): 650 mg via ORAL
  Filled 2021-01-18 (×4): qty 2

## 2021-01-18 MED ORDER — INSULIN ASPART 100 UNIT/ML IJ SOLN
INTRAMUSCULAR | Status: AC
  Administered 2021-01-18: 09:00:00 3 [IU] via SUBCUTANEOUS
  Filled 2021-01-18: qty 1

## 2021-01-18 MED ORDER — FERROUS SULFATE 325 (65 FE) MG PO TABS
325.0000 mg | ORAL_TABLET | Freq: Three times a day (TID) | ORAL | Status: DC
  Administered 2021-01-18 – 2021-01-26 (×23): 325 mg via ORAL
  Filled 2021-01-18 (×23): qty 1

## 2021-01-18 MED ORDER — MAGNESIUM HYDROXIDE 400 MG/5ML PO SUSP
30.0000 mL | Freq: Every day | ORAL | Status: DC | PRN
  Filled 2021-01-18: qty 30

## 2021-01-18 MED ORDER — INSULIN ASPART 100 UNIT/ML IJ SOLN
0.0000 [IU] | Freq: Three times a day (TID) | INTRAMUSCULAR | Status: DC
  Administered 2021-01-18: 13:00:00 3 [IU] via SUBCUTANEOUS
  Administered 2021-01-18: 18:00:00 2 [IU] via SUBCUTANEOUS
  Administered 2021-01-19 (×2): 3 [IU] via SUBCUTANEOUS
  Administered 2021-01-20: 09:00:00 1 [IU] via SUBCUTANEOUS
  Administered 2021-01-20: 13:00:00 2 [IU] via SUBCUTANEOUS
  Administered 2021-01-20: 17:00:00 5 [IU] via SUBCUTANEOUS
  Administered 2021-01-21: 09:00:00 3 [IU] via SUBCUTANEOUS
  Administered 2021-01-21: 12:00:00 5 [IU] via SUBCUTANEOUS
  Administered 2021-01-21 – 2021-01-22 (×4): 2 [IU] via SUBCUTANEOUS
  Administered 2021-01-23 (×2): 1 [IU] via SUBCUTANEOUS
  Administered 2021-01-23: 13:00:00 2 [IU] via SUBCUTANEOUS
  Administered 2021-01-24: 18:00:00 3 [IU] via SUBCUTANEOUS
  Administered 2021-01-24 (×2): 2 [IU] via SUBCUTANEOUS
  Administered 2021-01-25 (×2): 3 [IU] via SUBCUTANEOUS
  Administered 2021-01-25: 08:00:00 5 [IU] via SUBCUTANEOUS
  Administered 2021-01-26 (×2): 3 [IU] via SUBCUTANEOUS
  Filled 2021-01-18 (×23): qty 1

## 2021-01-18 MED ORDER — GUAIFENESIN ER 600 MG PO TB12
600.0000 mg | ORAL_TABLET | Freq: Two times a day (BID) | ORAL | Status: DC
  Administered 2021-01-18 – 2021-01-26 (×17): 600 mg via ORAL
  Filled 2021-01-18 (×17): qty 1

## 2021-01-18 MED ORDER — IPRATROPIUM-ALBUTEROL 0.5-2.5 (3) MG/3ML IN SOLN
3.0000 mL | Freq: Four times a day (QID) | RESPIRATORY_TRACT | Status: DC
  Administered 2021-01-18: 08:00:00 3 mL via RESPIRATORY_TRACT
  Filled 2021-01-18: qty 3

## 2021-01-18 MED ORDER — PANTOPRAZOLE SODIUM 40 MG PO TBEC
40.0000 mg | DELAYED_RELEASE_TABLET | Freq: Every day | ORAL | Status: DC
  Administered 2021-01-18 – 2021-01-26 (×9): 40 mg via ORAL
  Filled 2021-01-18 (×9): qty 1

## 2021-01-18 MED ORDER — ENOXAPARIN SODIUM 40 MG/0.4ML IJ SOSY
40.0000 mg | PREFILLED_SYRINGE | INTRAMUSCULAR | Status: DC
  Administered 2021-01-18: 09:00:00 40 mg via SUBCUTANEOUS
  Filled 2021-01-18: qty 0.4

## 2021-01-18 MED ORDER — INSULIN ASPART 100 UNIT/ML IJ SOLN
2.0000 [IU] | Freq: Three times a day (TID) | INTRAMUSCULAR | Status: DC
  Administered 2021-01-18 – 2021-01-26 (×22): 2 [IU] via SUBCUTANEOUS
  Filled 2021-01-18 (×23): qty 1

## 2021-01-18 MED ORDER — ONDANSETRON HCL 4 MG PO TABS: 4.0000 mg | ORAL_TABLET | Freq: Four times a day (QID) | ORAL | Status: DC | PRN

## 2021-01-18 MED ORDER — SODIUM CHLORIDE 0.9 % IV SOLN: 500.0000 mg | INTRAVENOUS | Status: DC

## 2021-01-18 MED ORDER — FLUTICASONE PROPIONATE 50 MCG/ACT NA SUSP
1.0000 | Freq: Every day | NASAL | Status: DC | PRN
  Filled 2021-01-18: qty 16

## 2021-01-18 MED ORDER — SODIUM CHLORIDE 0.9 % IV SOLN
2.0000 g | INTRAVENOUS | Status: DC
  Administered 2021-01-18 – 2021-01-19 (×2): 2 g via INTRAVENOUS
  Filled 2021-01-18 (×2): qty 2
  Filled 2021-01-18: qty 20

## 2021-01-18 MED ORDER — SODIUM CHLORIDE 0.9 % IV SOLN: 2.0000 g | INTRAVENOUS | Status: DC

## 2021-01-18 MED ORDER — SODIUM CHLORIDE 0.9 % IV SOLN
500.0000 mg | INTRAVENOUS | Status: DC
  Administered 2021-01-18 – 2021-01-19 (×2): 500 mg via INTRAVENOUS
  Filled 2021-01-18 (×3): qty 5

## 2021-01-18 MED ORDER — ATORVASTATIN CALCIUM 20 MG PO TABS
40.0000 mg | ORAL_TABLET | Freq: Every day | ORAL | Status: DC
  Administered 2021-01-18 – 2021-01-26 (×9): 40 mg via ORAL
  Filled 2021-01-18 (×9): qty 2

## 2021-01-18 MED ORDER — IPRATROPIUM-ALBUTEROL 0.5-2.5 (3) MG/3ML IN SOLN
3.0000 mL | Freq: Four times a day (QID) | RESPIRATORY_TRACT | Status: DC | PRN
  Administered 2021-01-19 – 2021-01-20 (×3): 3 mL via RESPIRATORY_TRACT
  Filled 2021-01-18 (×3): qty 3

## 2021-01-18 MED ORDER — METOPROLOL TARTRATE 50 MG PO TABS
50.0000 mg | ORAL_TABLET | Freq: Two times a day (BID) | ORAL | Status: DC
  Administered 2021-01-18: 09:00:00 50 mg via ORAL
  Filled 2021-01-18: qty 1

## 2021-01-18 MED ORDER — ENOXAPARIN SODIUM 30 MG/0.3ML IJ SOSY
30.0000 mg | PREFILLED_SYRINGE | INTRAMUSCULAR | Status: DC
  Administered 2021-01-19 – 2021-01-23 (×5): 30 mg via SUBCUTANEOUS
  Filled 2021-01-18 (×5): qty 0.3

## 2021-01-18 MED ORDER — CLOPIDOGREL BISULFATE 75 MG PO TABS
75.0000 mg | ORAL_TABLET | Freq: Every day | ORAL | Status: DC
  Administered 2021-01-18 – 2021-01-25 (×8): 75 mg via ORAL
  Filled 2021-01-18 (×8): qty 1

## 2021-01-18 MED ORDER — POTASSIUM CHLORIDE 20 MEQ PO PACK
40.0000 meq | PACK | Freq: Once | ORAL | Status: AC
  Administered 2021-01-18: 05:00:00 40 meq via ORAL
  Filled 2021-01-18: qty 2

## 2021-01-18 MED ORDER — BASAGLAR KWIKPEN 100 UNIT/ML ~~LOC~~ SOPN
20.0000 [IU] | PEN_INJECTOR | Freq: Every day | SUBCUTANEOUS | Status: DC
Start: 2021-01-18 — End: 2021-01-18

## 2021-01-18 MED ORDER — VITAMIN B-12 1000 MCG PO TABS
1000.0000 ug | ORAL_TABLET | Freq: Every day | ORAL | Status: DC
  Administered 2021-01-18 – 2021-01-26 (×9): 1000 ug via ORAL
  Filled 2021-01-18 (×9): qty 1

## 2021-01-18 MED ORDER — TRAZODONE HCL 50 MG PO TABS
25.0000 mg | ORAL_TABLET | Freq: Every evening | ORAL | Status: DC | PRN
  Administered 2021-01-20: 22:00:00 25 mg via ORAL
  Filled 2021-01-18: qty 1

## 2021-01-18 MED ORDER — ONDANSETRON HCL 4 MG/2ML IJ SOLN
4.0000 mg | Freq: Four times a day (QID) | INTRAMUSCULAR | Status: DC | PRN
  Administered 2021-01-23: 05:00:00 4 mg via INTRAVENOUS
  Filled 2021-01-18: qty 2

## 2021-01-18 MED ORDER — FUROSEMIDE 10 MG/ML IJ SOLN
40.0000 mg | Freq: Two times a day (BID) | INTRAMUSCULAR | Status: DC
  Administered 2021-01-18 – 2021-01-19 (×3): 40 mg via INTRAVENOUS
  Filled 2021-01-18 (×3): qty 4

## 2021-01-18 NOTE — Plan of Care (Signed)

## 2021-01-18 NOTE — Consult Note (Signed)
° °  Heart Failure Nurse Navigator Note  HFpEF 55 to 65%.  Mild to moderate aortic stenosis.  Echocardiogram results is pending on this admission.  He presented to the emergency room with complaints of 2 days of shortness of breath, weakness, disoriented, orthopnea and PND.  Comorbidities:  Coronary artery disease status post coronary artery bypass grafting  Carotid endarterectomy Chronic kidney disease stage III Diabetes type 2 CVA Hyperlipidemia Hypertension  Medications:  Amlodipine 5 mg 2 times a day Atorvastatin 40 mg daily Furosemide 40 mg IV every 12 hours Metoprolol tartrate 50 mg 2 times a day  To start Plavix 75 mg at bedtime and metoprolol tartrate has been increased to 75 mg 2 times a day   Labs:  BNP 1351, sodium 138, potassium 3.5, chloride 107, CO2 24, BUN 45, creatinine 2.67 Weight is 75 kg Blood pressure 134/68 Intake 200 mL Output 300 mL  Initial meeting with patient, he states that he resides at [facility]  He is widowed and lives by himself.  Admits to using salt on his food.  Discussed the importance of not adding extra salt to his food.  He states that he weighs himself daily and had not noted a weight gain.  Discussed fluid restriction, he states that he drinks 2 to 316 ounce bottles of water daily with 8 ounces of lemonade with each meal and 1 cup of coffee in the morning.  States that he is compliant with his medications of his medications are dispensed by the CNA's at the nursing home.  He states that he is not very active but will go out and sit on his porch.  He was given the living with heart failure teaching booklet along with his own magnet and information on low-sodium.  Is an appointment on January 9 at 9 AM at the outpatient heart failure clinic.  He has a 4% no-show of 2 out of 53 appointments.  Pricilla Riffle RN [facility]

## 2021-01-18 NOTE — Progress Notes (Signed)
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 followed at [facility] Admission for GI bleed 12/25/2019-5 mm polyp present at that time-prior GI bleed 2017  Presented 12/22 [facility] ED low back pain in the setting of mild cough X 3 weeks-found to have oxygen 87% on room air and started on oxygen, T-max 100.5 mildly hypotensive CXR 12/20 mild vascular congestion CT chest atelectasis lung bases no focal pneumonia no effusion Sodium 138 BUN/creatinine 45/2.6 (baseline 56/2.66), BNP 1351, troponin 45 White count 14.3-->13.7 hemoglobin 10.6-->9.5  Code sepsis called concerning admission for acute superimposed on chronic systolic heart failure in addition to probable right-sided community-acquired pneumonia versus aspiration pneumonia  Patient seen and examined at the bedside states feels well no distress Tells me he has been told about underlying  irregular fast heart rate in the past since his heart surgery Reports not feeling well but now feels overall better  O/e BP (!) 144/62 (BP Location: Right Arm)    Pulse 65    Temp 97.8 F (36.6 C)    Resp 18    Ht 5\' 7"  (1.702 m)    Wt 75 kg    SpO2 99%    BMI 25.90 kg/m   Alert coherent no distress EOMI NCAT no focal deficit Decreased air entry posterior lateral right lung fields moderate air entry ROM intact moving 4 limbs equally S1-S2 no murmur irregularly irregular on monitors Abdomen soft nontender no rebound no guarding Trace lower extremity edema   P Sepsis secondary to pneumonia -continue broad-spectrum ceftriaxone azithromycin, get SLP input A. fib RVR -rate control with metoprolol 50 twice daily-if needed would titrate upwards--cardiology to comment on anticoagulation given elevated CHADS2 score HFrEF -aiming for net negative balance received Lasix 40 and will be getting 40 twice daily IV with strict weights DM TY  2-continue sliding scale in addition to 20 units of long-acting insulin  Rest as per HPI  No charge  Verneita Griffes, MD [facility] 10:38 AM

## 2021-01-18 NOTE — Progress Notes (Signed)
*  PRELIMINARY RESULTS* Echocardiogram 2D Echocardiogram has been performed.  Wallie Char Herny Scurlock 01/18/2021, 2:30 PM

## 2021-01-18 NOTE — H&P (Signed)
PATIENT NAME: Ryan Mcclure    MR#:  [id number]  DATE OF BIRTH:  03-May-1931  DATE OF ADMISSION:  01/05/2021  PRIMARY CARE PHYSICIAN: Ryan Pink, MD   Patient is coming from: Home  REQUESTING/REFERRING PHYSICIAN: Hulan Saas, MD  CHIEF COMPLAINT:   Chief Complaint  Patient presents with   Back Pain    HISTORY OF PRESENT ILLNESS:  Ryan Mcclure is a 87 y.o. African-American male with medical history significant for systolic CHF, stage III chronic kidney disease, type 2 diabetes mellitus, CVA, GERD and dyslipidemia, presented to the ER with acute onset of low back pain for the last couple of days with radiculopathy.  He admitted to dry cough as well as dyspnea and wheezing lately with no fever or chills.  He denied any nausea or vomiting or abdominal pain.  He has been also having worsening lower extremity edema as well as dyspnea on exertion, orthopnea and paroxysmal nocturnal dyspnea.  No dysuria, oliguria or hematuria or flank pain.  ED Course: Upon presentation to the emergency room temperature was 100.5 with a blood pressure 173/61 with pulse oximetry  86% on room air that came up to 99% on 2.5 L O2 via nasal cannula.  Labs reviewed borderline potassium of 3.5 BUN of 43 with a creatinine of 2.60 close to previous levels and June of this year..  I sensitive troponin I was 45 and lactic acid 1.5.  CBC showed leukocytosis of 14.3 with neutrophilia and anemia.  INR is 1 and PT 13.6.  PTT was 39.  Influenza antigens and COVID-19 PCR came back negative.  EKG as reviewed by me : EKG showed atrial fibrillation with controlled ventricular sponsor of 90 with PVCs, right bundle branch block left anterior fascicular block. Imaging: Chest x-ray showed mild vascular congestion without other abnormalities. Chest abdomen CT without contrast revealed the following 1. Interval development of a a 1.2 cm ground-glass airspace opacity within the right upper lobe as well as  few other scattered regions of less well-defined ground-glass airspace opacity within the right lung. Findings represent infection/inflammation. Non-contrast chest CT at 3-6 months is recommended. If nodules persist, subsequent management will be based upon the most suspicious nodule(s). This recommendation follows the consensus statement: Guidelines for Management of Incidental Pulmonary Nodules Detected on CT Images: From the Fleischner Society 2017; Radiology 2017; 284:228-243. 2. Cholelithiasis as well as 3 mm distal common bile duct cholelithiasis. No associate CT findings of acute cholecystitis or biliary ductal dilatation. 3. Diffuse colonic diverticulosis with no acute diverticulitis. 4. Trace free fluid and fat stranding within the left pelvis just anterior to the inguinal canal of unclear etiology. Finding may be related to a small left inguinal hernia. Correlate with physical exam for inguinal hernia incarceration. Recommend attention on follow-up. 5. Aortic Atherosclerosis (ICD10-I70.0) and Emphysema (ICD10-J43.9).  The patient was given IV Rocephin and Zithromax, 40 mg of IV Lasix and 1 g of p.o. Tylenol.  He will be admitted to a progressive unit bed for further evaluation and management. PAST MEDICAL HISTORY:   Past Medical History:  Diagnosis Date   Chronic systolic CHF (congestive heart failure) (HCC)    CKD (chronic kidney disease), stage III (HCC)    Diabetes (HCC)    HBP (high blood pressure)    Heart attack (HCC)    High cholesterol    Stroke (cerebrum) (HCC)    Swelling     PAST SURGICAL HISTORY:   Past Surgical History:  Procedure Laterality Date   COLONOSCOPY N/A 10/21/2015   Procedure: COLONOSCOPY;  Surgeon: Milus Banister, MD;  Location: [facility];  Service: Endoscopy;  Laterality: N/A;   COLONOSCOPY WITH PROPOFOL N/A 12/28/2019   Procedure: COLONOSCOPY WITH PROPOFOL;  Surgeon: Lesly Rubenstein, MD;  Location: [facility]COPY;  Service:  Endoscopy;  Laterality: N/A;   EXPLORATION POST OPERATIVE OPEN HEART     EYE SURGERY Bilateral     SOCIAL HISTORY:   Social History   Tobacco Use   Smoking status: Former    Types: Cigarettes    Quit date: 01/30/1995    Years since quitting: 25.9   Smokeless tobacco: Never  Substance Use Topics   Alcohol use: No    FAMILY HISTORY:   Family History  Problem Relation Age of Onset   Diabetes Mellitus I Mother    Kidney failure Mother    Alcoholism Father    Heart attack Sister    Prostate cancer Brother     DRUG ALLERGIES:   Allergies  Allergen Reactions   Neuromuscular Blocking Agents Nausea And Vomiting and Other (See Comments)    Tongue swelling, lips swelling    Other Other (See Comments)    Tongue/lips swelling with steroid dose pack, pt does not recall exact name.    Shrimp [Shellfish Allergy]     REVIEW OF SYSTEMS:   ROS As per history of present illness. All pertinent systems were reviewed above. Constitutional, HEENT, cardiovascular, respiratory, GI, GU, musculoskeletal, neuro, psychiatric, endocrine, integumentary and hematologic systems were reviewed and are otherwise negative/unremarkable except for positive findings mentioned above in the HPI.   MEDICATIONS AT HOME:   Prior to Admission medications   Medication Sig Start Date End Date Taking? Authorizing Provider  amLODipine (NORVASC) 5 MG tablet Take 5 mg by mouth 2 (two) times daily. 12/17/19  Yes [provider]  atorvastatin (LIPITOR) 40 MG tablet Take 40 mg by mouth daily.   Yes [provider]  clopidogrel (PLAVIX) 75 MG tablet Take 75 mg by mouth at bedtime.    Yes [provider]  Cyanocobalamin 1000 MCG TBCR Take 1,000 mcg by mouth daily.    Yes [provider]  ferrous sulfate 325 (65 FE) MG tablet Take 325 mg by mouth 3 (three) times daily with meals.    Yes [provider]  fluticasone (FLONASE) 50 MCG/ACT nasal spray Place 1 spray into both  nostrils daily as needed for allergies.  06/03/13  Yes [provider]  furosemide (LASIX) 20 MG tablet Take 40 mg by mouth 2 (two) times daily. 10/11/17  Yes [provider]  insulin aspart (NOVOLOG) 100 UNIT/ML FlexPen Inject 4-14 Units into the skin See admin instructions. Inject under the skin according to sliding scale: Breakfast (4u <100 or 8u >100) Lunch (6u <100 or 12u >100) Supper (7u <100 or 14u >100)   Yes [provider]  Insulin Glargine (BASAGLAR KWIKPEN) 100 UNIT/ML Inject 20 Units into the skin at bedtime.  11/02/19  Yes [provider]  metoprolol tartrate (LOPRESSOR) 50 MG tablet TAKE 1 TABLET BY MOUTH TWICE DAILY 11/18/17  Yes [provider]  omeprazole (PRILOSEC) 40 MG capsule Take 40 mg by mouth 2 (two) times daily.    Yes [provider]  telmisartan (MICARDIS) 80 MG tablet Take 1 tablet by mouth daily. 12/01/19  Yes [provider]  isosorbide mononitrate (IMDUR) 30 MG 24 hr tablet Take 30 mg by mouth daily. Patient not taking: Reported on 01/11/2021  12/10/19   [provider]  metFORMIN (GLUCOPHAGE) 500 MG tablet Take 500 mg by mouth 2 (two) times daily. Patient not taking: Reported on 12/31/2020 07/23/17   [provider]  nitroGLYCERIN (NITROSTAT) 0.4 MG SL tablet take 1 tablet under the tongue every 5 minutes if needed for chest pain 10/29/14   [provider]      VITAL SIGNS:  Blood pressure (!) 156/62, pulse 87, temperature 98.9 F (37.2 C), temperature source Oral, resp. rate (!) 26, height 5\' 7"  (1.702 m), weight 90.7 kg, SpO2 95 %.  PHYSICAL EXAMINATION:  Physical Exam  GENERAL:  87 y.o.-year-old African-American male patient lying in the bed with mild respiratory distress with conversational dyspnea.   EYES: Pupils equal, round, reactive to light and accommodation. No scleral icterus. Extraocular muscles intact.  HEENT: Head atraumatic, normocephalic. Oropharynx and  nasopharynx clear.  NECK:  Supple, no jugular venous distention. No thyroid enlargement, no tenderness.  LUNGS: Diminished bibasilar breath sounds with bibasal crackles.  No use of accessory muscles of respiration.  CARDIOVASCULAR: Regular rate and rhythm, S1, S2 normal. No murmurs, rubs, or gallops.  ABDOMEN: Soft, nondistended, nontender. Bowel sounds present. No organomegaly or mass.  EXTREMITIES: 1-2+ left lower extremity pitting edema with no cyanosis, or clubbing.  NEUROLOGIC: Cranial nerves II through XII are intact. Muscle strength 5/5 in all extremities. Sensation intact. Gait not checked.  PSYCHIATRIC: The patient is alert and oriented x 3.  Normal affect and good eye contact. SKIN: No obvious rash, lesion, or ulcer.   LABORATORY PANEL:   CBC Recent Labs  Lab 01/21/2021 2051  WBC 14.3*  HGB 10.6*  HCT 33.6*  PLT 139*   ------------------------------------------------------------------------------------------------------------------  Chemistries  Recent Labs  Lab 01/19/2021 2051  NA 138  K 3.5  CL 105  CO2 24  GLUCOSE 134*  BUN 43*  CREATININE 2.62*  CALCIUM 9.4  AST 17  ALT 12  ALKPHOS 77  BILITOT 0.9   ------------------------------------------------------------------------------------------------------------------  Cardiac Enzymes No results for input(s): TROPONINI in the last 168 hours. ------------------------------------------------------------------------------------------------------------------  RADIOLOGY:  CT T-SPINE NO CHARGE  Result Date: 01/01/2021 CLINICAL DATA:  lower mid back pain for approx 3 weeks that comes and goes. Denies falling or injury, denies hx of back pain, denies urinary changes. EXAM: CT THORACIC AND LUMBAR SPINE WITHOUT CONTRAST TECHNIQUE: Multidetector CT imaging of the thoracic and lumbar spine was performed without contrast. Multiplanar CT image reconstructions were also generated. COMPARISON:  MRI lumbar spine 08/24/2009  FINDINGS: CT THORACIC SPINE FINDINGS Alignment: Normal. Vertebrae: Diffusely decreased bone density. Multilevel mild degenerative changes of the spine. Superior endplate T 7 Schmorl node. No acute fracture or focal pathologic process. Paraspinal and other soft tissues: Negative. Disc levels: Maintained. CT LUMBAR SPINE FINDINGS Segmentation: 5 lumbar type vertebrae. Alignment: Normal. Vertebrae: Diffusely decreased bone density. Chronic compression fracture of the L1 vertebral body with greater than 35% vertebral body height loss. Chronic compression fracture of the L2 vertebral body with greater than 45% height loss. No acute fracture or focal pathologic process. Paraspinal and other soft tissues: Negative. Disc levels: Maintained. IMPRESSION: CT THORACIC SPINE IMPRESSION No acute displaced fracture or traumatic listhesis of the thoracic spine. CT LUMBAR SPINE IMPRESSION No acute displaced fracture or traumatic listhesis of the lumbar spine in a patient with chronic grossly stable L1 and L2 compression fractures. Please see separately dictated CT chest, abdomen, pelvis 01/26/2021. Aortic Atherosclerosis (ICD10-I70.0) and Emphysema (ICD10-J43.9). Electronically Signed   By: Iven Finn M.D.   On: 01/01/2021  22:46   CT L-SPINE NO CHARGE  Result Date: 01/20/2021 CLINICAL DATA:  lower mid back pain for approx 3 weeks that comes and goes. Denies falling or injury, denies hx of back pain, denies urinary changes. EXAM: CT THORACIC AND LUMBAR SPINE WITHOUT CONTRAST TECHNIQUE: Multidetector CT imaging of the thoracic and lumbar spine was performed without contrast. Multiplanar CT image reconstructions were also generated. COMPARISON:  MRI lumbar spine 08/24/2009 FINDINGS: CT THORACIC SPINE FINDINGS Alignment: Normal. Vertebrae: Diffusely decreased bone density. Multilevel mild degenerative changes of the spine. Superior endplate T 7 Schmorl node. No acute fracture or focal pathologic process. Paraspinal and other  soft tissues: Negative. Disc levels: Maintained. CT LUMBAR SPINE FINDINGS Segmentation: 5 lumbar type vertebrae. Alignment: Normal. Vertebrae: Diffusely decreased bone density. Chronic compression fracture of the L1 vertebral body with greater than 35% vertebral body height loss. Chronic compression fracture of the L2 vertebral body with greater than 45% height loss. No acute fracture or focal pathologic process. Paraspinal and other soft tissues: Negative. Disc levels: Maintained. IMPRESSION: CT THORACIC SPINE IMPRESSION No acute displaced fracture or traumatic listhesis of the thoracic spine. CT LUMBAR SPINE IMPRESSION No acute displaced fracture or traumatic listhesis of the lumbar spine in a patient with chronic grossly stable L1 and L2 compression fractures. Please see separately dictated CT chest, abdomen, pelvis 01/11/2021. Aortic Atherosclerosis (ICD10-I70.0) and Emphysema (ICD10-J43.9). Electronically Signed   By: Iven Finn M.D.   On: 01/18/2021 22:46   DG Chest Port 1 View  Result Date: 01/21/2021 CLINICAL DATA:  Back pain for 3 weeks, initial encounter EXAM: PORTABLE CHEST 1 VIEW COMPARISON:  11/07/2019 FINDINGS: Postsurgical changes are again seen and stable. Cardiac shadow is mildly enlarged. Aortic calcifications are noted. Lungs are clear bilaterally. Mild vascular congestion is noted centrally. No bony abnormality is noted. IMPRESSION: Mild vascular congestion without other acute abnormality Electronically Signed   By: Inez Catalina M.D.   On: 01/06/2021 21:13   CT CHEST ABDOMEN PELVIS WO CONTRAST  Result Date: 01/22/2021 CLINICAL DATA:  Sepsis. Mid back pain for approx 3 weeks that comes and goes. Denies falling or injury, denies hx of back pain, denies urinary changes. EXAM: CT CHEST, ABDOMEN AND PELVIS WITHOUT CONTRAST TECHNIQUE: Multidetector CT imaging of the chest, abdomen and pelvis was performed following the standard protocol without IV contrast. COMPARISON:  CT abdomen pelvis  04/27/2012. FINDINGS: CT CHEST FINDINGS Cardiovascular: Prominent heart size. No significant pericardial effusion. The thoracic aorta is normal in caliber. Severe atherosclerotic plaque of the thoracic aorta. Aortic valve replacement. Four-vessel coronary artery calcifications status post coronary artery bypass graft. The main pulmonary artery is normal in caliber. Mediastinum/Nodes: No gross hilar adenopathy, noting limited sensitivity for the detection of hilar adenopathy on this noncontrast study. No enlarged mediastinal or axillary lymph nodes. Thyroid gland, trachea, and esophagus demonstrate no significant findings. Lungs/Pleura: Mild emphysematous changes. Left lower lobe atelectasis versus scarring. No focal consolidation. There is a new 1.2 cm ground-glass airspace opacity within the right upper lobe (4:45, 5:84). Few other scattered regions of less well-defined ground-glass airspace opacity within the right lung. No pulmonary mass. No pleural effusion. No pneumothorax. Musculoskeletal: No chest wall abnormality. No suspicious lytic or blastic osseous lesions. No acute displaced fracture. Please see separately dictated CT thoracolumbar spine 01/10/2021. CT ABDOMEN PELVIS FINDINGS Hepatobiliary: No focal liver abnormality. Calcified gallstone noted within the gallbladder lumen. No gallbladder wall thickening or pericholecystic fluid. A calcified stone is noted in the distal common bile duct measuring up to 3 mm (  5:70, 2:66). No biliary dilatation. Pancreas: No focal lesion. Normal pancreatic contour. No surrounding inflammatory changes. No main pancreatic ductal dilatation. Spleen: Normal in size without focal abnormality. Adrenals/Urinary Tract: No adrenal nodule bilaterally. Nonspecific bilateral perinephric stranding. Calcifications associated with the kidneys likely vascular. No nephrolithiasis and no hydronephrosis. No definite contour-deforming renal mass. No ureterolithiasis or hydroureter. The  urinary bladder is unremarkable. Stomach/Bowel: Stomach is within normal limits. No evidence of bowel wall thickening or dilatation. Diffuse colonic diverticulosis. Appendix appears normal. Vascular/Lymphatic: No abdominal aorta or iliac aneurysm. Severe atherosclerotic plaque of the aorta and its branches. No abdominal, pelvic, or inguinal lymphadenopathy. Reproductive: Prostate is unremarkable. Other: Trace free fluid and fat stranding within the left pelvis just anterior to the inguinal canal of unclear etiology (2:94, 5:67). Finding may be related to a small left inguinal hernia. No intraperitoneal free gas. No organized fluid collection. Musculoskeletal: No abdominal wall hernia or abnormality. No suspicious lytic or blastic osseous lesions. No acute displaced fracture. Please see separately dictated CT thoracolumbar spine 01/21/2021. IMPRESSION: 1. Interval development of a a 1.2 cm ground-glass airspace opacity within the right upper lobe as well as few other scattered regions of less well-defined ground-glass airspace opacity within the right lung. Findings represent infection/inflammation. Non-contrast chest CT at 3-6 months is recommended. If nodules persist, subsequent management will be based upon the most suspicious nodule(s). This recommendation follows the consensus statement: Guidelines for Management of Incidental Pulmonary Nodules Detected on CT Images: From the Fleischner Society 2017; Radiology 2017; 284:228-243. 2. Cholelithiasis as well as 3 mm distal common bile duct cholelithiasis. No associate CT findings of acute cholecystitis or biliary ductal dilatation. 3. Diffuse colonic diverticulosis with no acute diverticulitis. 4. Trace free fluid and fat stranding within the left pelvis just anterior to the inguinal canal of unclear etiology. Finding may be related to a small left inguinal hernia. Correlate with physical exam for inguinal hernia incarceration. Recommend attention on follow-up. 5.  Aortic Atherosclerosis (ICD10-I70.0) and Emphysema (ICD10-J43.9). 6. Please note markedly limited evaluation on this noncontrast study. 7. Please see separately dictated CT thoracolumbar spine 01/22/2021. Electronically Signed   By: Iven Finn M.D.   On: 01/23/2021 22:36      IMPRESSION AND PLAN:  Principal Problem:   Acute CHF (congestive heart failure) ([place])  1.  Acute on chronic systolic CHF. - The patient was admitted to a PCU bed. - We will continue diuresis with IV Lasix. - We will follow serial troponins. - 2D echo and cardiology consult to be obtained. - I notified Dr. Saralyn Pilar about the patient.  2.  Right-sided community-acquired pneumonia, likely bacterial.  The patient has subsequent sepsis is manifested by a temperature of 38.1 and respiratory rate of 26 as well as leukocytosis.  I do believe he has severe sepsis. - The patient will be placed on IV Rocephin and Zithromax. - We will follow blood and sputum culture. - Mucolytic therapy will be provided. - Bronchodilator therapy will be given.  3.  Acute hypoxic respiratory failure secondary to #1 and #2. - O2 protocol will be followed. - Management otherwise as above.  4.  Acute low midline back pain without radiculopathy. - Pain management will be provided. - UA was unremarkable. - Abdominal CT showed no acute etiology.  5.  Essential hypertension. - We will continue amlodipine and Zestril.  Renal functions are stable.  6.  Type 2 diabetes mellitus. - We will place the patient on supplement coverage with NovoLog and continue basal coverage. -  We will hold off metformin.  7.  GERD. - Continue PPI therapy.  DVT prophylaxis: Lovenox. Code Status: full code. Family Communication:  The plan of care was discussed in details with the patient (and family). I answered all questions. The patient agreed to proceed with the above mentioned plan. Further management will depend upon hospital course. Disposition Plan:  Back to previous home environment Consults called: Neurology. All the records are reviewed and case discussed with ED provider.  Status is: Inpatient   Remains inpatient appropriate because:Ongoing diagnostic testing needed not appropriate for outpatient work up, Unsafe d/c plan, IV treatments appropriate due to intensity of illness or inability to take PO, and Inpatient level of care appropriate due to severity of illness   Dispo: The patient is from: Home              Anticipated d/c is to: Home              Patient currently is not medically stable to d/c.              Difficult to place patient: No     Christel Mormon M.D on 01/18/2021 at 12:05 AM  [facility]   From 7 PM-7 AM, contact night-coverage [email or web address]  CC: Primary care physician; Ryan Pink, MD

## 2021-01-18 NOTE — Consult Note (Addendum)
CARDIOLOGY CONSULT NOTE               Patient ID: Ryan Mcclure MRN: [id number] DOB/AGE: 05/16/1931 87 y.o.  Admit date: 01/28/2021 Referring Physician Dr. Eugenie Norrie Primary Physician  Dr. Maryland Pink Primary Cardiologist Dr. Llana Aliment Reason for Consultation heart failure exacerbation  HPI: 87yoM with a PMH of HFpEF, ischemic cardiomyopathy, coronary artery disease s/p CABG, carotid artery atherosclerosis s/p carotid endarterectomy, CKD stage III, type 2 diabetes, CVA, dyslipidemia who presented to [facility] ED from [facility] on 01/19/2021 with a 2-day history of of SOB, weakness, and some DOE and confusion.  Cardiology was consulted for his heart failure exacerbation and questionable atrial fibrillation.  The patient states his shortness of breath states came on all of a sudden.  It admits to feeling "disoriented" prior to arrival to the hospital.  He admits to orthopnea, PND.  And normally sleeps at an incline.  He admits to compliance with his medications, and with low salt diet.  During interview patient feels like his heart is racing and feels associated chest pressure as a result.    Labs are significant for WBCs 13.7, H&H 9.5-30.3.  Platelets 125.   CT chest significant for 1.2 cm groundglass airspace opacity within the right upper lobe as well as few other scattered regions of less well-defined groundglass airspace opacity within the right lung.  Findings represent infection/inflammation.  Chest chest ray revealed mild vascular congestion without other acute abnormality.  Negative for pleural effusion.   Review of systems complete and found to be negative unless listed above     Past Medical History:  Diagnosis Date   Chronic systolic CHF (congestive heart failure) (HCC)    CKD (chronic kidney disease), stage III (HCC)    Diabetes (HCC)    HBP (high blood pressure)    Heart attack ([place])    High cholesterol    Stroke (cerebrum) ([place])    Swelling     Past  Surgical History:  Procedure Laterality Date   COLONOSCOPY N/A 10/21/2015   Procedure: COLONOSCOPY;  Surgeon: Milus Banister, MD;  Location: [facility] ENDOSCOPY;  Service: Endoscopy;  Laterality: N/A;   COLONOSCOPY WITH PROPOFOL N/A 12/28/2019   Procedure: COLONOSCOPY WITH PROPOFOL;  Surgeon: Lesly Rubenstein, MD;  Location: [facility] ENDOSCOPY;  Service: Endoscopy;  Laterality: N/A;   EXPLORATION POST OPERATIVE OPEN HEART     EYE SURGERY Bilateral     Medications Prior to Admission  Medication Sig Dispense Refill Last Dose   amLODipine (NORVASC) 5 MG tablet Take 5 mg by mouth 2 (two) times daily.   01/23/2021   atorvastatin (LIPITOR) 40 MG tablet Take 40 mg by mouth daily.   01/19/2021   clopidogrel (PLAVIX) 75 MG tablet Take 75 mg by mouth at bedtime.    01/07/2021   Cyanocobalamin 1000 MCG TBCR Take 1,000 mcg by mouth daily.    01/22/2021   ferrous sulfate 325 (65 FE) MG tablet Take 325 mg by mouth 3 (three) times daily with meals.    01/24/2021   fluticasone (FLONASE) 50 MCG/ACT nasal spray Place 1 spray into both nostrils daily as needed for allergies.    01/04/2021   furosemide (LASIX) 20 MG tablet Take 40 mg by mouth 2 (two) times daily.  10 01/01/2021   insulin aspart (NOVOLOG) 100 UNIT/ML FlexPen Inject 4-14 Units into the skin See admin instructions. Inject under the skin according to sliding scale: Breakfast (4u <100 or 8u >100) Lunch (6u <100 or 12u >  100) Supper (7u <100 or 14u >100)   01/23/2021   Insulin Glargine (BASAGLAR KWIKPEN) 100 UNIT/ML Inject 20 Units into the skin at bedtime.    01/16/2021   metoprolol tartrate (LOPRESSOR) 50 MG tablet TAKE 1 TABLET BY MOUTH TWICE DAILY   01/10/2021   omeprazole (PRILOSEC) 40 MG capsule Take 40 mg by mouth 2 (two) times daily.    01/05/2021   telmisartan (MICARDIS) 80 MG tablet Take 1 tablet by mouth daily.   01/06/2021   isosorbide mononitrate (IMDUR) 30 MG 24 hr tablet Take 30 mg by mouth daily. (Patient not taking: Reported on 01/26/2021)    Not Taking   metFORMIN (GLUCOPHAGE) 500 MG tablet Take 500 mg by mouth 2 (two) times daily. (Patient not taking: Reported on 12/29/2020)  2 Not Taking   nitroGLYCERIN (NITROSTAT) 0.4 MG SL tablet take 1 tablet under the tongue every 5 minutes if needed for chest pain   prn at prn   Social History   Socioeconomic History   Marital status: Widowed    Spouse name: Not on file   Number of children: Not on file   Years of education: Not on file   Highest education level: Not on file  Occupational History   Not on file  Tobacco Use   Smoking status: Former    Types: Cigarettes    Quit date: 01/30/1995    Years since quitting: 25.9   Smokeless tobacco: Never  Vaping Use   Vaping Use: Never used  Substance and Sexual Activity   Alcohol use: No   Drug use: No   Sexual activity: Not on file  Other Topics Concern   Not on file  Social History Narrative   Not on file   Social Determinants of Health   Financial Resource Strain: Not on file  Food Insecurity: Not on file  Transportation Needs: Not on file  Physical Activity: Not on file  Stress: Not on file  Social Connections: Not on file  Intimate Partner Violence: Not on file    Family History  Problem Relation Age of Onset   Diabetes Mellitus I Mother    Kidney failure Mother    Alcoholism Father    Heart attack Sister    Prostate cancer Brother       Review of systems complete and found to be negative unless listed above    PHYSICAL EXAM General: Elderly appearing black male, well nourished, in no acute distress. HEENT:  Normocephalic and atraumatic. Neck:  No JVD.  Lungs: Conversational dyspnea on oxygen by nasal cannula.  Poor air movement bilaterally auscultation.  Heart: HRRR . Normal S1 and S2 without gallops or murmurs. Radial & DP pulses 2+ bilaterally. Abdomen: Non-distended appearing.  Msk: Normal strength and tone for age. Extremities: No clubbing, cyanosis or edema.   Neuro: Alert and oriented X  3. Psych:  Mood appropriate, affect congruent.   Labs:   Lab Results  Component Value Date   WBC 13.7 (H) 01/18/2021   HGB 9.5 (L) 01/18/2021   HCT 30.3 (L) 01/18/2021   MCV 83.9 01/18/2021   PLT 125 (L) 01/18/2021    Recent Labs  Lab 01/11/2021 2051 01/18/21 0324  NA 138 138  K 3.5 3.5  CL 105 107  CO2 24 24  BUN 43* 45*  CREATININE 2.62* 2.67*  CALCIUM 9.4 8.9  PROT 8.2*  --   BILITOT 0.9  --   ALKPHOS 77  --   ALT 12  --   AST  17  --   GLUCOSE 134* 214*   Lab Results  Component Value Date   CKTOTAL 223 06/28/2012   CKMB 4.8 (H) 06/28/2012   TROPONINI 0.03 (HH) 08/15/2017    Lab Results  Component Value Date   CHOL 136 08/16/2017   CHOL 91 10/19/2015   CHOL 86 02/22/2011   Lab Results  Component Value Date   HDL 52 08/16/2017   HDL 29 (L) 10/19/2015   HDL 45 02/22/2011   Lab Results  Component Value Date   LDLCALC 70 08/16/2017   LDLCALC 41 10/19/2015   LDLCALC 26 02/22/2011   Lab Results  Component Value Date   TRIG 70 08/16/2017   TRIG 105 10/19/2015   TRIG 75 02/22/2011   Lab Results  Component Value Date   CHOLHDL 2.6 08/16/2017   CHOLHDL 3.1 10/19/2015   No results found for: LDLDIRECT    Radiology: CT T-SPINE NO CHARGE  Result Date: 01/19/2021 CLINICAL DATA:  lower mid back pain for approx 3 weeks that comes and goes. Denies falling or injury, denies hx of back pain, denies urinary changes. EXAM: CT THORACIC AND LUMBAR SPINE WITHOUT CONTRAST TECHNIQUE: Multidetector CT imaging of the thoracic and lumbar spine was performed without contrast. Multiplanar CT image reconstructions were also generated. COMPARISON:  MRI lumbar spine 08/24/2009 FINDINGS: CT THORACIC SPINE FINDINGS Alignment: Normal. Vertebrae: Diffusely decreased bone density. Multilevel mild degenerative changes of the spine. Superior endplate T 7 Schmorl node. No acute fracture or focal pathologic process. Paraspinal and other soft tissues: Negative. Disc levels: Maintained. CT  LUMBAR SPINE FINDINGS Segmentation: 5 lumbar type vertebrae. Alignment: Normal. Vertebrae: Diffusely decreased bone density. Chronic compression fracture of the L1 vertebral body with greater than 35% vertebral body height loss. Chronic compression fracture of the L2 vertebral body with greater than 45% height loss. No acute fracture or focal pathologic process. Paraspinal and other soft tissues: Negative. Disc levels: Maintained. IMPRESSION: CT THORACIC SPINE IMPRESSION No acute displaced fracture or traumatic listhesis of the thoracic spine. CT LUMBAR SPINE IMPRESSION No acute displaced fracture or traumatic listhesis of the lumbar spine in a patient with chronic grossly stable L1 and L2 compression fractures. Please see separately dictated CT chest, abdomen, pelvis 01/11/2021. Aortic Atherosclerosis (ICD10-I70.0) and Emphysema (ICD10-J43.9). Electronically Signed   By: Iven Finn M.D.   On: 01/18/2021 22:46   CT L-SPINE NO CHARGE  Result Date: 01/12/2021 CLINICAL DATA:  lower mid back pain for approx 3 weeks that comes and goes. Denies falling or injury, denies hx of back pain, denies urinary changes. EXAM: CT THORACIC AND LUMBAR SPINE WITHOUT CONTRAST TECHNIQUE: Multidetector CT imaging of the thoracic and lumbar spine was performed without contrast. Multiplanar CT image reconstructions were also generated. COMPARISON:  MRI lumbar spine 08/24/2009 FINDINGS: CT THORACIC SPINE FINDINGS Alignment: Normal. Vertebrae: Diffusely decreased bone density. Multilevel mild degenerative changes of the spine. Superior endplate T 7 Schmorl node. No acute fracture or focal pathologic process. Paraspinal and other soft tissues: Negative. Disc levels: Maintained. CT LUMBAR SPINE FINDINGS Segmentation: 5 lumbar type vertebrae. Alignment: Normal. Vertebrae: Diffusely decreased bone density. Chronic compression fracture of the L1 vertebral body with greater than 35% vertebral body height loss. Chronic compression fracture  of the L2 vertebral body with greater than 45% height loss. No acute fracture or focal pathologic process. Paraspinal and other soft tissues: Negative. Disc levels: Maintained. IMPRESSION: CT THORACIC SPINE IMPRESSION No acute displaced fracture or traumatic listhesis of the thoracic spine. CT LUMBAR SPINE IMPRESSION No acute  displaced fracture or traumatic listhesis of the lumbar spine in a patient with chronic grossly stable L1 and L2 compression fractures. Please see separately dictated CT chest, abdomen, pelvis 01/18/2021. Aortic Atherosclerosis (ICD10-I70.0) and Emphysema (ICD10-J43.9). Electronically Signed   By: Iven Finn M.D.   On: 01/10/2021 22:46   DG Chest Port 1 View  Result Date: 01/25/2021 CLINICAL DATA:  Back pain for 3 weeks, initial encounter EXAM: PORTABLE CHEST 1 VIEW COMPARISON:  11/07/2019 FINDINGS: Postsurgical changes are again seen and stable. Cardiac shadow is mildly enlarged. Aortic calcifications are noted. Lungs are clear bilaterally. Mild vascular congestion is noted centrally. No bony abnormality is noted. IMPRESSION: Mild vascular congestion without other acute abnormality Electronically Signed   By: Inez Catalina M.D.   On: 01/19/2021 21:13   CT CHEST ABDOMEN PELVIS WO CONTRAST  Result Date: 01/28/2021 CLINICAL DATA:  Sepsis. Mid back pain for approx 3 weeks that comes and goes. Denies falling or injury, denies hx of back pain, denies urinary changes. EXAM: CT CHEST, ABDOMEN AND PELVIS WITHOUT CONTRAST TECHNIQUE: Multidetector CT imaging of the chest, abdomen and pelvis was performed following the standard protocol without IV contrast. COMPARISON:  CT abdomen pelvis 04/27/2012. FINDINGS: CT CHEST FINDINGS Cardiovascular: Prominent heart size. No significant pericardial effusion. The thoracic aorta is normal in caliber. Severe atherosclerotic plaque of the thoracic aorta. Aortic valve replacement. Four-vessel coronary artery calcifications status post coronary artery  bypass graft. The main pulmonary artery is normal in caliber. Mediastinum/Nodes: No gross hilar adenopathy, noting limited sensitivity for the detection of hilar adenopathy on this noncontrast study. No enlarged mediastinal or axillary lymph nodes. Thyroid gland, trachea, and esophagus demonstrate no significant findings. Lungs/Pleura: Mild emphysematous changes. Left lower lobe atelectasis versus scarring. No focal consolidation. There is a new 1.2 cm ground-glass airspace opacity within the right upper lobe (4:45, 5:84). Few other scattered regions of less well-defined ground-glass airspace opacity within the right lung. No pulmonary mass. No pleural effusion. No pneumothorax. Musculoskeletal: No chest wall abnormality. No suspicious lytic or blastic osseous lesions. No acute displaced fracture. Please see separately dictated CT thoracolumbar spine 01/08/2021. CT ABDOMEN PELVIS FINDINGS Hepatobiliary: No focal liver abnormality. Calcified gallstone noted within the gallbladder lumen. No gallbladder wall thickening or pericholecystic fluid. A calcified stone is noted in the distal common bile duct measuring up to 3 mm (5:70, 2:66). No biliary dilatation. Pancreas: No focal lesion. Normal pancreatic contour. No surrounding inflammatory changes. No main pancreatic ductal dilatation. Spleen: Normal in size without focal abnormality. Adrenals/Urinary Tract: No adrenal nodule bilaterally. Nonspecific bilateral perinephric stranding. Calcifications associated with the kidneys likely vascular. No nephrolithiasis and no hydronephrosis. No definite contour-deforming renal mass. No ureterolithiasis or hydroureter. The urinary bladder is unremarkable. Stomach/Bowel: Stomach is within normal limits. No evidence of bowel wall thickening or dilatation. Diffuse colonic diverticulosis. Appendix appears normal. Vascular/Lymphatic: No abdominal aorta or iliac aneurysm. Severe atherosclerotic plaque of the aorta and its branches. No  abdominal, pelvic, or inguinal lymphadenopathy. Reproductive: Prostate is unremarkable. Other: Trace free fluid and fat stranding within the left pelvis just anterior to the inguinal canal of unclear etiology (2:94, 5:67). Finding may be related to a small left inguinal hernia. No intraperitoneal free gas. No organized fluid collection. Musculoskeletal: No abdominal wall hernia or abnormality. No suspicious lytic or blastic osseous lesions. No acute displaced fracture. Please see separately dictated CT thoracolumbar spine 12/29/2020. IMPRESSION: 1. Interval development of a a 1.2 cm ground-glass airspace opacity within the right upper lobe as well as few other  scattered regions of less well-defined ground-glass airspace opacity within the right lung. Findings represent infection/inflammation. Non-contrast chest CT at 3-6 months is recommended. If nodules persist, subsequent management will be based upon the most suspicious nodule(s). This recommendation follows the consensus statement: Guidelines for Management of Incidental Pulmonary Nodules Detected on CT Images: From the Fleischner Society 2017; Radiology 2017; 284:228-243. 2. Cholelithiasis as well as 3 mm distal common bile duct cholelithiasis. No associate CT findings of acute cholecystitis or biliary ductal dilatation. 3. Diffuse colonic diverticulosis with no acute diverticulitis. 4. Trace free fluid and fat stranding within the left pelvis just anterior to the inguinal canal of unclear etiology. Finding may be related to a small left inguinal hernia. Correlate with physical exam for inguinal hernia incarceration. Recommend attention on follow-up. 5. Aortic Atherosclerosis (ICD10-I70.0) and Emphysema (ICD10-J43.9). 6. Please note markedly limited evaluation on this noncontrast study. 7. Please see separately dictated CT thoracolumbar spine 01/22/2021. Electronically Signed   By: Iven Finn M.D.   On: 01/28/2021 22:36    ECHO 06/11/2018 NORMAL LEFT  VENTRICULAR SYSTOLIC FUNCTION   WITH MILD LVH  NORMAL RIGHT VENTRICULAR SYSTOLIC FUNCTION  MILD VALVULAR REGURGITATION (See above)  NO VALVULAR STENOSIS  MILD TR, PR  TRIVIAL MR  EF 55%   TELEMETRY reviewed by me: Sinus tachycardia with ventricular rate of 110 bpm.  EKG reviewed by me and Dr. Saralyn Pilar: 01/02/2021 at 2111: Sinus rhythm with RBBB, PVC and significant amount of artifact which does not appear to be atrial fibrillation  ASSESSMENT AND PLAN:  60yoM with a PMH of HFpEF, ischemic cardiomyopathy, coronary artery disease s/p CABG, carotid artery atherosclerosis s/p carotid endarterectomy, hypertension, CKD stage III, type 2 diabetes, CVA, dyslipidemia who presented to [facility] ED from [facility] on 01/24/2021 with a 2-day history of of SOB, weakness, and some DOE and confusion.  Cardiology was consulted for his heart failure exacerbation and questionable atrial fibrillation.  #Abnormal EKG Upon my personal review and review with Dr. Isaias Cowman the patient does not appear to be in atrial fibrillation and therefore will not require anticoagulation at this time.  His initial EKG revealed sinus rhythm with RBBB and a PVC with significant amounts of artifact. Will continue to monitor on telemetry   #Acute on chronic HFpEF (2020 EF 55-60%) #ischemic cardiomyopathy Continue diuresis with IV Lasix 40 mg IV every 12 Increase metoprolol to 75 mg twice daily for better heart rate control.  Hold parameters in place for blood pressure As needed nitroglycerin for chest pain Encouraged low-salt diet and monitor I's and O's Echocardiogram ordered and will follow results  #hypertension #coronary artery disease s/p CABG Continue amlodipine 5 mg twice daily Continue plavix 75mg  once nightly, platelets today 125. Will continue to monitor. Lovenox for DVT prophylaxis  #CKD stage 3 Monitor renal function and electrolytes Creatinine appears to be at baseline  #Diabetes SSI per  hospitalist  #dyslipidemia Continue high intensity statin Lipitor 40 mg  #Acute hypoxic respiratory failure 2/2 Community-acquired pneumonia Antibiotic therapy and supportive care as per hospitalist. Continue supportive care and antibiotic therapy as per hospitalist  This patient was seen and examined by Dr. Isaias Cowman and he is in agreement with this plan of care.   Signed: Tristan Schroeder , PA-C 01/18/2021, 8:45 AM

## 2021-01-19 DIAGNOSIS — I50811 Acute right heart failure: Secondary | ICD-10-CM | POA: Diagnosis not present

## 2021-01-19 LAB — CBC WITH DIFFERENTIAL/PLATELET
Abs Immature Granulocytes: 0.06 10*3/uL (ref 0.00–0.07)
Basophils Absolute: 0 10*3/uL (ref 0.0–0.1)
Basophils Relative: 0 %
Eosinophils Absolute: 0.1 10*3/uL (ref 0.0–0.5)
Eosinophils Relative: 1 %
HCT: 25.7 % — ABNORMAL LOW (ref 39.0–52.0)
Hemoglobin: 8.2 g/dL — ABNORMAL LOW (ref 13.0–17.0)
Immature Granulocytes: 1 %
Lymphocytes Relative: 7 %
Lymphs Abs: 0.9 10*3/uL (ref 0.7–4.0)
MCH: 26.6 pg (ref 26.0–34.0)
MCHC: 31.9 g/dL (ref 30.0–36.0)
MCV: 83.4 fL (ref 80.0–100.0)
Monocytes Absolute: 1.5 10*3/uL — ABNORMAL HIGH (ref 0.1–1.0)
Monocytes Relative: 11 %
Neutro Abs: 10.6 10*3/uL — ABNORMAL HIGH (ref 1.7–7.7)
Neutrophils Relative %: 80 %
Platelets: 119 10*3/uL — ABNORMAL LOW (ref 150–400)
RBC: 3.08 MIL/uL — ABNORMAL LOW (ref 4.22–5.81)
RDW: 14.4 % (ref 11.5–15.5)
Smear Review: NORMAL
WBC: 13.2 10*3/uL — ABNORMAL HIGH (ref 4.0–10.5)
nRBC: 0 % (ref 0.0–0.2)

## 2021-01-19 LAB — ECHOCARDIOGRAM COMPLETE
AR max vel: 3.01 cm2
AV Area VTI: 2.91 cm2
AV Area mean vel: 2.7 cm2
AV Mean grad: 14.5 mmHg
AV Peak grad: 25.2 mmHg
Ao pk vel: 2.51 m/s
Area-P 1/2: 3.02 cm2
Height: 67 in
MV VTI: 2.64 cm2
S' Lateral: 3.09 cm
Weight: 2645.52 oz

## 2021-01-19 LAB — HEMOGLOBIN A1C
Hgb A1c MFr Bld: 6.4 % — ABNORMAL HIGH (ref 4.8–5.6)
Mean Plasma Glucose: 137 mg/dL

## 2021-01-19 LAB — URINE CULTURE

## 2021-01-19 LAB — COMPREHENSIVE METABOLIC PANEL
ALT: 12 U/L (ref 0–44)
AST: 16 U/L (ref 15–41)
Albumin: 2.7 g/dL — ABNORMAL LOW (ref 3.5–5.0)
Alkaline Phosphatase: 59 U/L (ref 38–126)
Anion gap: 7 (ref 5–15)
BUN: 46 mg/dL — ABNORMAL HIGH (ref 8–23)
CO2: 23 mmol/L (ref 22–32)
Calcium: 8.8 mg/dL — ABNORMAL LOW (ref 8.9–10.3)
Chloride: 108 mmol/L (ref 98–111)
Creatinine, Ser: 2.99 mg/dL — ABNORMAL HIGH (ref 0.61–1.24)
GFR, Estimated: 20 mL/min — ABNORMAL LOW (ref >60)
Glucose, Bld: 211 mg/dL — ABNORMAL HIGH (ref 70–99)
Potassium: 3.4 mmol/L — ABNORMAL LOW (ref 3.5–5.1)
Sodium: 138 mmol/L (ref 135–145)
Total Bilirubin: 0.8 mg/dL (ref 0.3–1.2)
Total Protein: 6.5 g/dL (ref 6.5–8.1)

## 2021-01-19 LAB — GLUCOSE, CAPILLARY
Glucose-Capillary: 204 mg/dL — ABNORMAL HIGH (ref 70–99)
Glucose-Capillary: 209 mg/dL — ABNORMAL HIGH (ref 70–99)
Glucose-Capillary: 117 mg/dL — ABNORMAL HIGH (ref 70–99)
Glucose-Capillary: 165 mg/dL — ABNORMAL HIGH (ref 70–99)

## 2021-01-19 LAB — BRAIN NATRIURETIC PEPTIDE: B Natriuretic Peptide: 1182.8 pg/mL — ABNORMAL HIGH (ref 0.0–100.0)

## 2021-01-19 LAB — MAGNESIUM: Magnesium: 1.6 mg/dL — ABNORMAL LOW (ref 1.7–2.4)

## 2021-01-19 MED ORDER — INSULIN GLARGINE-YFGN 100 UNIT/ML ~~LOC~~ SOLN
24.0000 [IU] | Freq: Every day | SUBCUTANEOUS | Status: DC
  Administered 2021-01-19 – 2021-01-25 (×7): 24 [IU] via SUBCUTANEOUS
  Filled 2021-01-19 (×9): qty 0.24

## 2021-01-19 MED ORDER — MAGNESIUM OXIDE -MG SUPPLEMENT 400 (240 MG) MG PO TABS
400.0000 mg | ORAL_TABLET | Freq: Two times a day (BID) | ORAL | Status: DC
Start: 2021-01-19 — End: 2021-01-20
  Administered 2021-01-19 (×2): 400 mg via ORAL
  Filled 2021-01-19 (×2): qty 1

## 2021-01-19 MED ORDER — FUROSEMIDE 10 MG/ML IJ SOLN
20.0000 mg | Freq: Two times a day (BID) | INTRAMUSCULAR | Status: DC
  Administered 2021-01-19 – 2021-01-20 (×2): 20 mg via INTRAVENOUS
  Filled 2021-01-19 (×2): qty 2

## 2021-01-19 NOTE — Evaluation (Signed)
Clinical/Bedside Swallow Evaluation Patient Details  Name: Ryan Mcclure MRN: [id number] Date of Birth: 09-Jun-1931  Today's Date: 01/19/2021 Time: SLP Start Time (ACUTE ONLY): 0933 SLP Stop Time (ACUTE ONLY): 1000 SLP Time Calculation (min) (ACUTE ONLY): 27 min  Past Medical History:  Past Medical History:  Diagnosis Date   Chronic systolic CHF (congestive heart failure) (HCC)    CKD (chronic kidney disease), stage III (HCC)    Diabetes ([place])    HBP (high blood pressure)    Heart attack ([place])    High cholesterol    Stroke (cerebrum) ([place])    Swelling    Past Surgical History:  Past Surgical History:  Procedure Laterality Date   COLONOSCOPY N/A 10/21/2015   Procedure: COLONOSCOPY;  Surgeon: Milus Banister, MD;  Location: [facility];  Service: Endoscopy;  Laterality: N/A;   COLONOSCOPY WITH PROPOFOL N/A 12/28/2019   Procedure: COLONOSCOPY WITH PROPOFOL;  Surgeon: Lesly Rubenstein, MD;  Location: [facility] ENDOSCOPY;  Service: Endoscopy;  Laterality: N/A;   EXPLORATION POST OPERATIVE OPEN HEART     EYE SURGERY Bilateral    HPI:  Per [id number] H&P "Ryan Mcclure is a 87 y.o. African-American male with medical history significant for systolic CHF, stage III chronic kidney disease, type 2 diabetes mellitus, CVA, GERD and dyslipidemia, presented to the ER with acute onset of low back pain for the last couple of days with radiculopathy.  He admitted to dry cough as well as dyspnea and wheezing lately with no fever or chills.  He denied any nausea or vomiting or abdominal pain.  He has been also having worsening lower extremity edema as well as dyspnea on exertion, orthopnea and paroxysmal nocturnal dyspnea.  No dysuria, oliguria or hematuria or flank pain.     ED Course: Upon presentation to the emergency room temperature was 100.5 with a blood pressure 173/61 with pulse oximetry  86% on room air that came up to 99% on 2.5 L O2 via nasal cannula.  Labs reviewed borderline potassium of 3.5  BUN of 43 with a creatinine of 2.60 close to previous levels and June of this year..  I sensitive troponin I was 45 and lactic acid 1.5.  CBC showed leukocytosis of 14.3 with neutrophilia and anemia.  INR is 1 and PT 13.6.  PTT was 39.  Influenza antigens and COVID-19 PCR came back negative.     EKG as reviewed by me : EKG showed atrial fibrillation with controlled ventricular sponsor of 90 with PVCs, right bundle branch block left anterior fascicular block.  Imaging: Chest x-ray showed mild vascular congestion without other abnormalities.  Chest abdomen CT without contrast revealed the following  1. Interval development of a a 1.2 cm ground-glass airspace opacity  within the right upper lobe as well as few other scattered regions  of less well-defined ground-glass airspace opacity within the right  lung. Findings represent infection/inflammation. Non-contrast chest  CT at 3-6 months is recommended..."    Assessment / Plan / Recommendation  Clinical Impression  Pt seen for clinical swallowing evaluation. Pt alert, pleasant, and cooperative. Received upright in recliner. Consuming breakfast. Subtle throat clearing noted prior to observed POs. Pt attributing to "reflux."  Pt currently on 2L/min O2 via [place]. Temp WNL. WBC trending downward. Chest CT 12/20 "Pt alert, pleasant, and cooperative. Consuming breakfast. Subtle throat clearing noted prior to observed POs. Pt attributing to "reflux."Chest CT 12/20 "Interval development of a a 1.2 cm ground-glass airspace opacity within the right upper lobe as well  as few other scattered regions of less well-defined ground-glass airspace opacity within the right lung. "  Oral motor examination significant for mildly reduced lingual strength bilaterally with lingual tremor with lateralization. Otherwise, unremarkable.   Pt demonstrated a grossly functional oral swallow with trace lingual residual noted with egg which cleared with unprompted sips of of liquid wash. No  overt s/sx pharyngeal dysphagia noted; however, pt with intermittent subtle throat clearing consistent with presentation when SLP entered room. Pt also with x2 instances of belching. Concern for possible esophageal dysphagia. Pt denies globus sensation or other esophageal complaints during evaluation. Noted pt with hx of GIB in 2021 as well as GERD. Pt taking Protonix this hospitalization.   Observed pt taking pills with water from RN - no observed difficulty or subjective difficulty reported.   Recommend continuation of regular diet and thin liquids. Addition of gravies, sauces, condiments to moisten food. Safe swallowing strategies/aspiration and reflux precautions as outlined below.   Pt is at a mildly increased risk for aspiration/aspiration PNA given multiple medical comorbidites and hx of CVA.   Reviewed diet recommendations, safe swallowing strategies/aspiration precautions, reflux precautions, and SLP POC with pt. Pt verbalized understanding agreement. Communication board in pt's room updated with diet recommendations, safe swallowing strategies/aspiration precautions, and SLP POC. RN updated.   SLP to f/u x1 for diet tolerance.   SLP Visit Diagnosis: Dysphagia, pharyngoesophageal phase (R13.14);Dysphagia, oral phase (R13.11)    Aspiration Risk  Mild aspiration risk    Diet Recommendation Regular;Thin liquid (extra gravies, sauces, condiments)   Medication Administration:  (x1 pill at a time with water or puree as tolerated) Supervision: Patient able to self feed (set up; initial cues for safe swallowing strategies/reflux precautions) Compensations: Minimize environmental distractions;Slow rate;Small sips/bites;Follow solids with liquid (REFLUX precautions) Postural Changes: Seated upright at 90 degrees;Remain upright for at least 30 minutes after po intake    Other  Recommendations Oral Care Recommendations: Oral care BID;Oral care before and after PO    Recommendations for follow  up therapy are one component of a multi-disciplinary discharge planning process, led by the attending physician.  Recommendations may be updated based on patient status, additional functional criteria and insurance authorization.  Follow up Recommendations Skilled nursing-short term rehab (<3 hours/day)      Assistance Recommended at Discharge Intermittent Supervision/Assistance  Functional Status Assessment Patient has had a recent decline in their functional status and demonstrates the ability to make significant improvements in function in a reasonable and predictable amount of time.  Frequency and Duration min 2x/week  2 weeks;1 week       Prognosis Prognosis for Safe Diet Advancement: Fair Barriers to Reach Goals:  (comorbidities)      Swallow Study   General Date of Onset: 01/16/2021 HPI: Per [id number] H&P "ALTARIQ GOODALL is a 87 y.o. African-American male with medical history significant for systolic CHF, stage III chronic kidney disease, type 2 diabetes mellitus, CVA, GERD and dyslipidemia, presented to the ER with acute onset of low back pain for the last couple of days with radiculopathy.  He admitted to dry cough as well as dyspnea and wheezing lately with no fever or chills.  He denied any nausea or vomiting or abdominal pain.  He has been also having worsening lower extremity edema as well as dyspnea on exertion, orthopnea and paroxysmal nocturnal dyspnea.  No dysuria, oliguria or hematuria or flank pain.     ED Course: Upon presentation to the emergency room temperature was 100.5 with a blood  pressure 173/61 with pulse oximetry  86% on room air that came up to 99% on 2.5 L O2 via nasal cannula.  Labs reviewed borderline potassium of 3.5 BUN of 43 with a creatinine of 2.60 close to previous levels and June of this year..  I sensitive troponin I was 45 and lactic acid 1.5.  CBC showed leukocytosis of 14.3 with neutrophilia and anemia.  INR is 1 and PT 13.6.  PTT was 39.  Influenza  antigens and COVID-19 PCR came back negative.     EKG as reviewed by me : EKG showed atrial fibrillation with controlled ventricular sponsor of 90 with PVCs, right bundle branch block left anterior fascicular block.  Imaging: Chest x-ray showed mild vascular congestion without other abnormalities.  Chest abdomen CT without contrast revealed the following  1. Interval development of a a 1.2 cm ground-glass airspace opacity  within the right upper lobe as well as few other scattered regions  of less well-defined ground-glass airspace opacity within the right  lung. Findings represent infection/inflammation. Non-contrast chest  CT at 3-6 months is recommended..." Type of Study: Bedside Swallow Evaluation Previous Swallow Assessment: unknown Diet Prior to this Study: Regular;Thin liquids Temperature Spikes Noted: No Respiratory Status: Nasal cannula (2L/min) History of Recent Intubation: No Behavior/Cognition: Alert;Cooperative;Pleasant mood (slow to respond at times) Oral Cavity Assessment: Within Functional Limits Oral Care Completed by SLP: Recent completion by staff Oral Cavity - Dentition: Adequate natural dentition Vision: Functional for self-feeding Self-Feeding Abilities: Able to feed self (set up assistance) Patient Positioning: Upright in chair Baseline Vocal Quality: Normal Volitional Cough: Strong Volitional Swallow: Able to elicit    Oral/Motor/Sensory Function Overall Oral Motor/Sensory Function: Mild impairment Facial ROM: Within Functional Limits Facial Symmetry: Within Functional Limits Facial Strength: Within Functional Limits Facial Sensation: Within Functional Limits Lingual ROM: Within Functional Limits Lingual Symmetry: Within Functional Limits Lingual Strength:  (lingual tremor with lateralization; mildly reduced strength bilaterally) Velum: Within Functional Limits Mandible: Within Functional Limits   Thin Liquid Thin Liquid: Within functional limits Presentation:  Cup;Straw;Self Fed Other Comments: ~6 oz of thin liquds; water and coffee    Puree Puree: Within functional limits Presentation: Self Fed Other Comments: ~2 oz grits   Solid     Solid: Impaired Oral Phase Impairments: Other (comment) (lingual weakness) Oral Phase Functional Implications: Oral residue (trace with eggs) Other Comments: approx half of the eggs on meal tray     Cherrie Gauze, M.S., [facility] [phone] (ASCOM)  Clearnce Sorrel Willson Lipa 01/19/2021,1:31 PM

## 2021-01-19 NOTE — Progress Notes (Signed)
PROGRESS NOTE   Ryan Mcclure  BZJ:[id number] DOB: 05/02/1931 DOA: 12/29/2020 PCP: [place] Pink, MD  Brief Narrative:  87 year old white male [place] resident HFrEF, CKD 4, DM TY 2, reflux, HLD, CVA with carotid endarterectomy, AoV replacement bioprosthetic valve CABG and stent August 2012 followed at [facility] Admission for GI bleed 12/25/2019-5 mm polyp present at that time-prior GI bleed 2017   Presented 12/22 [facility] ED low back pain in the setting of mild cough X 3 weeks-found to have oxygen 87% on room air and started on oxygen, T-max 100.5 mildly hypotensive CXR 12/20 mild vascular congestion CT chest atelectasis lung bases no focal pneumonia no effusion Sodium 138 BUN/creatinine 45/2.6 (baseline 56/2.66), BNP 1351, troponin 45 White count 14.3-->13.7 hemoglobin 10.6-->9.5   Code sepsis called concerning admission for acute superimposed on chronic systolic heart failure in addition to probable right-sided community-acquired pneumonia versus aspiration pneumonia    Hospital-Problem based course  Multifactorial respiratory failure Right-sided pneumonia Continue for now ceftriaxone and azithromycin SLP to see patient to ensure no aspiration component Wean oxygen as tolerated Acute decompensated heart failure EF 45-50% on 12/21 Atrial valve bioprosthetic replacement, systemic 08/2010 I/os probably inaccurate--enforce fluid restriction Usual baseline weight seems to be about 95 kg in the outpatient setting--we will ask for standing weights adjust Lasix IV form 40-->20 IV twice daily Myocardis 80 held, Imdur 30  held Continue Plavix 75 daily, atorvastatin 40 PVCs/SVT Not felt to be afib--on monitors today predominant NSR with PVC Cardiology recommend up titration Lopressor 75 twice daily Replace  magnesium 400 twice daily Prior GI bleed 2021, 2017 secondary to polyps Clinically stable at this time-continue Protonix 40 daily Diabetes mellitus type 2-usually on metformin + insulin  at home-A1c was 6.4 CBGs 196-229 50% of meals, increase Lantus to 24 units CKD 4 Will adjust downward to Lasix Repeat labs in a.m. HTN Continue amlodipine 5 twice daily Right upper lobe nodule 1.2 cm Requires outpatient CT in 3 to 6 months   DVT prophylaxis: lovenox Code Status: Full Family Communication: none present bedside Disposition:  Status is: Inpatient  Remains inpatient appropriate because: still on abx and need freq labs     Consultants:  Cardiology   Procedures: n  Antimicrobials:   Azithromycin and ceftriaxone from 12/21  Subjective:  Looks well overall is feeling fair Has no distress no chest pain no fever Eating and drinking Set up in the chair yesterday  he slept well overnight  Objective: Vitals:   01/19/21 0009 01/19/21 0351 01/19/21 0400 01/19/21 0716  BP: (!) 141/56 (!) 145/52  (!) 136/56  Pulse: 69 78  76  Resp: 18 19  20   Temp: 98.5 F (36.9 C) 98.5 F (36.9 C)  98.7 F (37.1 C)  TempSrc:  Oral  Oral  SpO2: 94% 94%  96%  Weight:   75.4 kg   Height:        Intake/Output Summary (Last 24 hours) at 01/19/2021 0853 Last data filed at 01/19/2021 0600 Gross per 24 hour  Intake 1410.09 ml  Output 1375 ml  Net 35.09 ml   Filed Weights   12/29/2020 2045 01/18/21 0100 01/19/21 0400  Weight: 90.7 kg 75 kg 75.4 kg    Examination:  EOMI NCAT no focal deficit No JVD noted at bedside S1-S2 PVCs on monitors Patient denies mild crackles on the right posterolateral lung fields Abdomen is soft nontender Looks a little swollen 1 + edema which is improved from prior Neurologically intact  Data Reviewed: personally reviewed  CBC    Component Value Date/Time   WBC 13.2 (H) 01/19/2021 0636   RBC 3.08 (L) 01/19/2021 0636   HGB 8.2 (L) 01/19/2021 0636   HGB 11.6 (L) 10/10/2012 0830   HCT 25.7 (L) 01/19/2021 0636   HCT 35.7 (L) 10/10/2012 0830   PLT 119 (L) 01/19/2021 0636   PLT 108 (L) 10/10/2012 0830   MCV 83.4 01/19/2021 0636   MCV  79 (L) 10/10/2012 0830   MCH 26.6 01/19/2021 0636   MCHC 31.9 01/19/2021 0636   RDW 14.4 01/19/2021 0636   RDW 15.5 (H) 10/10/2012 0830   LYMPHSABS 0.9 01/19/2021 0636   LYMPHSABS 1.5 10/10/2012 0830   MONOABS 1.5 (H) 01/19/2021 0636   MONOABS 0.9 10/10/2012 0830   EOSABS 0.1 01/19/2021 0636   EOSABS 0.3 10/10/2012 0830   BASOSABS 0.0 01/19/2021 0636   BASOSABS 0.1 10/10/2012 0830   CMP Latest Ref Rng & Units 01/19/2021 01/18/2021 01/18/2021  Glucose 70 - 99 mg/dL 211(H) 214(H) 134(H)  BUN 8 - 23 mg/dL 46(H) 45(H) 43(H)  Creatinine 0.61 - 1.24 mg/dL 2.99(H) 2.67(H) 2.62(H)  Sodium 135 - 145 mmol/L 138 138 138  Potassium 3.5 - 5.1 mmol/L 3.4(L) 3.5 3.5  Chloride 98 - 111 mmol/L 108 107 105  CO2 22 - 32 mmol/L 23 24 24   Calcium 8.9 - 10.3 mg/dL 8.8(L) 8.9 9.4  Total Protein 6.5 - 8.1 g/dL 6.5 - 8.2(H)  Total Bilirubin 0.3 - 1.2 mg/dL 0.8 - 0.9  Alkaline Phos 38 - 126 U/L 59 - 77  AST 15 - 41 U/L 16 - 17  ALT 0 - 44 U/L 12 - 12     Radiology Studies: CT T-SPINE NO CHARGE  Result Date: 12/29/2020 CLINICAL DATA:  lower mid back pain for approx 3 weeks that comes and goes. Denies falling or injury, denies hx of back pain, denies urinary changes. EXAM: CT THORACIC AND LUMBAR SPINE WITHOUT CONTRAST TECHNIQUE: Multidetector CT imaging of the thoracic and lumbar spine was performed without contrast. Multiplanar CT image reconstructions were also generated. COMPARISON:  MRI lumbar spine 08/24/2009 FINDINGS: CT THORACIC SPINE FINDINGS Alignment: Normal. Vertebrae: Diffusely decreased bone density. Multilevel mild degenerative changes of the spine. Superior endplate T 7 Schmorl node. No acute fracture or focal pathologic process. Paraspinal and other soft tissues: Negative. Disc levels: Maintained. CT LUMBAR SPINE FINDINGS Segmentation: 5 lumbar type vertebrae. Alignment: Normal. Vertebrae: Diffusely decreased bone density. Chronic compression fracture of the L1 vertebral body with greater than  35% vertebral body height loss. Chronic compression fracture of the L2 vertebral body with greater than 45% height loss. No acute fracture or focal pathologic process. Paraspinal and other soft tissues: Negative. Disc levels: Maintained. IMPRESSION: CT THORACIC SPINE IMPRESSION No acute displaced fracture or traumatic listhesis of the thoracic spine. CT LUMBAR SPINE IMPRESSION No acute displaced fracture or traumatic listhesis of the lumbar spine in a patient with chronic grossly stable L1 and L2 compression fractures. Please see separately dictated CT chest, abdomen, pelvis 01/21/2021. Aortic Atherosclerosis (ICD10-I70.0) and Emphysema (ICD10-J43.9). Electronically Signed   By: Iven Finn M.D.   On: 01/05/2021 22:46   CT L-SPINE NO CHARGE  Result Date: 01/22/2021 CLINICAL DATA:  lower mid back pain for approx 3 weeks that comes and goes. Denies falling or injury, denies hx of back pain, denies urinary changes. EXAM: CT THORACIC AND LUMBAR SPINE WITHOUT CONTRAST TECHNIQUE: Multidetector CT imaging of the thoracic and lumbar spine was performed without contrast. Multiplanar CT image reconstructions were also generated.  COMPARISON:  MRI lumbar spine 08/24/2009 FINDINGS: CT THORACIC SPINE FINDINGS Alignment: Normal. Vertebrae: Diffusely decreased bone density. Multilevel mild degenerative changes of the spine. Superior endplate T 7 Schmorl node. No acute fracture or focal pathologic process. Paraspinal and other soft tissues: Negative. Disc levels: Maintained. CT LUMBAR SPINE FINDINGS Segmentation: 5 lumbar type vertebrae. Alignment: Normal. Vertebrae: Diffusely decreased bone density. Chronic compression fracture of the L1 vertebral body with greater than 35% vertebral body height loss. Chronic compression fracture of the L2 vertebral body with greater than 45% height loss. No acute fracture or focal pathologic process. Paraspinal and other soft tissues: Negative. Disc levels: Maintained. IMPRESSION: CT  THORACIC SPINE IMPRESSION No acute displaced fracture or traumatic listhesis of the thoracic spine. CT LUMBAR SPINE IMPRESSION No acute displaced fracture or traumatic listhesis of the lumbar spine in a patient with chronic grossly stable L1 and L2 compression fractures. Please see separately dictated CT chest, abdomen, pelvis 01/08/2021. Aortic Atherosclerosis (ICD10-I70.0) and Emphysema (ICD10-J43.9). Electronically Signed   By: Iven Finn M.D.   On: 01/26/2021 22:46   DG Chest Port 1 View  Result Date: 01/10/2021 CLINICAL DATA:  Back pain for 3 weeks, initial encounter EXAM: PORTABLE CHEST 1 VIEW COMPARISON:  11/07/2019 FINDINGS: Postsurgical changes are again seen and stable. Cardiac shadow is mildly enlarged. Aortic calcifications are noted. Lungs are clear bilaterally. Mild vascular congestion is noted centrally. No bony abnormality is noted. IMPRESSION: Mild vascular congestion without other acute abnormality Electronically Signed   By: Inez Catalina M.D.   On: 01/12/2021 21:13   ECHOCARDIOGRAM COMPLETE  Result Date: 01/19/2021    ECHOCARDIOGRAM REPORT   Patient Name:   Ryan Mcclure Date of Exam: 01/18/2021 Medical Rec #:  [id number]      Height:       67.0 in Accession #:    [id number]     Weight:       165.3 lb Date of Birth:  [birth year]      BSA:          1.865 m Patient Age:    83 years       BP:           134/68 mmHg Patient Gender: M              HR:           73 bpm. Exam Location:  [facility] Procedure: 2D Echo, Color Doppler and Cardiac Doppler Indications:     I50.31 congestive heart failure-Acute Diastolic  History:         Patient has prior history of Echocardiogram examinations. CHF,                  Prior CABG, CKD, stage III and Stroke; Risk Factors:Diabetes                  and HCL.  Sonographer:     Charmayne Sheer Referring Phys:  [id number] [place] Diagnosing Phys: Isaias Cowman MD IMPRESSIONS  1. Left ventricular ejection fraction, by estimation, is 45 to 50%. The left ventricle  has mildly decreased function. The left ventricle has no regional wall motion abnormalities. Left ventricular diastolic parameters were normal.  2. Right ventricular systolic function is normal. The right ventricular size is normal.  3. Left atrial size was mildly dilated.  4. The mitral valve is normal in structure. Mild mitral valve regurgitation. No evidence of mitral stenosis.  5. The aortic valve is normal in structure. Aortic valve regurgitation is  not visualized. Aortic valve sclerosis is present, with no evidence of aortic valve stenosis.  6. The inferior vena cava is normal in size with greater than 50% respiratory variability, suggesting right atrial pressure of 3 mmHg. FINDINGS  Left Ventricle: Left ventricular ejection fraction, by estimation, is 45 to 50%. The left ventricle has mildly decreased function. The left ventricle has no regional wall motion abnormalities. The left ventricular internal cavity size was normal in size. There is no left ventricular hypertrophy. Left ventricular diastolic parameters were normal. Right Ventricle: The right ventricular size is normal. No increase in right ventricular wall thickness. Right ventricular systolic function is normal. Left Atrium: Left atrial size was mildly dilated. Right Atrium: Right atrial size was normal in size. Pericardium: There is no evidence of pericardial effusion. Mitral Valve: The mitral valve is normal in structure. Mild mitral valve regurgitation. No evidence of mitral valve stenosis. MV peak gradient, 16.6 mmHg. The mean mitral valve gradient is 7.0 mmHg. Tricuspid Valve: The tricuspid valve is normal in structure. Tricuspid valve regurgitation is mild . No evidence of tricuspid stenosis. Aortic Valve: The aortic valve is normal in structure. Aortic valve regurgitation is not visualized. Aortic valve sclerosis is present, with no evidence of aortic valve stenosis. Aortic valve mean gradient measures 14.5 mmHg. Aortic valve peak gradient  measures 25.2 mmHg. Aortic valve area, by VTI measures 2.91 cm. Pulmonic Valve: The pulmonic valve was normal in structure. Pulmonic valve regurgitation is not visualized. No evidence of pulmonic stenosis. Aorta: The aortic root is normal in size and structure. Venous: The inferior vena cava is normal in size with greater than 50% respiratory variability, suggesting right atrial pressure of 3 mmHg. IAS/Shunts: No atrial level shunt detected by color flow Doppler.  LEFT VENTRICLE PLAX 2D LVIDd:         3.95 cm   Diastology LVIDs:         3.09 cm   LV e' medial:    4.57 cm/s LV PW:         1.05 cm   LV E/e' medial:  37.6 LV IVS:        1.01 cm   LV e' lateral:   6.53 cm/s LVOT diam:     2.10 cm   LV E/e' lateral: 26.3 LV SV:         147 LV SV Index:   79 LVOT Area:     3.46 cm  RIGHT VENTRICLE RV Basal diam:  3.81 cm RV S prime:     6.31 cm/s LEFT ATRIUM             Index        RIGHT ATRIUM           Index LA diam:        4.10 cm 2.20 cm/m   RA Area:     19.80 cm LA Vol (A2C):   71.1 ml 38.12 ml/m  RA Volume:   56.30 ml  30.19 ml/m LA Vol (A4C):   90.2 ml 48.36 ml/m LA Biplane Vol: 81.7 ml 43.80 ml/m  AORTIC VALVE                     PULMONIC VALVE AV Area (Vmax):    3.01 cm      PV Vmax:       0.88 m/s AV Area (Vmean):   2.70 cm      PV Vmean:      59.000 cm/s AV Area (VTI):  2.91 cm      PV VTI:        0.185 m AV Vmax:           251.00 cm/s   PV Peak grad:  3.1 mmHg AV Vmean:          175.500 cm/s  PV Mean grad:  2.0 mmHg AV VTI:            0.503 m AV Peak Grad:      25.2 mmHg AV Mean Grad:      14.5 mmHg LVOT Vmax:         218.00 cm/s LVOT Vmean:        137.000 cm/s LVOT VTI:          0.423 m LVOT/AV VTI ratio: 0.84  AORTA Ao Root diam: 2.50 cm MITRAL VALVE MV Area (PHT): 3.02 cm     SHUNTS MV Area VTI:   2.64 cm     Systemic VTI:  0.42 m MV Peak grad:  16.6 mmHg    Systemic Diam: 2.10 cm MV Mean grad:  7.0 mmHg MV Vmax:       2.04 m/s MV Vmean:      126.0 cm/s MV Decel Time: 251 msec MV E  velocity: 172.00 cm/s MV A velocity: 159.00 cm/s MV E/A ratio:  1.08 Isaias Cowman MD Electronically signed by Isaias Cowman MD Signature Date/Time: 01/19/2021/8:39:50 AM    Final    CT CHEST ABDOMEN PELVIS WO CONTRAST  Result Date: 01/24/2021 CLINICAL DATA:  Sepsis. Mid back pain for approx 3 weeks that comes and goes. Denies falling or injury, denies hx of back pain, denies urinary changes. EXAM: CT CHEST, ABDOMEN AND PELVIS WITHOUT CONTRAST TECHNIQUE: Multidetector CT imaging of the chest, abdomen and pelvis was performed following the standard protocol without IV contrast. COMPARISON:  CT abdomen pelvis 04/27/2012. FINDINGS: CT CHEST FINDINGS Cardiovascular: Prominent heart size. No significant pericardial effusion. The thoracic aorta is normal in caliber. Severe atherosclerotic plaque of the thoracic aorta. Aortic valve replacement. Four-vessel coronary artery calcifications status post coronary artery bypass graft. The main pulmonary artery is normal in caliber. Mediastinum/Nodes: No gross hilar adenopathy, noting limited sensitivity for the detection of hilar adenopathy on this noncontrast study. No enlarged mediastinal or axillary lymph nodes. Thyroid gland, trachea, and esophagus demonstrate no significant findings. Lungs/Pleura: Mild emphysematous changes. Left lower lobe atelectasis versus scarring. No focal consolidation. There is a new 1.2 cm ground-glass airspace opacity within the right upper lobe (4:45, 5:84). Few other scattered regions of less well-defined ground-glass airspace opacity within the right lung. No pulmonary mass. No pleural effusion. No pneumothorax. Musculoskeletal: No chest wall abnormality. No suspicious lytic or blastic osseous lesions. No acute displaced fracture. Please see separately dictated CT thoracolumbar spine 01/03/2021. CT ABDOMEN PELVIS FINDINGS Hepatobiliary: No focal liver abnormality. Calcified gallstone noted within the gallbladder lumen. No  gallbladder wall thickening or pericholecystic fluid. A calcified stone is noted in the distal common bile duct measuring up to 3 mm (5:70, 2:66). No biliary dilatation. Pancreas: No focal lesion. Normal pancreatic contour. No surrounding inflammatory changes. No main pancreatic ductal dilatation. Spleen: Normal in size without focal abnormality. Adrenals/Urinary Tract: No adrenal nodule bilaterally. Nonspecific bilateral perinephric stranding. Calcifications associated with the kidneys likely vascular. No nephrolithiasis and no hydronephrosis. No definite contour-deforming renal mass. No ureterolithiasis or hydroureter. The urinary bladder is unremarkable. Stomach/Bowel: Stomach is within normal limits. No evidence of bowel wall thickening or dilatation. Diffuse colonic diverticulosis. Appendix appears normal. Vascular/Lymphatic:  No abdominal aorta or iliac aneurysm. Severe atherosclerotic plaque of the aorta and its branches. No abdominal, pelvic, or inguinal lymphadenopathy. Reproductive: Prostate is unremarkable. Other: Trace free fluid and fat stranding within the left pelvis just anterior to the inguinal canal of unclear etiology (2:94, 5:67). Finding may be related to a small left inguinal hernia. No intraperitoneal free gas. No organized fluid collection. Musculoskeletal: No abdominal wall hernia or abnormality. No suspicious lytic or blastic osseous lesions. No acute displaced fracture. Please see separately dictated CT thoracolumbar spine 01/28/2021. IMPRESSION: 1. Interval development of a a 1.2 cm ground-glass airspace opacity within the right upper lobe as well as few other scattered regions of less well-defined ground-glass airspace opacity within the right lung. Findings represent infection/inflammation. Non-contrast chest CT at 3-6 months is recommended. If nodules persist, subsequent management will be based upon the most suspicious nodule(s). This recommendation follows the consensus statement:  Guidelines for Management of Incidental Pulmonary Nodules Detected on CT Images: From the Fleischner Society 2017; Radiology 2017; 284:228-243. 2. Cholelithiasis as well as 3 mm distal common bile duct cholelithiasis. No associate CT findings of acute cholecystitis or biliary ductal dilatation. 3. Diffuse colonic diverticulosis with no acute diverticulitis. 4. Trace free fluid and fat stranding within the left pelvis just anterior to the inguinal canal of unclear etiology. Finding may be related to a small left inguinal hernia. Correlate with physical exam for inguinal hernia incarceration. Recommend attention on follow-up. 5. Aortic Atherosclerosis (ICD10-I70.0) and Emphysema (ICD10-J43.9). 6. Please note markedly limited evaluation on this noncontrast study. 7. Please see separately dictated CT thoracolumbar spine 01/25/2021. Electronically Signed   By: Iven Finn M.D.   On: 01/13/2021 22:36     Scheduled Meds:  amLODipine  5 mg Oral BID   atorvastatin  40 mg Oral Daily   clopidogrel  75 mg Oral QHS   enoxaparin (LOVENOX) injection  30 mg Subcutaneous Q24H   ferrous sulfate  325 mg Oral TID WC   furosemide  20 mg Intravenous Q12H   guaiFENesin  600 mg Oral BID   insulin aspart  0-9 Units Subcutaneous TID WC   insulin aspart  2 Units Subcutaneous TID WC   insulin glargine-yfgn  24 Units Subcutaneous QHS   magnesium oxide  400 mg Oral BID   metoprolol tartrate  75 mg Oral BID   pantoprazole  40 mg Oral Daily   vitamin B-12  1,000 mcg Oral Daily   Continuous Infusions:  azithromycin 500 mg (01/18/21 2211)   cefTRIAXone (ROCEPHIN)  IV 2 g (01/18/21 2126)     LOS: 2 days   Time spent: [place], MD [facility] To contact the attending provider between 7A-7P or the covering provider during after hours 7P-7A, please log into the web site [email or web address] and access using universal  password for that web site. If you do not have the password, please call the  hospital operator.  01/19/2021, 8:53 AM

## 2021-01-19 NOTE — Evaluation (Signed)
Physical Therapy Evaluation Patient Details Name: Ryan Mcclure MRN: [id number] DOB: Nov 11, 1931 Today's Date: 01/19/2021  History of Present Illness  Patient is a 87 year old male with medical history significant for systolic CHF, stage III chronic kidney disease, type 2 diabetes mellitus, CVA, GERD and dyslipidemia, presented to the ER with acute onset of low back pain for the last couple of days with radiculopathy. Acute on chronic systolic CHF, Right-sided community-acquired pneumonia, Acute hypoxic respiratory failure. Imaging with chronically stable L1-L2 compression fractures   Clinical Impression  Patient is lethargic but agreeable to PT evaluation. He reports he lives alone at [facility], has meals delivered by meals on wheels, requires assistance for ADLs at baseline, but is independent with walking using a rolling walker at baseline with no oxygen required.  Patient required assistance for bed mobility and moderate assistance provided for stand step transfer from bed to recliner chair. He is lethargic and needs cues to maintain alertness at times. Sp02 was 81% on room air and 89% on 2 L02 with cues for breathing techniques. Patient is likely not at his baseline level of functional mobility. As he lives alone, consider SNF placement at this time. PT will continue to follow to maximize independence and facilitate return to prior level of function.      Recommendations for follow up therapy are one component of a multi-disciplinary discharge planning process, led by the attending physician.  Recommendations may be updated based on patient status, additional functional criteria and insurance authorization.  Follow Up Recommendations Skilled nursing-short term rehab (<3 hours/day)    Assistance Recommended at Discharge Frequent or constant Supervision/Assistance  Functional Status Assessment Patient has had a recent decline in their functional status and demonstrates the ability to make  significant improvements in function in a reasonable and predictable amount of time.  Equipment Recommendations  None recommended by PT    Recommendations for Other Services       Precautions / Restrictions Precautions Precautions: Fall Restrictions Weight Bearing Restrictions: No      Mobility  Bed Mobility Overal bed mobility: Needs Assistance Bed Mobility: Supine to Sit     Supine to sit: Min assist     General bed mobility comments: assistance for trunk support to sit upright. increased time and effort required with mobility efforts    Transfers Overall transfer level: Needs assistance Equipment used: Rolling walker (2 wheels) Transfers: Bed to chair/wheelchair/BSC     Step pivot transfers: Mod assist       General transfer comment: verbal cues for hand placement for safety. lifting assistance required for standing and steadying assistance provided during the transfer from bed to chair while standing using rolling walker for support. patient is lethargic during session and fatigued with activity. unsafe to progress standing mobility further at this time    Ambulation/Gait               General Gait Details: not assessed due to poor activity tolerance  Stairs            Wheelchair Mobility    Modified Rankin (Stroke Patients Only)       Balance Overall balance assessment: Needs assistance Sitting-balance support: Feet supported Sitting balance-Leahy Scale: Fair Sitting balance - Comments: no loss of balance in sitting. close stand by assistance for safety   Standing balance support: Bilateral upper extremity supported Standing balance-Leahy Scale: Fair Standing balance comment: relying on rolling walker for support in standing  Pertinent Vitals/Pain Pain Assessment: No/denies pain    Home Living Family/patient expects to be discharged to:: Private residence ([place]) Living  Arrangements: Alone Available Help at Discharge: Family Type of Home: House Home Access: Level entry       Home Layout: One level Home Equipment: Conservation officer, nature (2 wheels) Additional Comments: patient gets meals on wheels    Prior Function Prior Level of Function : Needs assist       Physical Assist : ADLs (physical)   ADLs (physical): Bathing;Dressing Mobility Comments: patient reports he ambulates independently using rolling walker ADLs Comments: has aide assistance for ADLs daily per his report. he does not drive with transportation needs set-up by the facility.     Hand Dominance        Extremity/Trunk Assessment   Upper Extremity Assessment Upper Extremity Assessment: Generalized weakness    Lower Extremity Assessment Lower Extremity Assessment: RLE deficits/detail;LLE deficits/detail RLE Deficits / Details: dorsiflexion, plantarflexion, knee extension 5/5. however endurance impaired for sustianed activity in standing LLE Deficits / Details: dorsiflexion, plantarflexion, knee extension 5/5. however endurance impaired for sustianed activity in standing       Communication   Communication: No difficulties  Cognition Arousal/Alertness: Lethargic Behavior During Therapy: WFL for tasks assessed/performed Overall Cognitive Status: No family/caregiver present to determine baseline cognitive functioning                                 General Comments: patient oriented to person, place. disoriented to time. he is able to follow single step commands with increased time. lethargic with eyes closed for part of session        General Comments General comments (skin integrity, edema, etc.): oxygen was laying at the bedside on arrival to room. Sp02 81% on room air and increased to 89% with 2 L02 replaced and cues for breathing techniques. patient set-up with breakfast tray at end of session in recliner chair.    Exercises     Assessment/Plan    PT  Assessment Patient needs continued PT services  PT Problem List Decreased strength;Decreased activity tolerance;Decreased balance;Decreased mobility;Decreased cognition;Decreased knowledge of use of DME;Decreased safety awareness       PT Treatment Interventions DME instruction;Gait training;Functional mobility training;Therapeutic activities;Therapeutic exercise;Balance training;Neuromuscular re-education;Cognitive remediation;Patient/family education    PT Goals (Current goals can be found in the Care Plan section)  Acute Rehab PT Goals Patient Stated Goal: to get stronger PT Goal Formulation: With patient Time For Goal Achievement: 02/02/21 Potential to Achieve Goals: Good    Frequency Min 2X/week   Barriers to discharge Decreased caregiver support      Co-evaluation               AM-PAC PT "6 Clicks" Mobility  Outcome Measure Help needed turning from your back to your side while in a flat bed without using bedrails?: A Little Help needed moving from lying on your back to sitting on the side of a flat bed without using bedrails?: A Lot Help needed moving to and from a bed to a chair (including a wheelchair)?: A Lot Help needed standing up from a chair using your arms (e.g., wheelchair or bedside chair)?: A Lot Help needed to walk in hospital room?: A Little Help needed climbing 3-5 steps with a railing? : A Lot 6 Click Score: 14    End of Session Equipment Utilized During Treatment: Gait belt Activity Tolerance: Patient limited by  lethargy;Patient tolerated treatment well Patient left: in chair;with call bell/phone within reach;with chair alarm set Nurse Communication: Mobility status PT Visit Diagnosis: Muscle weakness (generalized) (M62.81);Unsteadiness on feet (R26.81)    Time: 5521-7471 PT Time Calculation (min) (ACUTE ONLY): 24 min   Charges:   PT Evaluation $PT Eval Moderate Complexity: 1 Mod PT Treatments $Therapeutic Activity: 8-22 mins         Minna Merritts, PT, MPT   Percell Locus 01/19/2021, 11:43 AM

## 2021-01-19 NOTE — Progress Notes (Signed)
[facility] Cardiology    SUBJECTIVE: Patient seen and examined at bedside after he finished eating breakfast.  He denies any chest pain, palpitations and states he is breathing better and feeling overall better than yesterday.  He remains on oxygen by nasal cannula and does not use oxygen at his baseline.   Vitals:   01/19/21 0009 01/19/21 0351 01/19/21 0400 01/19/21 0716  BP: (!) 141/56 (!) 145/52  (!) 136/56  Pulse: 69 78  76  Resp: 18 19  20   Temp: 98.5 F (36.9 C) 98.5 F (36.9 C)  98.7 F (37.1 C)  TempSrc:  Oral  Oral  SpO2: 94% 94%  96%  Weight:   75.4 kg   Height:         Intake/Output Summary (Last 24 hours) at 01/19/2021 0960 Last data filed at 01/19/2021 0600 Gross per 24 hour  Intake 1410.09 ml  Output 1375 ml  Net 35.09 ml     PHYSICAL EXAM General: Elderly appearing black male, well nourished, in no acute distress. HEENT:  Normocephalic and atraumatic. Neck:   No JVD.  Lungs: Normal respiratory effort on oxygen by nasal cannula.   Heart: HRRR . Normal S1 and S2 without gallops or murmurs.  Abdomen: Non-distended appearing.  Msk: Normal strength and tone for age. Extremities: Bilateral 1+ pitting edema improved from 12/21.  No clubbing, cyanosis. Neuro: Alert and oriented X 3. Psych:  Mood appropriate, affect congruent.   LABS: Basic Metabolic Panel: Recent Labs    01/18/21 0324 01/18/21 0658 01/19/21 0636  NA 138  --  138  K 3.5  --  3.4*  CL 107  --  108  CO2 24  --  23  GLUCOSE 214*  --  211*  BUN 45*  --  46*  CREATININE 2.67*  --  2.99*  CALCIUM 8.9  --  8.8*  MG  --  1.9 1.6*   Liver Function Tests: Recent Labs    01/21/2021 2051 01/19/21 0636  AST 17 16  ALT 12 12  ALKPHOS 77 59  BILITOT 0.9 0.8  PROT 8.2* 6.5  ALBUMIN 3.5 2.7*   No results for input(s): LIPASE, AMYLASE in the last 72 hours. CBC: Recent Labs    01/06/2021 2051 01/18/21 0324 01/19/21 0636  WBC 14.3* 13.7* 13.2*  NEUTROABS 11.1*  --  10.6*  HGB 10.6* 9.5* 8.2*   HCT 33.6* 30.3* 25.7*  MCV 83.8 83.9 83.4  PLT 139* 125* 119*   Cardiac Enzymes: No results for input(s): CKTOTAL, CKMB, CKMBINDEX, TROPONINI in the last 72 hours. BNP: Invalid input(s): POCBNP D-Dimer: No results for input(s): DDIMER in the last 72 hours. Hemoglobin A1C: Recent Labs    01/18/21 0810  HGBA1C 6.4*   Fasting Lipid Panel: No results for input(s): CHOL, HDL, LDLCALC, TRIG, CHOLHDL, LDLDIRECT in the last 72 hours. Thyroid Function Tests: No results for input(s): TSH, T4TOTAL, T3FREE, THYROIDAB in the last 72 hours.  Invalid input(s): FREET3 Anemia Panel: No results for input(s): VITAMINB12, FOLATE, FERRITIN, TIBC, IRON, RETICCTPCT in the last 72 hours.  CT T-SPINE NO CHARGE  Result Date: 01/24/2021 CLINICAL DATA:  lower mid back pain for approx 3 weeks that comes and goes. Denies falling or injury, denies hx of back pain, denies urinary changes. EXAM: CT THORACIC AND LUMBAR SPINE WITHOUT CONTRAST TECHNIQUE: Multidetector CT imaging of the thoracic and lumbar spine was performed without contrast. Multiplanar CT image reconstructions were also generated. COMPARISON:  MRI lumbar spine 08/24/2009 FINDINGS: CT THORACIC SPINE FINDINGS Alignment:  Normal. Vertebrae: Diffusely decreased bone density. Multilevel mild degenerative changes of the spine. Superior endplate T 7 Schmorl node. No acute fracture or focal pathologic process. Paraspinal and other soft tissues: Negative. Disc levels: Maintained. CT LUMBAR SPINE FINDINGS Segmentation: 5 lumbar type vertebrae. Alignment: Normal. Vertebrae: Diffusely decreased bone density. Chronic compression fracture of the L1 vertebral body with greater than 35% vertebral body height loss. Chronic compression fracture of the L2 vertebral body with greater than 45% height loss. No acute fracture or focal pathologic process. Paraspinal and other soft tissues: Negative. Disc levels: Maintained. IMPRESSION: CT THORACIC SPINE IMPRESSION No acute  displaced fracture or traumatic listhesis of the thoracic spine. CT LUMBAR SPINE IMPRESSION No acute displaced fracture or traumatic listhesis of the lumbar spine in a patient with chronic grossly stable L1 and L2 compression fractures. Please see separately dictated CT chest, abdomen, pelvis 12/31/2020. Aortic Atherosclerosis (ICD10-I70.0) and Emphysema (ICD10-J43.9). Electronically Signed   By: Iven Finn M.D.   On: 01/11/2021 22:46   CT L-SPINE NO CHARGE  Result Date: 01/12/2021 CLINICAL DATA:  lower mid back pain for approx 3 weeks that comes and goes. Denies falling or injury, denies hx of back pain, denies urinary changes. EXAM: CT THORACIC AND LUMBAR SPINE WITHOUT CONTRAST TECHNIQUE: Multidetector CT imaging of the thoracic and lumbar spine was performed without contrast. Multiplanar CT image reconstructions were also generated. COMPARISON:  MRI lumbar spine 08/24/2009 FINDINGS: CT THORACIC SPINE FINDINGS Alignment: Normal. Vertebrae: Diffusely decreased bone density. Multilevel mild degenerative changes of the spine. Superior endplate T 7 Schmorl node. No acute fracture or focal pathologic process. Paraspinal and other soft tissues: Negative. Disc levels: Maintained. CT LUMBAR SPINE FINDINGS Segmentation: 5 lumbar type vertebrae. Alignment: Normal. Vertebrae: Diffusely decreased bone density. Chronic compression fracture of the L1 vertebral body with greater than 35% vertebral body height loss. Chronic compression fracture of the L2 vertebral body with greater than 45% height loss. No acute fracture or focal pathologic process. Paraspinal and other soft tissues: Negative. Disc levels: Maintained. IMPRESSION: CT THORACIC SPINE IMPRESSION No acute displaced fracture or traumatic listhesis of the thoracic spine. CT LUMBAR SPINE IMPRESSION No acute displaced fracture or traumatic listhesis of the lumbar spine in a patient with chronic grossly stable L1 and L2 compression fractures. Please see  separately dictated CT chest, abdomen, pelvis 01/09/2021. Aortic Atherosclerosis (ICD10-I70.0) and Emphysema (ICD10-J43.9). Electronically Signed   By: Iven Finn M.D.   On: 01/14/2021 22:46   DG Chest Port 1 View  Result Date: 01/16/2021 CLINICAL DATA:  Back pain for 3 weeks, initial encounter EXAM: PORTABLE CHEST 1 VIEW COMPARISON:  11/07/2019 FINDINGS: Postsurgical changes are again seen and stable. Cardiac shadow is mildly enlarged. Aortic calcifications are noted. Lungs are clear bilaterally. Mild vascular congestion is noted centrally. No bony abnormality is noted. IMPRESSION: Mild vascular congestion without other acute abnormality Electronically Signed   By: Inez Catalina M.D.   On: 01/09/2021 21:13   ECHOCARDIOGRAM COMPLETE  Result Date: 01/19/2021    ECHOCARDIOGRAM REPORT   Patient Name:   Ryan Mcclure Date of Exam: 01/18/2021 Medical Rec #:  [id number]      Height:       67.0 in Accession #:    [id number]     Weight:       165.3 lb Date of Birth:  1931-10-22      BSA:          1.865 m Patient Age:    87 years  BP:           134/68 mmHg Patient Gender: M              HR:           73 bpm. Exam Location:  [facility] Procedure: 2D Echo, Color Doppler and Cardiac Doppler Indications:     I50.31 congestive heart failure-Acute Diastolic  History:         Patient has prior history of Echocardiogram examinations. CHF,                  Prior CABG, CKD, stage III and Stroke; Risk Factors:Diabetes                  and HCL.  Sonographer:     Charmayne Sheer Referring Phys:  [id number] [place] Diagnosing Phys: Isaias Cowman MD IMPRESSIONS  1. Left ventricular ejection fraction, by estimation, is 45 to 50%. The left ventricle has mildly decreased function. The left ventricle has no regional wall motion abnormalities. Left ventricular diastolic parameters were normal.  2. Right ventricular systolic function is normal. The right ventricular size is normal.  3. Left atrial size was mildly dilated.  4. The  mitral valve is normal in structure. Mild mitral valve regurgitation. No evidence of mitral stenosis.  5. The aortic valve is normal in structure. Aortic valve regurgitation is not visualized. Aortic valve sclerosis is present, with no evidence of aortic valve stenosis.  6. The inferior vena cava is normal in size with greater than 50% respiratory variability, suggesting right atrial pressure of 3 mmHg. FINDINGS  Left Ventricle: Left ventricular ejection fraction, by estimation, is 45 to 50%. The left ventricle has mildly decreased function. The left ventricle has no regional wall motion abnormalities. The left ventricular internal cavity size was normal in size. There is no left ventricular hypertrophy. Left ventricular diastolic parameters were normal. Right Ventricle: The right ventricular size is normal. No increase in right ventricular wall thickness. Right ventricular systolic function is normal. Left Atrium: Left atrial size was mildly dilated. Right Atrium: Right atrial size was normal in size. Pericardium: There is no evidence of pericardial effusion. Mitral Valve: The mitral valve is normal in structure. Mild mitral valve regurgitation. No evidence of mitral valve stenosis. MV peak gradient, 16.6 mmHg. The mean mitral valve gradient is 7.0 mmHg. Tricuspid Valve: The tricuspid valve is normal in structure. Tricuspid valve regurgitation is mild . No evidence of tricuspid stenosis. Aortic Valve: The aortic valve is normal in structure. Aortic valve regurgitation is not visualized. Aortic valve sclerosis is present, with no evidence of aortic valve stenosis. Aortic valve mean gradient measures 14.5 mmHg. Aortic valve peak gradient measures 25.2 mmHg. Aortic valve area, by VTI measures 2.91 cm. Pulmonic Valve: The pulmonic valve was normal in structure. Pulmonic valve regurgitation is not visualized. No evidence of pulmonic stenosis. Aorta: The aortic root is normal in size and structure. Venous: The inferior  vena cava is normal in size with greater than 50% respiratory variability, suggesting right atrial pressure of 3 mmHg. IAS/Shunts: No atrial level shunt detected by color flow Doppler.  LEFT VENTRICLE PLAX 2D LVIDd:         3.95 cm   Diastology LVIDs:         3.09 cm   LV e' medial:    4.57 cm/s LV PW:         1.05 cm   LV E/e' medial:  37.6 LV IVS:  1.01 cm   LV e' lateral:   6.53 cm/s LVOT diam:     2.10 cm   LV E/e' lateral: 26.3 LV SV:         147 LV SV Index:   79 LVOT Area:     3.46 cm  RIGHT VENTRICLE RV Basal diam:  3.81 cm RV S prime:     6.31 cm/s LEFT ATRIUM             Index        RIGHT ATRIUM           Index LA diam:        4.10 cm 2.20 cm/m   RA Area:     19.80 cm LA Vol (A2C):   71.1 ml 38.12 ml/m  RA Volume:   56.30 ml  30.19 ml/m LA Vol (A4C):   90.2 ml 48.36 ml/m LA Biplane Vol: 81.7 ml 43.80 ml/m  AORTIC VALVE                     PULMONIC VALVE AV Area (Vmax):    3.01 cm      PV Vmax:       0.88 m/s AV Area (Vmean):   2.70 cm      PV Vmean:      59.000 cm/s AV Area (VTI):     2.91 cm      PV VTI:        0.185 m AV Vmax:           251.00 cm/s   PV Peak grad:  3.1 mmHg AV Vmean:          175.500 cm/s  PV Mean grad:  2.0 mmHg AV VTI:            0.503 m AV Peak Grad:      25.2 mmHg AV Mean Grad:      14.5 mmHg LVOT Vmax:         218.00 cm/s LVOT Vmean:        137.000 cm/s LVOT VTI:          0.423 m LVOT/AV VTI ratio: 0.84  AORTA Ao Root diam: 2.50 cm MITRAL VALVE MV Area (PHT): 3.02 cm     SHUNTS MV Area VTI:   2.64 cm     Systemic VTI:  0.42 m MV Peak grad:  16.6 mmHg    Systemic Diam: 2.10 cm MV Mean grad:  7.0 mmHg MV Vmax:       2.04 m/s MV Vmean:      126.0 cm/s MV Decel Time: 251 msec MV E velocity: 172.00 cm/s MV A velocity: 159.00 cm/s MV E/A ratio:  1.08 Isaias Cowman MD Electronically signed by Isaias Cowman MD Signature Date/Time: 01/19/2021/8:39:50 AM    Final    CT CHEST ABDOMEN PELVIS WO CONTRAST  Result Date: 01/06/2021 CLINICAL DATA:  Sepsis. Mid  back pain for approx 3 weeks that comes and goes. Denies falling or injury, denies hx of back pain, denies urinary changes. EXAM: CT CHEST, ABDOMEN AND PELVIS WITHOUT CONTRAST TECHNIQUE: Multidetector CT imaging of the chest, abdomen and pelvis was performed following the standard protocol without IV contrast. COMPARISON:  CT abdomen pelvis 04/27/2012. FINDINGS: CT CHEST FINDINGS Cardiovascular: Prominent heart size. No significant pericardial effusion. The thoracic aorta is normal in caliber. Severe atherosclerotic plaque of the thoracic aorta. Aortic valve replacement. Four-vessel coronary artery calcifications status post coronary artery bypass graft. The main pulmonary artery is  normal in caliber. Mediastinum/Nodes: No gross hilar adenopathy, noting limited sensitivity for the detection of hilar adenopathy on this noncontrast study. No enlarged mediastinal or axillary lymph nodes. Thyroid gland, trachea, and esophagus demonstrate no significant findings. Lungs/Pleura: Mild emphysematous changes. Left lower lobe atelectasis versus scarring. No focal consolidation. There is a new 1.2 cm ground-glass airspace opacity within the right upper lobe (4:45, 5:84). Few other scattered regions of less well-defined ground-glass airspace opacity within the right lung. No pulmonary mass. No pleural effusion. No pneumothorax. Musculoskeletal: No chest wall abnormality. No suspicious lytic or blastic osseous lesions. No acute displaced fracture. Please see separately dictated CT thoracolumbar spine 01/23/2021. CT ABDOMEN PELVIS FINDINGS Hepatobiliary: No focal liver abnormality. Calcified gallstone noted within the gallbladder lumen. No gallbladder wall thickening or pericholecystic fluid. A calcified stone is noted in the distal common bile duct measuring up to 3 mm (5:70, 2:66). No biliary dilatation. Pancreas: No focal lesion. Normal pancreatic contour. No surrounding inflammatory changes. No main pancreatic ductal  dilatation. Spleen: Normal in size without focal abnormality. Adrenals/Urinary Tract: No adrenal nodule bilaterally. Nonspecific bilateral perinephric stranding. Calcifications associated with the kidneys likely vascular. No nephrolithiasis and no hydronephrosis. No definite contour-deforming renal mass. No ureterolithiasis or hydroureter. The urinary bladder is unremarkable. Stomach/Bowel: Stomach is within normal limits. No evidence of bowel wall thickening or dilatation. Diffuse colonic diverticulosis. Appendix appears normal. Vascular/Lymphatic: No abdominal aorta or iliac aneurysm. Severe atherosclerotic plaque of the aorta and its branches. No abdominal, pelvic, or inguinal lymphadenopathy. Reproductive: Prostate is unremarkable. Other: Trace free fluid and fat stranding within the left pelvis just anterior to the inguinal canal of unclear etiology (2:94, 5:67). Finding may be related to a small left inguinal hernia. No intraperitoneal free gas. No organized fluid collection. Musculoskeletal: No abdominal wall hernia or abnormality. No suspicious lytic or blastic osseous lesions. No acute displaced fracture. Please see separately dictated CT thoracolumbar spine 01/25/2021. IMPRESSION: 1. Interval development of a a 1.2 cm ground-glass airspace opacity within the right upper lobe as well as few other scattered regions of less well-defined ground-glass airspace opacity within the right lung. Findings represent infection/inflammation. Non-contrast chest CT at 3-6 months is recommended. If nodules persist, subsequent management will be based upon the most suspicious nodule(s). This recommendation follows the consensus statement: Guidelines for Management of Incidental Pulmonary Nodules Detected on CT Images: From the Fleischner Society 2017; Radiology 2017; 284:228-243. 2. Cholelithiasis as well as 3 mm distal common bile duct cholelithiasis. No associate CT findings of acute cholecystitis or biliary ductal  dilatation. 3. Diffuse colonic diverticulosis with no acute diverticulitis. 4. Trace free fluid and fat stranding within the left pelvis just anterior to the inguinal canal of unclear etiology. Finding may be related to a small left inguinal hernia. Correlate with physical exam for inguinal hernia incarceration. Recommend attention on follow-up. 5. Aortic Atherosclerosis (ICD10-I70.0) and Emphysema (ICD10-J43.9). 6. Please note markedly limited evaluation on this noncontrast study. 7. Please see separately dictated CT thoracolumbar spine 01/24/2021. Electronically Signed   By: Iven Finn M.D.   On: 01/08/2021 22:36     ECHO performed 01/18/21 revealed  IMPRESSIONS    1. Left ventricular ejection fraction, by estimation, is 45 to 50%. The  left ventricle has mildly decreased function. The left ventricle has no  regional wall motion abnormalities. Left ventricular diastolic parameters  were normal.   2. Right ventricular systolic function is normal. The right ventricular  size is normal.   3. Left atrial size was mildly dilated.  4. The mitral valve is normal in structure. Mild mitral valve  regurgitation. No evidence of mitral stenosis.   5. The aortic valve is normal in structure. Aortic valve regurgitation is  not visualized. Aortic valve sclerosis is present, with no evidence of  aortic valve stenosis.   6. The inferior vena cava is normal in size with greater than 50%  respiratory variability, suggesting right atrial pressure of 3 mmHg.   TELEMETRY reviewed by me: Normal sinus rhythm with ventricular rate of 72 bpm frequent PVCs  ASSESSMENT AND PLAN:  Principal Problem:   Acute CHF (congestive heart failure) (HCC)   #Abnormal EKG Upon my personal review and review with Dr. Isaias Cowman the patient does not appear to be in atrial fibrillation and therefore will not require anticoagulation at this time.  His initial EKG revealed sinus rhythm with RBBB and a PVC with  significant amounts of artifact. Will continue to monitor on telemetry    #Acute on chronic HFmrEF (01/18/21 LVEF 45-50%) #ischemic cardiomyopathy Continue diuresis with IV Lasix 40 mg IV every 12 Continue metoprolol to 75 mg twice daily for better heart rate control.  Hold parameters in place for blood pressure and heart rate. As needed nitroglycerin for chest pain Encouraged low-salt diet and monitor I's and O's Encouraged ambulation  Echocardiogram performed yesterday resulted with LVEF 45-50%, compared to 2020 which revealed LVEF 55-60%. ?Consider addition of spironolactone for further diuresis and potassium reabsorption  #hypertension #coronary artery disease s/p CABG Continue amlodipine 5 mg twice daily with considerations to switch to ACE inhibitor/ARB on an outpatient basis once renal function is back to baseline Continue plavix 75mg  once nightly, platelets today 125. Will continue to monitor. Lovenox for DVT prophylaxis   #CKD stage 4 Creatinine increasing today 2.99 (2.67 on 12/21) Monitor renal function and electrolytes Magnesium 1.9 today, replete   #Diabetes SSI per hospitalist   #dyslipidemia Continue high intensity statin Lipitor 40 mg   #Acute hypoxic respiratory failure 2/2 Community-acquired pneumonia Antibiotic therapy and supportive care as per hospitalist. Continue supportive care and antibiotic therapy as per hospitalist  This patient's case was discussed with Dr. Isaias Cowman and he is in agreement with this plan of care.   Tristan Schroeder, PA-C 01/19/2021 9:04 AM

## 2021-01-19 NOTE — Evaluation (Signed)
Occupational Therapy Evaluation Patient Details Name: Ryan Mcclure MRN: [id number] DOB: [birth year] Today's Date: 01/19/2021   History of Present Illness Patient is a 87 year old male with medical history significant for systolic CHF, stage III chronic kidney disease, type 2 diabetes mellitus, CVA, GERD and dyslipidemia, presented to the ER with acute onset of low back pain for the last couple of days with radiculopathy. Acute on chronic systolic CHF, Right-sided community-acquired pneumonia, Acute hypoxic respiratory failure. Imaging with chronically stable L1-L2 compression fractures   Clinical Impression   Pt seen for OT evaluation this date. He is somewhat drowsy when OT presents and requires cues to attend. He is pleasant and participatory. He is able to CTS with MIN A and requires MOD A for SPS with RW. Cues for safe use of RW throughout. He requires MOD A for seated LB ADLs d/t limited dynamic sitting balance and tolerance for bending. Pt requires having aide help for bathing at [place] at baseline. He is left in chair with chair alarm set end of session. All needs met and in reach. Because he is currently requiring increased assist for safe mobility, anticipate he will require f/u OT services in STR setting.      Recommendations for follow up therapy are one component of a multi-disciplinary discharge planning process, led by the attending physician.  Recommendations may be updated based on patient status, additional functional criteria and insurance authorization.   Follow Up Recommendations  Skilled nursing-short term rehab (<3 hours/day)    Assistance Recommended at Discharge Frequent or constant Supervision/Assistance  Functional Status Assessment  Patient has had a recent decline in their functional status and demonstrates the ability to make significant improvements in function in a reasonable and predictable amount of time.  Equipment Recommendations  Other (comment) (defer)     Recommendations for Other Services       Precautions / Restrictions Precautions Precautions: Fall Restrictions Weight Bearing Restrictions: No      Mobility Bed Mobility Overal bed mobility: Needs Assistance Bed Mobility: Supine to Sit     Supine to sit: Min assist          Transfers Overall transfer level: Needs assistance Equipment used: Rolling walker (2 wheels) Transfers: Bed to chair/wheelchair/BSC;Sit to/from Stand Sit to Stand: Min assist     Step pivot transfers: Mod assist     General transfer comment: increased assist for weight shift, cues for safe use of RW, noted to have weight in heels      Balance Overall balance assessment: Needs assistance Sitting-balance support: Feet supported Sitting balance-Leahy Scale: Fair Sitting balance - Comments: no loss of balance in sitting. close stand by assistance for safety   Standing balance support: Bilateral upper extremity supported;Reliant on assistive device for balance Standing balance-Leahy Scale: Fair                             ADL either performed or assessed with clinical judgement   ADL                                         General ADL Comments: SETUP for seated UB ADLs, MOD A for seated LB ADLs, MIN A for ADL transfers with RW     Vision Baseline Vision/History: 1 Wears glasses Patient Visual Report: No change from baseline  Perception     Praxis      Pertinent Vitals/Pain Pain Assessment: No/denies pain     Hand Dominance     Extremity/Trunk Assessment Upper Extremity Assessment Upper Extremity Assessment: Generalized weakness   Lower Extremity Assessment Lower Extremity Assessment: Generalized weakness       Communication Communication Communication: No difficulties   Cognition Arousal/Alertness: Lethargic Behavior During Therapy: WFL for tasks assessed/performed Overall Cognitive Status: No family/caregiver present to determine  baseline cognitive functioning                                 General Comments: patient oriented to person, place. disoriented to time. he is able to follow single step commands with increased time. lethargic with eyes closed for part of session     General Comments       Exercises Other Exercises Other Exercises: OT ed re: safety with ADL mobility with RW   Shoulder Instructions      Home Living Family/patient expects to be discharged to:: Private residence ([place]) Living Arrangements: Alone Available Help at Discharge: Family Type of Home: House Home Access: Level entry     Home Layout: One level     Bathroom Shower/Tub: [place]: Conservation officer, nature (2 wheels)   Additional Comments: patient gets meals on wheels      Prior Functioning/Environment Prior Level of Function : Needs assist       Physical Assist : ADLs (physical)   ADLs (physical): Bathing;Dressing Mobility Comments: patient reports he ambulates independently using rolling walker ADLs Comments: has aide assistance for ADLs daily per his report. he does not drive with transportation needs set-up by the facility.        OT Problem List: Decreased strength;Decreased activity tolerance      OT Treatment/Interventions: Therapeutic exercise;Self-care/ADL training;DME and/or AE instruction;Therapeutic activities;Patient/family education;Balance training    OT Goals(Current goals can be found in the care plan section) Acute Rehab OT Goals Patient Stated Goal: to go home OT Goal Formulation: With patient Time For Goal Achievement: 02/02/21 ADL Goals Pt Will Perform Upper Body Dressing: with modified independence Pt Will Perform Lower Body Dressing: with supervision;sit to/from stand Pt Will Transfer to Toilet: with supervision;ambulating Pt Will Perform Toileting - Clothing Manipulation and hygiene: with supervision;sit to/from stand  OT Frequency: Min 2X/week    Barriers to D/C:            Co-evaluation              AM-PAC OT "6 Clicks" Daily Activity     Outcome Measure Help from another person eating meals?: None Help from another person taking care of personal grooming?: A Little   Help from another person bathing (including washing, rinsing, drying)?: A Lot Help from another person to put on and taking off regular upper body clothing?: A Little Help from another person to put on and taking off regular lower body clothing?: A Lot 6 Click Score: 14   End of Session Equipment Utilized During Treatment: Gait belt;Rolling walker (2 wheels) Nurse Communication: Mobility status  Activity Tolerance: Patient tolerated treatment well Patient left: in chair;with call bell/phone within reach;with chair alarm set  OT Visit Diagnosis: Unsteadiness on feet (R26.81);Muscle weakness (generalized) (M62.81)                Time: 8144-8185 OT Time Calculation (min): 10 min Charges:  OT  Evaluation $OT Eval Moderate Complexity: 1 [address] [place], OTR/L ascom [phone] 01/19/21, 5:29 PM

## 2021-01-20 DIAGNOSIS — I50811 Acute right heart failure: Secondary | ICD-10-CM | POA: Diagnosis not present

## 2021-01-20 LAB — BASIC METABOLIC PANEL
Anion gap: 9 (ref 5–15)
BUN: 49 mg/dL — ABNORMAL HIGH (ref 8–23)
CO2: 23 mmol/L (ref 22–32)
Calcium: 9.3 mg/dL (ref 8.9–10.3)
Chloride: 107 mmol/L (ref 98–111)
Creatinine, Ser: 3.26 mg/dL — ABNORMAL HIGH (ref 0.61–1.24)
GFR, Estimated: 18 mL/min — ABNORMAL LOW (ref >60)
Glucose, Bld: 150 mg/dL — ABNORMAL HIGH (ref 70–99)
Potassium: 3.5 mmol/L (ref 3.5–5.1)
Sodium: 139 mmol/L (ref 135–145)

## 2021-01-20 LAB — GLUCOSE, CAPILLARY
Glucose-Capillary: 131 mg/dL — ABNORMAL HIGH (ref 70–99)
Glucose-Capillary: 163 mg/dL — ABNORMAL HIGH (ref 70–99)
Glucose-Capillary: 263 mg/dL — ABNORMAL HIGH (ref 70–99)
Glucose-Capillary: 208 mg/dL — ABNORMAL HIGH (ref 70–99)

## 2021-01-20 LAB — MAGNESIUM: Magnesium: 1.7 mg/dL (ref 1.7–2.4)

## 2021-01-20 MED ORDER — CEFDINIR 300 MG PO CAPS
300.0000 mg | ORAL_CAPSULE | ORAL | Status: AC
  Administered 2021-01-20 – 2021-01-23 (×4): 300 mg via ORAL
  Filled 2021-01-20 (×4): qty 1

## 2021-01-20 MED ORDER — MAGNESIUM SULFATE 2 GM/50ML IV SOLN
2.0000 g | Freq: Once | INTRAVENOUS | Status: AC
  Administered 2021-01-20: 09:00:00 2 g via INTRAVENOUS
  Filled 2021-01-20: qty 50

## 2021-01-20 MED ORDER — CEFDINIR 300 MG PO CAPS: 300.0000 mg | ORAL_CAPSULE | Freq: Two times a day (BID) | ORAL | Status: DC

## 2021-01-20 NOTE — Progress Notes (Signed)
SLP Follow Up Note  Patient Details Name: Ryan Mcclure MRN: [id number] DOB: 1931/04/12  Pt working with rehab but this Probation officer able to observe pt consume thin liquids. No overt s/s of aspiration observed. Chart indicates that pt is consuming current diet without any difficulty.   ST will sign off at this time.   Chatham Howington B. Rutherford Nail M.S., CCC-SLP, [place] Office [phone]  Stormy Fabian 01/20/2021, 10:41 AM

## 2021-01-20 NOTE — Progress Notes (Signed)
Mobility Specialist - Progress Note   01/20/21 1100  Mobility  Activity Ambulated in room  Level of Assistance Standby assist, set-up cues, supervision of patient - no hands on  Assistive Device Front wheel walker  Distance Ambulated (ft) 16 ft  Mobility Ambulated with assistance in room  Mobility Response Tolerated well  Mobility performed by Mobility specialist  $Mobility charge 1 Mobility    Pre-mobility: 77 HR, 90% SpO2 During mobility: 77 HR, 84-86% SpO2 Post-mobility: 72 HR, 90% SpO2   Pt sitting in recliner upon arrival, utilizing 2L. Pt stood to RW with extra time and ambulated in room 8' x 2 with minG. Denied SOB, O2 desat to 84%. Increased to 3L to achieve sats > 88%. Pt returned to recliner with alarm set. No complaints. RN notified.    Kathee Delton Mobility Specialist 01/20/21, 11:15 AM

## 2021-01-20 NOTE — NC FL2 (Signed)
[place] LEVEL OF CARE SCREENING TOOL     IDENTIFICATION  Patient Name: Ryan Mcclure Birthdate: 02-Mar-1931 Sex: male Admission Date (Current Location): 01/16/2021  [facility] and [place] Number:  Engineering geologist and Address:  [facility], [address] [place], [place] [address]      Provider Number: [id number]  Attending Physician Name and Address:  Nita Sells, MD  Relative Name and Phone Number:  Stanton Kidney (sister) [phone]    Current Level of Care: Hospital Recommended Level of Care: [place] Prior Approval Number:    Date Approved/Denied:   PASRR Number: [id number]  Discharge Plan: SNF    Current Diagnoses: Patient Active Problem List   Diagnosis Date Noted   Acute CHF (congestive heart failure) ([place]) 01/20/2021   CKD stage 4 due to type 2 diabetes mellitus ([place]) 12/25/2019   History of GI diverticular bleed 12/25/2019   History of CVA (cerebrovascular accident) 12/25/2019   History of aortic valve replacement with bioprosthetic valve 12/25/2019   Cerebrovascular accident (CVA) due to occlusion of left posterior cerebral artery ([place]) 11/20/2017   Hyperlipidemia due to type 2 diabetes mellitus ([place]) 09/26/2017   Ulcer 09/26/2017   Stroke ([place]) 08/15/2017   Carotid stenosis 01/31/2016   Hematochezia    Diverticulosis of large intestine without hemorrhage    Hemorrhoid    GIB (gastrointestinal bleeding) 10/18/2015   Acute blood loss anemia 10/18/2015   CKD (chronic kidney disease), stage III (HCC)    Chronic systolic CHF (congestive heart failure) ([place])    Stroke (cerebrum) (HCC)    Cardiomyopathy, ischemic 10/12/2015   Shortness of breath 09/14/2015   Absolute anemia 06/10/2015   Cataract cortical, senile 06/10/2015   Controlled type 2 diabetes mellitus without complication ([place]) 97/67/3419   Acid reflux 06/10/2015   Combined fat and carbohydrate induced hyperlipemia 06/10/2015    Cerebrovascular accident (CVA) ([place]) 06/10/2015   Non-pressure chronic ulcer of skin of other sites with unspecified severity ([place]) 06/10/2015   Benign essential HTN 08/25/2014   Hypertension associated with diabetes ([place]) 08/25/2014   Arteriosclerosis of coronary artery 09/09/2013   CAD (coronary artery disease) 09/09/2013   Microalbuminuria 09/08/2013   Diabetes mellitus ([employer]) 09/08/2013    Orientation RESPIRATION BLADDER Height & Weight     Self, Time, Situation, Place  O2 (2L Nasal Cannula) Continent, External catheter Weight: 182 lb 1.6 oz (82.6 kg) Height:  5\' 7"  (170.2 cm)  BEHAVIORAL SYMPTOMS/MOOD NEUROLOGICAL BOWEL NUTRITION STATUS      Continent Diet (see discharge summary)  AMBULATORY STATUS COMMUNICATION OF NEEDS Skin   Limited Assist Verbally Normal                       Personal Care Assistance Level of Assistance  Bathing, Feeding, Dressing, Total care Bathing Assistance: Limited assistance Feeding assistance: Independent Dressing Assistance: Limited assistance Total Care Assistance: Limited assistance   Functional Limitations Info  Sight, Hearing, Speech Sight Info: Adequate Hearing Info: Adequate Speech Info: Adequate    SPECIAL CARE FACTORS FREQUENCY  PT (By licensed PT), OT (By licensed OT)     PT Frequency: min 4x weekly OT Frequency: min 4x weekly            Contractures Contractures Info: Not present    Additional Factors Info  Code Status, Allergies Code Status Info: full Allergies Info: neuromuscular blocking agents, other tongue/lips swelling with steroids, shrimp and shellfish allergy  Current Medications (01/20/2021):  This is the current hospital active medication list Current Facility-Administered Medications  Medication Dose Route Frequency Provider Last Rate Last Admin   acetaminophen (TYLENOL) tablet 650 mg  650 mg Oral Q6H PRN Mansy, Jan A, MD   650 mg at 01/19/21 1854   Or   acetaminophen (TYLENOL)  suppository 650 mg  650 mg Rectal Q6H PRN Mansy, Jan A, MD       amLODipine (NORVASC) tablet 5 mg  5 mg Oral BID Mansy, Jan A, MD   5 mg at 01/20/21 0902   atorvastatin (LIPITOR) tablet 40 mg  40 mg Oral Daily Mansy, Jan A, MD   40 mg at 01/20/21 0902   azithromycin (ZITHROMAX) 500 mg in sodium chloride 0.9 % 250 mL IVPB  500 mg Intravenous Q24H Mansy, Jan A, MD 250 mL/hr at 01/19/21 2136 500 mg at 01/19/21 2136   cefTRIAXone (ROCEPHIN) 2 g in sodium chloride 0.9 % 100 mL IVPB  2 g Intravenous Q24H Mansy, Jan A, MD 200 mL/hr at 01/19/21 2111 2 g at 01/19/21 2111   chlorpheniramine-HYDROcodone ([place]) 10-8 MG/5ML suspension 5 mL  5 mL Oral Q12H PRN Mansy, Jan A, MD   5 mL at 01/19/21 0115   clopidogrel (PLAVIX) tablet 75 mg  75 mg Oral QHS Mansy, Jan A, MD   75 mg at 01/19/21 2100   enoxaparin (LOVENOX) injection 30 mg  30 mg Subcutaneous Q24H Oswald Hillock, RPH   30 mg at 01/20/21 0902   ferrous sulfate tablet 325 mg  325 mg Oral TID WC Mansy, Jan A, MD   325 mg at 01/20/21 0902   fluticasone (FLONASE) 50 MCG/ACT nasal spray 1 spray  1 spray Each Nare Daily PRN Mansy, Jan A, MD       furosemide (LASIX) injection 20 mg  20 mg Intravenous Q12H Nita Sells, MD   20 mg at 01/20/21 1607   guaiFENesin (MUCINEX) 12 hr tablet 600 mg  600 mg Oral BID Mansy, Jan A, MD   600 mg at 01/20/21 0901   insulin aspart (novoLOG) injection 0-9 Units  0-9 Units Subcutaneous TID WC Nita Sells, MD   1 Units at 01/20/21 0903   insulin aspart (novoLOG) injection 2 Units  2 Units Subcutaneous TID WC Nita Sells, MD   2 Units at 01/20/21 0903   insulin glargine-yfgn (SEMGLEE) injection 24 Units  24 Units Subcutaneous QHS Nita Sells, MD   24 Units at 01/19/21 2104   ipratropium-albuterol (DUONEB) 0.5-2.5 (3) MG/3ML nebulizer solution 3 mL  3 mL Nebulization Q6H PRN Nita Sells, MD   3 mL at 01/20/21 0530   magnesium hydroxide (MILK OF MAGNESIA) suspension 30 mL  30 mL Oral  Daily PRN Mansy, Jan A, MD       metoprolol tartrate (LOPRESSOR) tablet 75 mg  75 mg Oral BID Tristan Schroeder, PA-C   75 mg at 01/20/21 0902   nitroGLYCERIN (NITROSTAT) SL tablet 0.4 mg  0.4 mg Sublingual Q5 min PRN Mansy, Jan A, MD       ondansetron [facility]) tablet 4 mg  4 mg Oral Q6H PRN Mansy, Jan A, MD       Or   ondansetron [facility]) injection 4 mg  4 mg Intravenous Q6H PRN Mansy, Jan A, MD       pantoprazole (PROTONIX) EC tablet 40 mg  40 mg Oral Daily Mansy, Jan A, MD   40 mg at 01/20/21 0902   traZODone (DESYREL) tablet 25 mg  25 mg Oral QHS PRN Mansy, Jan A, MD       vitamin B-12 (CYANOCOBALAMIN) tablet 1,000 mcg  1,000 mcg Oral Daily Mansy, Jan A, MD   1,000 mcg at 01/20/21 0902     Discharge Medications: Please see discharge summary for a list of discharge medications.  Relevant Imaging Results:  Relevant Lab Results:   Additional Information [id number]  Alberteen Sam, LCSW

## 2021-01-20 NOTE — Progress Notes (Signed)
Mobility Specialist - Progress Note   01/20/21 1400  Mobility  Activity Transferred:  Chair to bed  Level of Assistance Standby assist, set-up cues, supervision of patient - no hands on  Assistive Device Front wheel walker  Distance Ambulated (ft) 4 ft  Mobility Ambulated with assistance in room;Sit up in bed/chair position for meals  Mobility Response Tolerated well  Mobility performed by Mobility specialist  $Mobility charge 1 Mobility    Pt transferred chair-bed with minG, utilizing 2L. Pt took 2 steps towards HOB. Fatigued with transfer and voiced SOB---O2 desat to 84% with 90-second recovery and increase to 4L to achieve sats > 88%. RN notified and agreeable to leaving pt on 4L at this time. O2 90% at exit. Alarm set.    Ryan Mcclure Mobility Specialist 01/20/21, 2:59 PM

## 2021-01-20 NOTE — TOC Initial Note (Signed)
Transition of Care [facility]) - Initial/Assessment Note    Patient Details  Name: Ryan Mcclure MRN: [id number] Date of Birth: [birth year]  Transition of [facility]) CM/SW Contact:    Alberteen Sam, LCSW Phone Number: 01/20/2021, 11:56 AM  Clinical Narrative:                  CSW spoke with patient at bedside regarding SNF rec. Patient confirms he is agreeable to go to [facility], asks CSW to update his sister [name] on discharge planning. Stanton Kidney has been updated.   TOC to continue to follow for patient's discharge readiness to go to [facility]. Expected Discharge Plan: Skilled Nursing Facility Barriers to Discharge: Continued Medical Work up   Patient Goals and CMS Choice Patient states their goals for this hospitalization and ongoing recovery are:: to go home CMS Medicare.gov Compare Post Acute Care list provided to:: Patient Choice offered to / list presented to : Patient  Expected Discharge Plan and Services Expected Discharge Plan: [place]       Living arrangements for the past 2 months: [facility])                                      Prior Living Arrangements/Services Living arrangements for the past 2 months: [place] ([place]) Lives with:: Facility Resident                   Activities of Daily Living Home Assistive Devices/Equipment: Environmental consultant (specify type) ADL Screening (condition at time of admission) Patient's cognitive ability adequate to safely complete daily activities?: No Is the patient deaf or have difficulty hearing?: No Does the patient have difficulty seeing, even when wearing glasses/contacts?: Yes Does the patient have difficulty concentrating, remembering, or making decisions?: No Patient able to express need for assistance with ADLs?: Yes Does the patient have difficulty dressing or bathing?: Yes Independently performs ADLs?: Yes (appropriate for developmental  age) Does the patient have difficulty walking or climbing stairs?: Yes Weakness of Legs: Both Weakness of Arms/Hands: None  Permission Sought/Granted                  Emotional Assessment       Orientation: : Oriented to Self, Oriented to Place, Oriented to  Time, Oriented to Situation Alcohol / Substance Use: Not Applicable Psych Involvement: No (comment)  Admission diagnosis:  Acute CHF (congestive heart failure) (HCC) [I50.9] Acute midline low back pain without sciatica [M54.50] Community acquired pneumonia, unspecified laterality [J18.9] Acute congestive heart failure, unspecified heart failure type ([place]) [I50.9] Sepsis, due to unspecified organism, unspecified whether acute organ dysfunction present [facility]) [A41.9] Patient Active Problem List   Diagnosis Date Noted   Acute CHF (congestive heart failure) ([place]) 01/16/2021   CKD stage 4 due to type 2 diabetes mellitus ([place]) 12/25/2019   History of GI diverticular bleed 12/25/2019   History of CVA (cerebrovascular accident) 12/25/2019   History of aortic valve replacement with bioprosthetic valve 12/25/2019   Cerebrovascular accident (CVA) due to occlusion of left posterior cerebral artery ([place]) 11/20/2017   Hyperlipidemia due to type 2 diabetes mellitus ([place]) 09/26/2017   Ulcer 09/26/2017   Stroke ([place]) 08/15/2017   Carotid stenosis 01/31/2016   Hematochezia    Diverticulosis of large intestine without hemorrhage    Hemorrhoid    GIB (gastrointestinal bleeding) 10/18/2015   Acute  blood loss anemia 10/18/2015   CKD (chronic kidney disease), stage III (HCC)    Chronic systolic CHF (congestive heart failure) ([place] )    Stroke (cerebrum) ([place])    Cardiomyopathy, ischemic 10/12/2015   Shortness of breath 09/14/2015   Absolute anemia 06/10/2015   Cataract cortical, senile 06/10/2015   Controlled type 2 diabetes mellitus without complication ([place]) 61/60/7371   Acid reflux 06/10/2015   Combined fat and carbohydrate induced  hyperlipemia 06/10/2015   Cerebrovascular accident (CVA) ([place]) 06/10/2015   Non-pressure chronic ulcer of skin of other sites with unspecified severity ([place]) 06/10/2015   Benign essential HTN 08/25/2014   Hypertension associated with diabetes ([place]) 08/25/2014   Arteriosclerosis of coronary artery 09/09/2013   CAD (coronary artery disease) 09/09/2013   Microalbuminuria 09/08/2013   Diabetes mellitus ([place]) 09/08/2013   PCP:  [place] Pink, MD Pharmacy:   [facility] [place], [place] - [place] [address] [place] [address] [place] [address] Phone: [phone] Fax: [place], [place] [address] Phone: [phone] Fax: [phone]     Social Determinants of Health (SDOH) Interventions    Readmission Risk Interventions No flowsheet data found.

## 2021-01-20 NOTE — Care Management Important Message (Signed)
Important Message  Patient Details  Name: Ryan Mcclure MRN: [id number] Date of Birth: [birth year]   Medicare Important Message Given:  Yes     [name] 01/20/2021, 2:12 PM

## 2021-01-20 NOTE — Progress Notes (Signed)
°   01/20/21 1934  Assess: MEWS Score  Temp 97.8 F (36.6 C)  BP 133/72  Pulse Rate (!) 119  SpO2 95 %  O2 Device Nasal Cannula  Assess: MEWS Score  MEWS Temp 0  MEWS Systolic 0  MEWS Pulse 2  MEWS RR 0  MEWS LOC 0  MEWS Score 2  MEWS Score Color Yellow  Treat  Pain Score 0  Take Vital Signs  Increase Vital Sign Frequency  Yellow: Q 2hr X 2 then Q 4hr X 2, if remains yellow, continue Q 4hrs  Escalate  MEWS: Escalate Yellow: discuss with charge nurse/RN and consider discussing with provider and RRT  Notify: Provider  Provider Name/Title Sharion Settler NP  Date Provider Notified 01/20/21  Time Provider Notified 2323  Notification Type Page  Notification Reason Change in status  Provider response No new orders (continue to monitor.)  Date of Provider Response 01/20/21  Time of Provider Response 2320  Assess: SIRS CRITERIA  SIRS Temperature  0  SIRS Pulse 1  SIRS Respirations  0  SIRS WBC 0  SIRS Score Sum  1

## 2021-01-20 NOTE — Progress Notes (Signed)
Pt HR sustaining at 119 but no PRN medicine ordered. NP Randol Kern made aware. NP Randol Kern instructed RN to notify NP if HR sustain at 125-130 to notify NP. Will continue to monitor.

## 2021-01-20 NOTE — Progress Notes (Addendum)
PROGRESS NOTE   Ryan [facility]  YJE:[id number] DOB: [birth year] DOA: 01/18/2021 PCP: Ryan Pink, MD  Brief Narrative:   87 year old white male [facility] resident HFrEF, CKD 4, DM TY 2, reflux, HLD, CVA with carotid endarterectomy, AoV replacement bioprosthetic valve CABG and stent August 2012 followed at [facility] Admission for GI bleed 12/25/2019-5 mm polyp present at that time-prior GI bleed 2017   Presented 12/22 [facility] ED low back pain in the setting of mild cough X 3 weeks-found to have oxygen 87% on room air and started on oxygen, T-max 100.5 mildly hypotensive CXR 12/20 mild vascular congestion CT chest atelectasis lung bases no focal pneumonia no effusion Sodium 138 BUN/creatinine 45/2.6 (baseline 56/2.66), BNP 1351, troponin 45 White count 14.3-->13.7 hemoglobin 10.6-->9.5   Code sepsis called concerning admission for acute superimposed on chronic systolic heart failure in addition to probable right-sided community-acquired pneumonia versus aspiration pneumonia   Awaiting resolution of kidney function and  likely can go to [facility] at [facility] based course  Multifactorial respiratory failure Right-sided pneumonia IV ceftriaxone/azithromycin-->Omnicef 300 daily till 1226 SLP  clears the patient for regular diet Wean oxygen  Acute decompensated heart failure EF 45-50% on 12/21 Atrial valve bioprosthetic replacement, systemic 08/2010 I/os probably inaccurate--enforce fluid restriction Baseline weight 95?? kg-->currently 82  hold Lasix today given rising creatinine  Myocardis 80 held, Imdur 30  held Continue Plavix 75 daily, atorvastatin 40 PVCs/SVT Not felt to be afib--on monitors today predominant NSR with PVC Cardiology recommend up titration Lopressor 75 twice daily Replace  magnesium 400 twice daily Prior GI bleed 2021, 2017 secondary to polyps Clinically stable at this time-continue Protonix 40 daily Diabetes mellitus type 2-usually on metformin +  insulin at home-A1c was 6.4 CBGs 130-160 25-50% of meals, continuing Lantus to 24 units CKD 4 Rising creatinine therefore hold Lasix Repeat labs in a.m. Sees Dr. Juleen [facility] in OP setting--OP f/u HTN Continue amlodipine 5 twice daily Right upper lobe nodule 1.2 cm Requires outpatient CT in 3 to 6 months   DVT prophylaxis: lovenox Code Status: Full Family Communication: Called and discussed with the patient's Sister Ryan [facility] [phone] Disposition:  Status is: Inpatient  Remains inpatient appropriate because: still on abx and need freq labs     Consultants:  Cardiology   Procedures: n  Antimicrobials:   Azithromycin and ceftriaxone from 12/21  Subjective:  Overall looks well no distress no chest pain no fever some cough Feels less swollen in lower extremities  Objective: Vitals:   01/20/21 0425 01/20/21 0730 01/20/21 0908 01/20/21 1150  BP: (!) 142/52  (!) 150/78 (!) 134/46  Pulse: 70  91 71  Resp: 20  17 19   Temp: 98.8 F (37.1 C) 99.2 F (37.3 C)  99.1 F (37.3 C)  TempSrc:      SpO2: 92%  94% 94%  Weight: 82.6 kg     Height:        Intake/Output Summary (Last 24 hours) at 01/20/2021 1353 Last data filed at 01/20/2021 0500 Gross per 24 hour  Intake 540.51 ml  Output 2325 ml  Net -1784.49 ml    Filed Weights   01/19/21 0400 01/19/21 1000 01/20/21 0425  Weight: 75.4 kg 88.6 kg 82.6 kg    Examination:  EOMI NCAT no focal deficit No JVD noted at bedside S1-S2 PVCs on monitors Decreased air entry posterolateral lung fields again predominantly now on left side with some crackles Abdomen is soft nontender Looks a little swollen 1 + edema which is  improved from prior Neurologically intact  Data Reviewed: personally reviewed   CBC    Component Value Date/Time   WBC 13.2 (H) 01/19/2021 0636   RBC 3.08 (L) 01/19/2021 0636   HGB 8.2 (L) 01/19/2021 0636   HGB 11.6 (L) 10/10/2012 0830   HCT 25.7 (L) 01/19/2021 0636   HCT 35.7 (L) 10/10/2012 0830    PLT 119 (L) 01/19/2021 0636   PLT 108 (L) 10/10/2012 0830   MCV 83.4 01/19/2021 0636   MCV 79 (L) 10/10/2012 0830   MCH 26.6 01/19/2021 0636   MCHC 31.9 01/19/2021 0636   RDW 14.4 01/19/2021 0636   RDW 15.5 (H) 10/10/2012 0830   LYMPHSABS 0.9 01/19/2021 0636   LYMPHSABS 1.5 10/10/2012 0830   MONOABS 1.5 (H) 01/19/2021 0636   MONOABS 0.9 10/10/2012 0830   EOSABS 0.1 01/19/2021 0636   EOSABS 0.3 10/10/2012 0830   BASOSABS 0.0 01/19/2021 0636   BASOSABS 0.1 10/10/2012 0830   CMP Latest Ref Rng & Units 01/20/2021 01/19/2021 01/18/2021  Glucose 70 - 99 mg/dL 150(H) 211(H) 214(H)  BUN 8 - 23 mg/dL 49(H) 46(H) 45(H)  Creatinine 0.61 - 1.24 mg/dL 3.26(H) 2.99(H) 2.67(H)  Sodium 135 - 145 mmol/L 139 138 138  Potassium 3.5 - 5.1 mmol/L 3.5 3.4(L) 3.5  Chloride 98 - 111 mmol/L 107 108 107  CO2 22 - 32 mmol/L 23 23 24   Calcium 8.9 - 10.3 mg/dL 9.3 8.8(L) 8.9  Total Protein 6.5 - 8.1 g/dL - 6.5 -  Total Bilirubin 0.3 - 1.2 mg/dL - 0.8 -  Alkaline Phos 38 - 126 U/L - 59 -  AST 15 - 41 U/L - 16 -  ALT 0 - 44 U/L - 12 -     Radiology Studies: ECHOCARDIOGRAM COMPLETE  Result Date: 01/19/2021    ECHOCARDIOGRAM REPORT   Patient Name:   [name] Date of Exam: 01/18/2021 Medical Rec #:  [id number]      Height:       67.0 in Accession #:    [id number]     Weight:       165.3 lb Date of Birth:  1931/07/31      BSA:          1.865 m Patient Age:    61 years       BP:           134/68 mmHg Patient Gender: M              HR:           73 bpm. Exam Location:  [facility] Procedure: 2D Echo, Color Doppler and Cardiac Doppler Indications:     I50.31 congestive heart failure-Acute Diastolic  History:         Patient has prior history of Echocardiogram examinations. CHF,                  Prior CABG, CKD, stage III and Stroke; Risk Factors:Diabetes                  and HCL.  Sonographer:     Charmayne Sheer Referring Phys:  [id number] [place] Diagnosing Phys: Ryan Cowman MD IMPRESSIONS  1. Left  ventricular ejection fraction, by estimation, is 45 to 50%. The left ventricle has mildly decreased function. The left ventricle has no regional wall motion abnormalities. Left ventricular diastolic parameters were normal.  2. Right ventricular systolic function is normal. The right ventricular size is normal.  3. Left atrial size was  mildly dilated.  4. The mitral valve is normal in structure. Mild mitral valve regurgitation. No evidence of mitral stenosis.  5. The aortic valve is normal in structure. Aortic valve regurgitation is not visualized. Aortic valve sclerosis is present, with no evidence of aortic valve stenosis.  6. The inferior vena cava is normal in size with greater than 50% respiratory variability, suggesting right atrial pressure of 3 mmHg. FINDINGS  Left Ventricle: Left ventricular ejection fraction, by estimation, is 45 to 50%. The left ventricle has mildly decreased function. The left ventricle has no regional wall motion abnormalities. The left ventricular internal cavity size was normal in size. There is no left ventricular hypertrophy. Left ventricular diastolic parameters were normal. Right Ventricle: The right ventricular size is normal. No increase in right ventricular wall thickness. Right ventricular systolic function is normal. Left Atrium: Left atrial size was mildly dilated. Right Atrium: Right atrial size was normal in size. Pericardium: There is no evidence of pericardial effusion. Mitral Valve: The mitral valve is normal in structure. Mild mitral valve regurgitation. No evidence of mitral valve stenosis. MV peak gradient, 16.6 mmHg. The mean mitral valve gradient is 7.0 mmHg. Tricuspid Valve: The tricuspid valve is normal in structure. Tricuspid valve regurgitation is mild . No evidence of tricuspid stenosis. Aortic Valve: The aortic valve is normal in structure. Aortic valve regurgitation is not visualized. Aortic valve sclerosis is present, with no evidence of aortic valve  stenosis. Aortic valve mean gradient measures 14.5 mmHg. Aortic valve peak gradient measures 25.2 mmHg. Aortic valve area, by VTI measures 2.91 cm. Pulmonic Valve: The pulmonic valve was normal in structure. Pulmonic valve regurgitation is not visualized. No evidence of pulmonic stenosis. Aorta: The aortic root is normal in size and structure. Venous: The inferior vena cava is normal in size with greater than 50% respiratory variability, suggesting right atrial pressure of 3 mmHg. IAS/Shunts: No atrial level shunt detected by color flow Doppler.  LEFT VENTRICLE PLAX 2D LVIDd:         3.95 cm   Diastology LVIDs:         3.09 cm   LV e' medial:    4.57 cm/s LV PW:         1.05 cm   LV E/e' medial:  37.6 LV IVS:        1.01 cm   LV e' lateral:   6.53 cm/s LVOT diam:     2.10 cm   LV E/e' lateral: 26.3 LV SV:         147 LV SV Index:   79 LVOT Area:     3.46 cm  RIGHT VENTRICLE RV Basal diam:  3.81 cm RV S prime:     6.31 cm/s LEFT ATRIUM             Index        RIGHT ATRIUM           Index LA diam:        4.10 cm 2.20 cm/m   RA Area:     19.80 cm LA Vol (A2C):   71.1 ml 38.12 ml/m  RA Volume:   56.30 ml  30.19 ml/m LA Vol (A4C):   90.2 ml 48.36 ml/m LA Biplane Vol: 81.7 ml 43.80 ml/m  AORTIC VALVE                     PULMONIC VALVE AV Area (Vmax):    3.01 cm      PV  Vmax:       0.88 m/s AV Area (Vmean):   2.70 cm      PV Vmean:      59.000 cm/s AV Area (VTI):     2.91 cm      PV VTI:        0.185 m AV Vmax:           251.00 cm/s   PV Peak grad:  3.1 mmHg AV Vmean:          175.500 cm/s  PV Mean grad:  2.0 mmHg AV VTI:            0.503 m AV Peak Grad:      25.2 mmHg AV Mean Grad:      14.5 mmHg LVOT Vmax:         218.00 cm/s LVOT Vmean:        137.000 cm/s LVOT VTI:          0.423 m LVOT/AV VTI ratio: 0.84  AORTA Ao Root diam: 2.50 cm MITRAL VALVE MV Area (PHT): 3.02 cm     SHUNTS MV Area VTI:   2.64 cm     Systemic VTI:  0.42 m MV Peak grad:  16.6 mmHg    Systemic Diam: 2.10 cm MV Mean grad:  7.0 mmHg MV  Vmax:       2.04 m/s MV Vmean:      126.0 cm/s MV Decel Time: 251 msec MV E velocity: 172.00 cm/s MV A velocity: 159.00 cm/s MV E/A ratio:  1.08 Ryan Cowman MD Electronically signed by Ryan Cowman MD Signature Date/Time: 01/19/2021/8:39:50 AM    Final      Scheduled Meds:  amLODipine  5 mg Oral BID   atorvastatin  40 mg Oral Daily   cefdinir  300 mg Oral Q24H   clopidogrel  75 mg Oral QHS   enoxaparin (LOVENOX) injection  30 mg Subcutaneous Q24H   ferrous sulfate  325 mg Oral TID WC   guaiFENesin  600 mg Oral BID   insulin aspart  0-9 Units Subcutaneous TID WC   insulin aspart  2 Units Subcutaneous TID WC   insulin glargine-yfgn  24 Units Subcutaneous QHS   metoprolol tartrate  75 mg Oral BID   pantoprazole  40 mg Oral Daily   vitamin B-12  1,000 mcg Oral Daily   Continuous Infusions:     LOS: 3 days   Time spent: 27  Nita Sells, MD [facility] To contact the attending provider between 7A-7P or the covering provider during after hours 7P-7A, please log into the web site [email or web address] and access using universal [place] password for that web site. If you do not have the password, please call the [facility] operator.  01/20/2021, 1:53 PM

## 2021-01-20 NOTE — Progress Notes (Signed)
[facility] Cardiology    SUBJECTIVE: Patient resting comfortably improved shortness of breath still wearing oxygen denies palpitations or tachycardia no chest pain no leg edema.   Vitals:   01/20/21 0425 01/20/21 0730 01/20/21 0908 01/20/21 1150  BP: (!) 142/52  (!) 150/78 (!) 134/46  Pulse: 70  91 71  Resp: 20  17 19   Temp: 98.8 F (37.1 C) 99.2 F (37.3 C)  99.1 F (37.3 C)  TempSrc:      SpO2: 92%  94% 94%  Weight: 82.6 kg     Height:         Intake/Output Summary (Last 24 hours) at 01/20/2021 1422 Last data filed at 01/20/2021 0500 Gross per 24 hour  Intake 540.51 ml  Output 2325 ml  Net -1784.49 ml      PHYSICAL EXAM  General: Well developed, well nourished, in no acute distress HEENT:  Normocephalic and atramatic Neck:  No JVD.  Lungs: Clear bilaterally to auscultation and percussion. Heart: HRRR . Normal S1 and S2 without gallops or murmurs.  Abdomen: Bowel sounds are positive, abdomen soft and non-tender  Msk:  Back normal, normal gait. Normal strength and tone for age. Extremities: No clubbing, cyanosis or edema.   Neuro: Alert and oriented X 3. Psych:  Good affect, responds appropriately   LABS: Basic Metabolic Panel: Recent Labs    01/19/21 0636 01/20/21 0454 01/20/21 0855  NA 138  --  139  K 3.4*  --  3.5  CL 108  --  107  CO2 23  --  23  GLUCOSE 211*  --  150*  BUN 46*  --  49*  CREATININE 2.99*  --  3.26*  CALCIUM 8.8*  --  9.3  MG 1.6* 1.7  --    Liver Function Tests: Recent Labs    01/09/2021 2051 01/19/21 0636  AST 17 16  ALT 12 12  ALKPHOS 77 59  BILITOT 0.9 0.8  PROT 8.2* 6.5  ALBUMIN 3.5 2.7*   No results for input(s): LIPASE, AMYLASE in the last 72 hours. CBC: Recent Labs    01/15/2021 2051 01/18/21 0324 01/19/21 0636  WBC 14.3* 13.7* 13.2*  NEUTROABS 11.1*  --  10.6*  HGB 10.6* 9.5* 8.2*  HCT 33.6* 30.3* 25.7*  MCV 83.8 83.9 83.4  PLT 139* 125* 119*   Cardiac Enzymes: No results for input(s): CKTOTAL, CKMB,  CKMBINDEX, TROPONINI in the last 72 hours. BNP: Invalid input(s): POCBNP D-Dimer: No results for input(s): DDIMER in the last 72 hours. Hemoglobin A1C: Recent Labs    01/18/21 0810  HGBA1C 6.4*   Fasting Lipid Panel: No results for input(s): CHOL, HDL, LDLCALC, TRIG, CHOLHDL, LDLDIRECT in the last 72 hours. Thyroid Function Tests: No results for input(s): TSH, T4TOTAL, T3FREE, THYROIDAB in the last 72 hours.  Invalid input(s): FREET3 Anemia Panel: No results for input(s): VITAMINB12, FOLATE, FERRITIN, TIBC, IRON, RETICCTPCT in the last 72 hours.  ECHOCARDIOGRAM COMPLETE  Result Date: 01/19/2021    ECHOCARDIOGRAM REPORT   Patient Name:   [facility] Date of Exam: 01/18/2021 Medical Rec #:  [id number]      Height:       67.0 in Accession #:    [id number]     Weight:       165.3 lb Date of Birth:  [birth year]      BSA:          1.865 m Patient Age:    87 years       BP:  134/68 mmHg Patient Gender: M              HR:           73 bpm. Exam Location:  [facility] Procedure: 2D Echo, Color Doppler and Cardiac Doppler Indications:     I50.31 congestive heart failure-Acute Diastolic  History:         Patient has prior history of Echocardiogram examinations. CHF,                  Prior CABG, CKD, stage III and Stroke; Risk Factors:Diabetes                  and HCL.  Sonographer:     Charmayne Sheer Referring Phys:  [id number] [place] Diagnosing Phys: Isaias Cowman MD IMPRESSIONS  1. Left ventricular ejection fraction, by estimation, is 45 to 50%. The left ventricle has mildly decreased function. The left ventricle has no regional wall motion abnormalities. Left ventricular diastolic parameters were normal.  2. Right ventricular systolic function is normal. The right ventricular size is normal.  3. Left atrial size was mildly dilated.  4. The mitral valve is normal in structure. Mild mitral valve regurgitation. No evidence of mitral stenosis.  5. The aortic valve is normal in structure. Aortic  valve regurgitation is not visualized. Aortic valve sclerosis is present, with no evidence of aortic valve stenosis.  6. The inferior vena cava is normal in size with greater than 50% respiratory variability, suggesting right atrial pressure of 3 mmHg. FINDINGS  Left Ventricle: Left ventricular ejection fraction, by estimation, is 45 to 50%. The left ventricle has mildly decreased function. The left ventricle has no regional wall motion abnormalities. The left ventricular internal cavity size was normal in size. There is no left ventricular hypertrophy. Left ventricular diastolic parameters were normal. Right Ventricle: The right ventricular size is normal. No increase in right ventricular wall thickness. Right ventricular systolic function is normal. Left Atrium: Left atrial size was mildly dilated. Right Atrium: Right atrial size was normal in size. Pericardium: There is no evidence of pericardial effusion. Mitral Valve: The mitral valve is normal in structure. Mild mitral valve regurgitation. No evidence of mitral valve stenosis. MV peak gradient, 16.6 mmHg. The mean mitral valve gradient is 7.0 mmHg. Tricuspid Valve: The tricuspid valve is normal in structure. Tricuspid valve regurgitation is mild . No evidence of tricuspid stenosis. Aortic Valve: The aortic valve is normal in structure. Aortic valve regurgitation is not visualized. Aortic valve sclerosis is present, with no evidence of aortic valve stenosis. Aortic valve mean gradient measures 14.5 mmHg. Aortic valve peak gradient measures 25.2 mmHg. Aortic valve area, by VTI measures 2.91 cm. Pulmonic Valve: The pulmonic valve was normal in structure. Pulmonic valve regurgitation is not visualized. No evidence of pulmonic stenosis. Aorta: The aortic root is normal in size and structure. Venous: The inferior vena cava is normal in size with greater than 50% respiratory variability, suggesting right atrial pressure of 3 mmHg. IAS/Shunts: No atrial level shunt  detected by color flow Doppler.  LEFT VENTRICLE PLAX 2D LVIDd:         3.95 cm   Diastology LVIDs:         3.09 cm   LV e' medial:    4.57 cm/s LV PW:         1.05 cm   LV E/e' medial:  37.6 LV IVS:        1.01 cm   LV e' lateral:  6.53 cm/s LVOT diam:     2.10 cm   LV E/e' lateral: 26.3 LV SV:         147 LV SV Index:   79 LVOT Area:     3.46 cm  RIGHT VENTRICLE RV Basal diam:  3.81 cm RV S prime:     6.31 cm/s LEFT ATRIUM             Index        RIGHT ATRIUM           Index LA diam:        4.10 cm 2.20 cm/m   RA Area:     19.80 cm LA Vol (A2C):   71.1 ml 38.12 ml/m  RA Volume:   56.30 ml  30.19 ml/m LA Vol (A4C):   90.2 ml 48.36 ml/m LA Biplane Vol: 81.7 ml 43.80 ml/m  AORTIC VALVE                     PULMONIC VALVE AV Area (Vmax):    3.01 cm      PV Vmax:       0.88 m/s AV Area (Vmean):   2.70 cm      PV Vmean:      59.000 cm/s AV Area (VTI):     2.91 cm      PV VTI:        0.185 m AV Vmax:           251.00 cm/s   PV Peak grad:  3.1 mmHg AV Vmean:          175.500 cm/s  PV Mean grad:  2.0 mmHg AV VTI:            0.503 m AV Peak Grad:      25.2 mmHg AV Mean Grad:      14.5 mmHg LVOT Vmax:         218.00 cm/s LVOT Vmean:        137.000 cm/s LVOT VTI:          0.423 m LVOT/AV VTI ratio: 0.84  AORTA Ao Root diam: 2.50 cm MITRAL VALVE MV Area (PHT): 3.02 cm     SHUNTS MV Area VTI:   2.64 cm     Systemic VTI:  0.42 m MV Peak grad:  16.6 mmHg    Systemic Diam: 2.10 cm MV Mean grad:  7.0 mmHg MV Vmax:       2.04 m/s MV Vmean:      126.0 cm/s MV Decel Time: 251 msec MV E velocity: 172.00 cm/s MV A velocity: 159.00 cm/s MV E/A ratio:  1.08 Isaias Cowman MD Electronically signed by Isaias Cowman MD Signature Date/Time: 01/19/2021/8:39:50 AM    Final      Echo mildly depressed left ventricular function 45 to 50%  TELEMETRY: Normal sinus rhythm rate of around 80 nonspecific ST-T changes:  ASSESSMENT AND PLAN:  Principal Problem:   Acute CHF (congestive heart failure) (HCC) Abnormal  EKG Atrial fibrillation Acute on chronic congestive heart failure Hypertension Chronic renal insufficiency  stage IV Diabetes Hyperlipidemia  Plan Continue current therapy for respiratory failure and congestive heart failure Agree with continued diuretic therapy with IV Lasix Significant blood pressure control with metoprolol Maintain low-salt diet Consider spironolactone for treatment of heart failure Recommend nephrology input for renal insufficiency Blood pressure control amlodipine spironolactone beta-blockade Continue diabetes management and control Sater sleep study for evaluation of possible sleep apnea CPAP if indicated weight  loss possible obstructive Continue supplemental oxygen for hypoxemia   Yolonda Kida, MD 01/20/2021 2:22 PM

## 2021-01-21 DIAGNOSIS — I50811 Acute right heart failure: Secondary | ICD-10-CM | POA: Diagnosis not present

## 2021-01-21 LAB — CBC
HCT: 29.6 % — ABNORMAL LOW (ref 39.0–52.0)
Hemoglobin: 9.1 g/dL — ABNORMAL LOW (ref 13.0–17.0)
MCH: 26.2 pg (ref 26.0–34.0)
MCHC: 30.7 g/dL (ref 30.0–36.0)
MCV: 85.3 fL (ref 80.0–100.0)
Platelets: 155 10*3/uL (ref 150–400)
RBC: 3.47 MIL/uL — ABNORMAL LOW (ref 4.22–5.81)
RDW: 14.6 % (ref 11.5–15.5)
WBC: 13.8 10*3/uL — ABNORMAL HIGH (ref 4.0–10.5)
nRBC: 0 % (ref 0.0–0.2)

## 2021-01-21 LAB — MAGNESIUM: Magnesium: 2.4 mg/dL (ref 1.7–2.4)

## 2021-01-21 LAB — BASIC METABOLIC PANEL
Anion gap: 8 (ref 5–15)
BUN: 57 mg/dL — ABNORMAL HIGH (ref 8–23)
CO2: 25 mmol/L (ref 22–32)
Calcium: 9.7 mg/dL (ref 8.9–10.3)
Chloride: 103 mmol/L (ref 98–111)
Creatinine, Ser: 3.06 mg/dL — ABNORMAL HIGH (ref 0.61–1.24)
GFR, Estimated: 19 mL/min — ABNORMAL LOW (ref >60)
Glucose, Bld: 305 mg/dL — ABNORMAL HIGH (ref 70–99)
Potassium: 4.3 mmol/L (ref 3.5–5.1)
Sodium: 136 mmol/L (ref 135–145)

## 2021-01-21 LAB — GLUCOSE, CAPILLARY
Glucose-Capillary: 229 mg/dL — ABNORMAL HIGH (ref 70–99)
Glucose-Capillary: 285 mg/dL — ABNORMAL HIGH (ref 70–99)
Glucose-Capillary: 190 mg/dL — ABNORMAL HIGH (ref 70–99)
Glucose-Capillary: 182 mg/dL — ABNORMAL HIGH (ref 70–99)

## 2021-01-21 LAB — SARS CORONAVIRUS 2 (TAT 6-24 HRS): SARS Coronavirus 2: NEGATIVE

## 2021-01-21 MED ORDER — METOPROLOL TARTRATE 50 MG PO TABS
100.0000 mg | ORAL_TABLET | Freq: Two times a day (BID) | ORAL | Status: DC
  Administered 2021-01-21 – 2021-01-22 (×3): 100 mg via ORAL
  Filled 2021-01-21 (×4): qty 2

## 2021-01-21 NOTE — Progress Notes (Signed)
[facility] Cardiology    SUBJECTIVE: Patient still feels somewhat weak fatigue reduced shortness of breath minimal palpitations tachycardia no chest pain denies fever chills or sweats   Vitals:   01/21/21 0037 01/21/21 0243 01/21/21 0326 01/21/21 0753  BP: 132/72  (!) 133/97 120/61  Pulse:    (!) 120  Resp: 18   20  Temp: 98.3 F (36.8 C)  98.6 F (37 C) 97.8 F (36.6 C)  TempSrc: Oral     SpO2: 99%  95% 93%  Weight: 93.2 kg 93.2 kg    Height:         Intake/Output Summary (Last 24 hours) at 01/21/2021 1029 Last data filed at 01/21/2021 0500 Gross per 24 hour  Intake 600 ml  Output 1000 ml  Net -400 ml      PHYSICAL EXAM  General: Well developed, well nourished, in no acute distress HEENT:  Normocephalic and atramatic Neck:  No JVD.  Lungs: Clear bilaterally to auscultation and percussion. Heart: Tachycardia normal S1 and S2 without gallops or murmurs.  Abdomen: Bowel sounds are positive, abdomen soft and non-tender  Msk:  Back normal, normal gait. Normal strength and tone for age. Extremities: No clubbing, cyanosis or edema.   Neuro: Alert and oriented X 3. Psych:  Good affect, responds appropriately   LABS: Basic Metabolic Panel: Recent Labs    01/19/21 0636 01/20/21 0454 01/20/21 0855  NA 138  --  139  K 3.4*  --  3.5  CL 108  --  107  CO2 23  --  23  GLUCOSE 211*  --  150*  BUN 46*  --  49*  CREATININE 2.99*  --  3.26*  CALCIUM 8.8*  --  9.3  MG 1.6* 1.7  --    Liver Function Tests: Recent Labs    01/19/21 0636  AST 16  ALT 12  ALKPHOS 59  BILITOT 0.8  PROT 6.5  ALBUMIN 2.7*   No results for input(s): LIPASE, AMYLASE in the last 72 hours. CBC: Recent Labs    01/19/21 0636  WBC 13.2*  NEUTROABS 10.6*  HGB 8.2*  HCT 25.7*  MCV 83.4  PLT 119*   Cardiac Enzymes: No results for input(s): CKTOTAL, CKMB, CKMBINDEX, TROPONINI in the last 72 hours. BNP: Invalid input(s): POCBNP D-Dimer: No results for input(s): DDIMER in the last 72  hours. Hemoglobin A1C: No results for input(s): HGBA1C in the last 72 hours. Fasting Lipid Panel: No results for input(s): CHOL, HDL, LDLCALC, TRIG, CHOLHDL, LDLDIRECT in the last 72 hours. Thyroid Function Tests: No results for input(s): TSH, T4TOTAL, T3FREE, THYROIDAB in the last 72 hours.  Invalid input(s): FREET3 Anemia Panel: No results for input(s): VITAMINB12, FOLATE, FERRITIN, TIBC, IRON, RETICCTPCT in the last 72 hours.  No results found.   Echo moderate depressed left ventricular function 45 to 50% bioprosthetic aortic valve  TELEMETRY: Atrial fibrillation with ventricular response:  ASSESSMENT AND PLAN:  Principal Problem:   Acute CHF (congestive heart failure) (HCC) Diabetes type 2 Congestive heart failure systolic dysfunction Peripheral vascular disease Coronary bypass surgery History of GI bleeding Mild obesity Chronic renal insufficiency stage IV Aortic valve replacement Hypoxic respiratory failure Possible right-sided pneumonia Hypertension   Plan Continue supplemental oxygen as necessary Diuretics to help with edema fluid retention Inhalers may be helpful for mild congestion Recommend nephrology involvement for renal insufficiency Follow-up H&H's for possible GI bleeding Agree with broad-spectrum antibiotic therapy as necessary for possible pneumonia Recommend physical therapy for strength training and mobility Continue diabetes  management and control   Yolonda Kida, MD 01/21/2021 10:29 AM

## 2021-01-21 NOTE — TOC Progression Note (Signed)
Transition of Care [facility]) - Progression Note    Patient Details  Name: TASHAUN OBEY MRN: [id number] Date of Birth: [birth year]  Transition of Care [facility]) CM/SW [place], [place] Phone Number: 01/21/2021, 10:33 AM  Clinical Narrative:     CSW updated Seth Bake at [facility] that per MD patient not medically stable today.   TOC continuing to follow for patient's medical readiness to dc to [facility].  Expected Discharge Plan: [place] Barriers to Discharge: Continued Medical Work up  Expected Discharge Plan and Services Expected Discharge Plan: [place] arrangements for the past 2 months: [place] [facility])                                       Social Determinants of Health (SDOH) Interventions    Readmission Risk Interventions No flowsheet data found.

## 2021-01-21 NOTE — Progress Notes (Signed)
PT Cancellation Note  Patient Details Name: Ryan [facility] MRN: [id number] DOB: August 07, 1931   Cancelled Treatment:    Reason Eval/Treat Not Completed: Patient declined, no reason specified Patient refused therapy despite max encouragement. Will re-attempt at a later time/date as available and patient medically appropriate for PT. Thank you!    Iva Boop, PT  01/21/21. 3:09 PM

## 2021-01-21 NOTE — Progress Notes (Signed)
PROGRESS NOTE   Ryan Mcclure  JXB:[id number] DOB: [birth year] DOA: 01/04/2021 PCP: [place] Pink, MD  Brief Narrative:   87 year old white male [place] resident HFrEF, CKD 4, DM TY 2, reflux, HLD, CVA with carotid endarterectomy, AoV replacement bioprosthetic valve CABG and stent August 2012 followed at [facility] Admission for GI bleed 12/25/2019-5 mm polyp present at that time-prior GI bleed 2017   Presented 12/22 [facility] ED low back pain in the setting of mild cough X 3 weeks-found to have oxygen 87% on room air and started on oxygen, T-max 100.5 mildly hypotensive CXR 12/20 mild vascular congestion CT chest atelectasis lung bases no focal pneumonia no effusion Sodium 138 BUN/creatinine 45/2.6 (baseline 56/2.66), BNP 1351, troponin 45 White count 14.3-->13.7 hemoglobin 10.6-->9.5   Code sepsis called concerning admission for acute superimposed on chronic systolic heart failure in addition to probable right-sided community-acquired pneumonia versus aspiration pneumonia    Hospital-Problem based course  Acute hypoxemic respiratory failure --Continue supplemental O2 to keep sats >=92%, wean as tolerated  Right-sided pneumonia IV ceftriaxone/azithromycin-->Omnicef 300 daily till 1226 SLP  clears the patient for regular diet  Acute decompensated systolic heart failure EF 45-50% on 12/21 --hold lasix due to increasing Cr  Atrial valve bioprosthetic replacement, systemic 08/2010  PVCs/SVT Not felt to be afib--predominant NSR with PVC --cont Metop  Prior GI bleed 2021, 2017 secondary to polyps Clinically stable at this time  Diabetes mellitus type 2-usually on metformin + insulin at home-A1c was 6.4 --continuing Lantus to 24 units --mealtime 2u TID  CKD 4 Rising creatinine therefore hold Lasix Repeat labs in a.m. Sees Dr. Juleen China in OP setting--OP f/u  HTN Continue amlodipine 5 twice daily Cont metop  Right upper lobe nodule 1.2 cm Requires outpatient CT in 3 to 6  months   DVT prophylaxis: lovenox Code Status: Full Family Communication:  Disposition:  Status is: Inpatient  Remains inpatient appropriate because: Cr worsening     Consultants:  Cardiology   Procedures: n  Antimicrobials:   Azithromycin and ceftriaxone from 12/21  Subjective:  Pt appeared sleepy, woke up to say he felt "real good", but then admitted to a little bit of dyspnea and little bit of cough.   Objective: Vitals:   01/21/21 1159 01/21/21 1648 01/21/21 1932 01/21/21 2352  BP: 123/75 (!) 131/50 (!) 148/92 (!) 142/87  Pulse: (!) 120 (!) 125 (!) 110 (!) 101  Resp: 18 18 18 17   Temp: (!) 97.4 F (36.3 C) 97.9 F (36.6 C) 97.9 F (36.6 C) 98 F (36.7 C)  TempSrc:    Axillary  SpO2: 96% 94% 93% 95%  Weight:      Height:        Intake/Output Summary (Last 24 hours) at 01/22/2021 0049 Last data filed at 01/21/2021 1932 Gross per 24 hour  Intake --  Output 650 ml  Net -650 ml   Filed Weights   01/20/21 0425 01/21/21 0037 01/21/21 0243  Weight: 82.6 kg 93.2 kg 93.2 kg    Examination:  Constitutional: NAD, sleepy but arousable HEENT: conjunctivae and lids normal, EOMI CV: No cyanosis.  tachycardic RESP: normal respiratory effort, on 3L Extremities: No effusions, edema in BLE SKIN: warm, dry   Data Reviewed: personally reviewed   CBC    Component Value Date/Time   WBC 13.8 (H) 01/21/2021 1106   RBC 3.47 (L) 01/21/2021 1106   HGB 9.1 (L) 01/21/2021 1106   HGB 11.6 (L) 10/10/2012 0830   HCT 29.6 (L) 01/21/2021 1106  HCT 35.7 (L) 10/10/2012 0830   PLT 155 01/21/2021 1106   PLT 108 (L) 10/10/2012 0830   MCV 85.3 01/21/2021 1106   MCV 79 (L) 10/10/2012 0830   MCH 26.2 01/21/2021 1106   MCHC 30.7 01/21/2021 1106   RDW 14.6 01/21/2021 1106   RDW 15.5 (H) 10/10/2012 0830   LYMPHSABS 0.9 01/19/2021 0636   LYMPHSABS 1.5 10/10/2012 0830   MONOABS 1.5 (H) 01/19/2021 0636   MONOABS 0.9 10/10/2012 0830   EOSABS 0.1 01/19/2021 0636   EOSABS 0.3  10/10/2012 0830   BASOSABS 0.0 01/19/2021 0636   BASOSABS 0.1 10/10/2012 0830   CMP Latest Ref Rng & Units 01/21/2021 01/20/2021 01/19/2021  Glucose 70 - 99 mg/dL 305(H) 150(H) 211(H)  BUN 8 - 23 mg/dL 57(H) 49(H) 46(H)  Creatinine 0.61 - 1.24 mg/dL 3.06(H) 3.26(H) 2.99(H)  Sodium 135 - 145 mmol/L 136 139 138  Potassium 3.5 - 5.1 mmol/L 4.3 3.5 3.4(L)  Chloride 98 - 111 mmol/L 103 107 108  CO2 22 - 32 mmol/L 25 23 23   Calcium 8.9 - 10.3 mg/dL 9.7 9.3 8.8(L)  Total Protein 6.5 - 8.1 g/dL - - 6.5  Total Bilirubin 0.3 - 1.2 mg/dL - - 0.8  Alkaline Phos 38 - 126 U/L - - 59  AST 15 - 41 U/L - - 16  ALT 0 - 44 U/L - - 12     Radiology Studies: No results found.   Scheduled Meds:  amLODipine  5 mg Oral BID   atorvastatin  40 mg Oral Daily   cefdinir  300 mg Oral Q24H   clopidogrel  75 mg Oral QHS   enoxaparin (LOVENOX) injection  30 mg Subcutaneous Q24H   ferrous sulfate  325 mg Oral TID WC   guaiFENesin  600 mg Oral BID   insulin aspart  0-9 Units Subcutaneous TID WC   insulin aspart  2 Units Subcutaneous TID WC   insulin glargine-yfgn  24 Units Subcutaneous QHS   metoprolol tartrate  100 mg Oral BID   pantoprazole  40 mg Oral Daily   vitamin B-12  1,000 mcg Oral Daily   Continuous Infusions:     LOS: 5 days    Enzo Bi, MD [facility] To contact the attending provider between 7A-7P or the covering provider during after hours 7P-7A, please log into the web site [email or web address] and access using universal [place] password for that web site. If you do not have the password, please call the hospital operator.  01/22/2021, 12:49 AM

## 2021-01-21 NOTE — Plan of Care (Signed)

## 2021-01-22 DIAGNOSIS — N179 Acute kidney failure, unspecified: Secondary | ICD-10-CM | POA: Diagnosis not present

## 2021-01-22 LAB — GLUCOSE, CAPILLARY
Glucose-Capillary: 190 mg/dL — ABNORMAL HIGH (ref 70–99)
Glucose-Capillary: 169 mg/dL — ABNORMAL HIGH (ref 70–99)
Glucose-Capillary: 153 mg/dL — ABNORMAL HIGH (ref 70–99)
Glucose-Capillary: 147 mg/dL — ABNORMAL HIGH (ref 70–99)

## 2021-01-22 LAB — BASIC METABOLIC PANEL
Anion gap: 9 (ref 5–15)
BUN: 62 mg/dL — ABNORMAL HIGH (ref 8–23)
CO2: 24 mmol/L (ref 22–32)
Calcium: 9.5 mg/dL (ref 8.9–10.3)
Chloride: 107 mmol/L (ref 98–111)
Creatinine, Ser: 3.86 mg/dL — ABNORMAL HIGH (ref 0.61–1.24)
GFR, Estimated: 14 mL/min — ABNORMAL LOW (ref >60)
Glucose, Bld: 145 mg/dL — ABNORMAL HIGH (ref 70–99)
Potassium: 3.9 mmol/L (ref 3.5–5.1)
Sodium: 140 mmol/L (ref 135–145)

## 2021-01-22 LAB — CULTURE, BLOOD (ROUTINE X 2)
Culture: NO GROWTH
Culture: NO GROWTH
Special Requests: ADEQUATE

## 2021-01-22 LAB — CBC
HCT: 25.5 % — ABNORMAL LOW (ref 39.0–52.0)
Hemoglobin: 8 g/dL — ABNORMAL LOW (ref 13.0–17.0)
MCH: 26.3 pg (ref 26.0–34.0)
MCHC: 31.4 g/dL (ref 30.0–36.0)
MCV: 83.9 fL (ref 80.0–100.0)
Platelets: 139 10*3/uL — ABNORMAL LOW (ref 150–400)
RBC: 3.04 MIL/uL — ABNORMAL LOW (ref 4.22–5.81)
RDW: 14.6 % (ref 11.5–15.5)
WBC: 10.5 10*3/uL (ref 4.0–10.5)
nRBC: 0 % (ref 0.0–0.2)

## 2021-01-22 LAB — PROTEIN / CREATININE RATIO, URINE
Creatinine, Urine: 256 mg/dL
Protein Creatinine Ratio: 0.2 mg/mg{Cre} — ABNORMAL HIGH (ref 0.00–0.15)
Total Protein, Urine: 52 mg/dL

## 2021-01-22 LAB — SODIUM, URINE, RANDOM: Sodium, Ur: 17 mmol/L

## 2021-01-22 LAB — MAGNESIUM: Magnesium: 2.3 mg/dL (ref 1.7–2.4)

## 2021-01-22 MED ORDER — DILTIAZEM HCL 30 MG PO TABS
60.0000 mg | ORAL_TABLET | Freq: Three times a day (TID) | ORAL | Status: DC
  Administered 2021-01-22 – 2021-01-24 (×7): 60 mg via ORAL
  Filled 2021-01-22 (×7): qty 2

## 2021-01-22 NOTE — Progress Notes (Signed)
[facility] Cardiology    SUBJECTIVE: Resting comfortably complains of weakness shortness of breath denies any significant chest pain has improved since initial admission no evidence of bleeding still on oxygen and antibiotic therapy   Vitals:   01/22/21 0418 01/22/21 0500 01/22/21 0831 01/22/21 1148  BP: 136/78  130/67 111/71  Pulse: 81  (!) 106 97  Resp: 16  18 18   Temp: 97.9 F (36.6 C)  97.9 F (36.6 C) 98.2 F (36.8 C)  TempSrc: Axillary  Oral Oral  SpO2: 94%  95% 95%  Weight:  93.2 kg    Height:         Intake/Output Summary (Last 24 hours) at 01/22/2021 1324 Last data filed at 01/22/2021 0700 Gross per 24 hour  Intake --  Output 650 ml  Net -650 ml      PHYSICAL EXAM  General: Well developed, well nourished, in no acute distress HEENT:  Normocephalic and atramatic Neck:  No JVD.  Lungs: Clear bilaterally to auscultation and percussion. Heart: HRRR . Normal S1 and S2 without gallops or murmurs.  Abdomen: Bowel sounds are positive, abdomen soft and non-tender  Msk:  Back normal, normal gait. Normal strength and tone for age. Extremities: No clubbing, cyanosis or edema.   Neuro: Alert and oriented X 3. Psych:  Good affect, responds appropriately   LABS: Basic Metabolic Panel: Recent Labs    01/21/21 1106 01/22/21 0546  NA 136 140  K 4.3 3.9  CL 103 107  CO2 25 24  GLUCOSE 305* 145*  BUN 57* 62*  CREATININE 3.06* 3.86*  CALCIUM 9.7 9.5  MG 2.4 2.3   Liver Function Tests: No results for input(s): AST, ALT, ALKPHOS, BILITOT, PROT, ALBUMIN in the last 72 hours. No results for input(s): LIPASE, AMYLASE in the last 72 hours. CBC: Recent Labs    01/21/21 1106 01/22/21 0546  WBC 13.8* 10.5  HGB 9.1* 8.0*  HCT 29.6* 25.5*  MCV 85.3 83.9  PLT 155 139*   Cardiac Enzymes: No results for input(s): CKTOTAL, CKMB, CKMBINDEX, TROPONINI in the last 72 hours. BNP: Invalid input(s): POCBNP D-Dimer: No results for input(s): DDIMER in the last 72  hours. Hemoglobin A1C: No results for input(s): HGBA1C in the last 72 hours. Fasting Lipid Panel: No results for input(s): CHOL, HDL, LDLCALC, TRIG, CHOLHDL, LDLDIRECT in the last 72 hours. Thyroid Function Tests: No results for input(s): TSH, T4TOTAL, T3FREE, THYROIDAB in the last 72 hours.  Invalid input(s): FREET3 Anemia Panel: No results for input(s): VITAMINB12, FOLATE, FERRITIN, TIBC, IRON, RETICCTPCT in the last 72 hours.  No results found.   Echo mild reduced left ventricular function ejection fraction of 45 to 50% well-seated bioprosthetic aortic valve  TELEMETRY: Tachycardia: Sinus with PACs rate of about 100  ASSESSMENT AND PLAN:  Principal Problem:   Acute CHF (congestive heart failure) (HCC) Congestive heart failure systolic function History of CVA Hyperlipidemia Aortic valve replacement bioprosthetic History of carotid endarterectomy Coronary bypass surgery 2012 at [facility] GI bleed recently Chronic renal insufficiency Elevated BNP just above heart failure  Plan Continue telemetry Agree with heart failure management with Lasix Continue statin therapy for hyperlipidemia Maintain adequate hydration and perfusion of his kidneys with renal insufficiency stage IV CVA status post carotid enterectomy continue current management Coronary bypass surgery stable continue current medical therapy Relative hypoxemia recommend supplemental oxygen as necessary Bioprosthetic aortic valve replacement 2012 continue current therapy appears to have preserved function Continue antibiotic therapy for possible sepsis Recommend physical and occupational therapy  Yolonda Kida, MD 01/22/2021 1:24 PM

## 2021-01-22 NOTE — Progress Notes (Signed)
PROGRESS NOTE   Ryan Mcclure  JTT:[id number] DOB: 30-Aug-1931 DOA: 01/28/2021 PCP: Maryland Pink, MD  Brief Narrative:   87 year old white male [place] [facility] resident HFrEF, CKD 4, DM TY 2, reflux, HLD, CVA with carotid endarterectomy, AoV replacement bioprosthetic valve CABG and stent August 2012 followed at [facility] Admission for GI bleed 12/25/2019-5 mm polyp present at that time-prior GI bleed 2017   Presented 12/22 [facility] ED low back pain in the setting of mild cough X 3 weeks-found to have oxygen 87% on room air and started on oxygen, T-max 100.5 mildly hypotensive CXR 12/20 mild vascular congestion CT chest atelectasis lung bases no focal pneumonia no effusion Sodium 138 BUN/creatinine 45/2.6 (baseline 56/2.66), BNP 1351, troponin 45 White count 14.3-->13.7 hemoglobin 10.6-->9.5   Code sepsis called concerning admission for acute superimposed on chronic systolic heart failure in addition to probable right-sided community-acquired pneumonia versus aspiration pneumonia    Hospital-Problem based course  Acute hypoxemic respiratory failure --not on home O2 --Continue supplemental O2 to keep sats >=92%, wean as tolerated  Right-sided pneumonia IV ceftriaxone/azithromycin-->Omnicef 300 daily till 1226 --cont Omnicef  Acute decompensated systolic heart failure EF 45-50% on 12/21 --hold Lasix due to increasing Cr  Atrial valve bioprosthetic replacement, systemic 08/2010  PVCs/SVT --Not felt to be afib--predominant NSR with PVC --cont metop  Prior GI bleed 2021, 2017 secondary to polyps --not currently active  Diabetes mellitus type 2 -usually on metformin + insulin at home -A1c was 6.4 --continuing Lantus to 24 units --mealtime 2u TID  CKD 4 --Rising creatinine therefore hold Lasix --Sees Dr. Juleen China in OP setting --nephrology consult  HTN Continue amlodipine 5 twice daily Cont metop  Right upper lobe nodule 1.2 cm Requires outpatient CT in 3 to 6 months   DVT  prophylaxis: Lovenox SQ Code Status: Full code  Family Communication:  Status is: inpatient Dispo:   The patient is from: ILF Anticipated d/c is to: SNF Anticipated d/c date is: undetermined Patient currently is not medically stable to d/c due to: worsening Cr     Consultants:  Cardiology   Procedures: n  Antimicrobials:   Azithromycin and ceftriaxone from 12/21  Subjective: Pt continued to be very sleepy but easily awoke.  Reported doing ok, but when asked about dyspnea, pt said yes.   Objective: Vitals:   01/22/21 0831 01/22/21 1148 01/22/21 1534 01/22/21 2023  BP: 130/67 111/71 108/73 (!) 158/55  Pulse: (!) 106 97 71 86  Resp: 18 18 18 20   Temp: 97.9 F (36.6 C) 98.2 F (36.8 C) 98.1 F (36.7 C) 99 F (37.2 C)  TempSrc: Oral Oral Oral   SpO2: 95% 95% (!) 87% 94%  Weight:      Height:        Intake/Output Summary (Last 24 hours) at 01/22/2021 2103 Last data filed at 01/22/2021 0956 Gross per 24 hour  Intake 240 ml  Output 400 ml  Net -160 ml   Filed Weights   01/21/21 0037 01/21/21 0243 01/22/21 0500  Weight: 93.2 kg 93.2 kg 93.2 kg    Examination:  Constitutional: NAD, lethargic but arousable HEENT: conjunctivae and lids normal, EOMI CV: No cyanosis.   RESP: normal respiratory effort, on 2L Extremities: No effusions, edema in BLE SKIN: warm, dry   Data Reviewed: personally reviewed   CBC    Component Value Date/Time   WBC 10.5 01/22/2021 0546   RBC 3.04 (L) 01/22/2021 0546   HGB 8.0 (L) 01/22/2021 0546   HGB 11.6 (L) 10/10/2012 0830  HCT 25.5 (L) 01/22/2021 0546   HCT 35.7 (L) 10/10/2012 0830   PLT 139 (L) 01/22/2021 0546   PLT 108 (L) 10/10/2012 0830   MCV 83.9 01/22/2021 0546   MCV 79 (L) 10/10/2012 0830   MCH 26.3 01/22/2021 0546   MCHC 31.4 01/22/2021 0546   RDW 14.6 01/22/2021 0546   RDW 15.5 (H) 10/10/2012 0830   LYMPHSABS 0.9 01/19/2021 0636   LYMPHSABS 1.5 10/10/2012 0830   MONOABS 1.5 (H) 01/19/2021 0636   MONOABS 0.9  10/10/2012 0830   EOSABS 0.1 01/19/2021 0636   EOSABS 0.3 10/10/2012 0830   BASOSABS 0.0 01/19/2021 0636   BASOSABS 0.1 10/10/2012 0830   CMP Latest Ref Rng & Units 01/22/2021 01/21/2021 01/20/2021  Glucose 70 - 99 mg/dL 145(H) 305(H) 150(H)  BUN 8 - 23 mg/dL 62(H) 57(H) 49(H)  Creatinine 0.61 - 1.24 mg/dL 3.86(H) 3.06(H) 3.26(H)  Sodium 135 - 145 mmol/L 140 136 139  Potassium 3.5 - 5.1 mmol/L 3.9 4.3 3.5  Chloride 98 - 111 mmol/L 107 103 107  CO2 22 - 32 mmol/L 24 25 23   Calcium 8.9 - 10.3 mg/dL 9.5 9.7 9.3  Total Protein 6.5 - 8.1 g/dL - - -  Total Bilirubin 0.3 - 1.2 mg/dL - - -  Alkaline Phos 38 - 126 U/L - - -  AST 15 - 41 U/L - - -  ALT 0 - 44 U/L - - -     Radiology Studies: No results found.   Scheduled Meds:  atorvastatin  40 mg Oral Daily   cefdinir  300 mg Oral Q24H   clopidogrel  75 mg Oral QHS   diltiazem  60 mg Oral Q8H   enoxaparin (LOVENOX) injection  30 mg Subcutaneous Q24H   ferrous sulfate  325 mg Oral TID WC   guaiFENesin  600 mg Oral BID   insulin aspart  0-9 Units Subcutaneous TID WC   insulin aspart  2 Units Subcutaneous TID WC   insulin glargine-yfgn  24 Units Subcutaneous QHS   metoprolol tartrate  100 mg Oral BID   pantoprazole  40 mg Oral Daily   vitamin B-12  1,000 mcg Oral Daily   Continuous Infusions:     LOS: 5 days    Enzo Bi, MD [facility] To contact the attending provider between 7A-7P or the covering provider during after hours 7P-7A, please log into the web site [email or web address] and access using universal [place] password for that web site. If you do not have the password, please call the hospital operator.  01/22/2021, 9:03 PM

## 2021-01-23 ENCOUNTER — Inpatient Hospital Stay: Payer: Medicare Other

## 2021-01-23 ENCOUNTER — Other Ambulatory Visit: Payer: Self-pay

## 2021-01-23 DIAGNOSIS — I4891 Unspecified atrial fibrillation: Secondary | ICD-10-CM

## 2021-01-23 DIAGNOSIS — I50811 Acute right heart failure: Secondary | ICD-10-CM | POA: Diagnosis not present

## 2021-01-23 DIAGNOSIS — R079 Chest pain, unspecified: Secondary | ICD-10-CM

## 2021-01-23 LAB — BASIC METABOLIC PANEL
Anion gap: 8 (ref 5–15)
BUN: 74 mg/dL — ABNORMAL HIGH (ref 8–23)
CO2: 24 mmol/L (ref 22–32)
Calcium: 9.6 mg/dL (ref 8.9–10.3)
Chloride: 108 mmol/L (ref 98–111)
Creatinine, Ser: 4.53 mg/dL — ABNORMAL HIGH (ref 0.61–1.24)
GFR, Estimated: 12 mL/min — ABNORMAL LOW (ref >60)
Glucose, Bld: 159 mg/dL — ABNORMAL HIGH (ref 70–99)
Potassium: 4.3 mmol/L (ref 3.5–5.1)
Sodium: 140 mmol/L (ref 135–145)

## 2021-01-23 LAB — BLOOD GAS, ARTERIAL
Acid-base deficit: 1.6 mmol/L (ref 0.0–2.0)
Bicarbonate: 25.4 mmol/L (ref 20.0–28.0)
FIO2: 0.36
O2 Saturation: 91 %
Patient temperature: 37
pCO2 arterial: 54 mmHg — ABNORMAL HIGH (ref 32.0–48.0)
pH, Arterial: 7.28 — ABNORMAL LOW (ref 7.350–7.450)
pO2, Arterial: 69 mmHg — ABNORMAL LOW (ref 83.0–108.0)

## 2021-01-23 LAB — CULTURE, BLOOD (ROUTINE X 2)
Culture: NO GROWTH
Culture: NO GROWTH
Special Requests: ADEQUATE

## 2021-01-23 LAB — GLUCOSE, CAPILLARY
Glucose-Capillary: 189 mg/dL — ABNORMAL HIGH (ref 70–99)
Glucose-Capillary: 146 mg/dL — ABNORMAL HIGH (ref 70–99)
Glucose-Capillary: 170 mg/dL — ABNORMAL HIGH (ref 70–99)
Glucose-Capillary: 138 mg/dL — ABNORMAL HIGH (ref 70–99)
Glucose-Capillary: 173 mg/dL — ABNORMAL HIGH (ref 70–99)

## 2021-01-23 LAB — CBC
HCT: 29.1 % — ABNORMAL LOW (ref 39.0–52.0)
Hemoglobin: 8.9 g/dL — ABNORMAL LOW (ref 13.0–17.0)
MCH: 26.1 pg (ref 26.0–34.0)
MCHC: 30.6 g/dL (ref 30.0–36.0)
MCV: 85.3 fL (ref 80.0–100.0)
Platelets: 162 10*3/uL (ref 150–400)
RBC: 3.41 MIL/uL — ABNORMAL LOW (ref 4.22–5.81)
RDW: 14.6 % (ref 11.5–15.5)
WBC: 12.1 10*3/uL — ABNORMAL HIGH (ref 4.0–10.5)
nRBC: 0 % (ref 0.0–0.2)

## 2021-01-23 LAB — UREA NITROGEN, URINE: Urea Nitrogen, Ur: 555 mg/dL

## 2021-01-23 LAB — HEPATITIS B CORE ANTIBODY, TOTAL: Hep B Core Total Ab: NONREACTIVE

## 2021-01-23 LAB — TROPONIN I (HIGH SENSITIVITY)
Troponin I (High Sensitivity): 262 ng/L (ref <18)
Troponin I (High Sensitivity): 237 ng/L (ref <18)

## 2021-01-23 LAB — MAGNESIUM: Magnesium: 2.5 mg/dL — ABNORMAL HIGH (ref 1.7–2.4)

## 2021-01-23 MED ORDER — METOPROLOL TARTRATE 25 MG PO TABS
25.0000 mg | ORAL_TABLET | Freq: Two times a day (BID) | ORAL | Status: DC
  Administered 2021-01-23 – 2021-01-26 (×6): 25 mg via ORAL
  Filled 2021-01-23 (×6): qty 1

## 2021-01-23 MED ORDER — HEPARIN SODIUM (PORCINE) 5000 UNIT/ML IJ SOLN
5000.0000 [IU] | Freq: Three times a day (TID) | INTRAMUSCULAR | Status: DC
  Administered 2021-01-24 – 2021-01-26 (×8): 5000 [IU] via SUBCUTANEOUS
  Filled 2021-01-23 (×8): qty 1

## 2021-01-23 NOTE — Progress Notes (Signed)
Physical Therapy Treatment Patient Details Name: Ryan Mcclure MRN: [id number] DOB: 17-Jul-1931 Today's Date: 01/23/2021   History of Present Illness Patient is a 87 year old male with medical history significant for systolic CHF, stage III chronic kidney disease, type 2 diabetes mellitus, CVA, GERD and dyslipidemia, presented to the ER with acute onset of low back pain for the last couple of days with radiculopathy. Acute on chronic systolic CHF, Right-sided community-acquired pneumonia, Acute hypoxic respiratory failure. Imaging with chronically stable L1-L2 compression fractures    PT Comments    Patient is able to participate with PT today, however is more lethargic overall than previous session. He required maximal assistance for bed mobility with fair sitting balance. Difficulty sequencing and poor initiation with transfer efforts. Further out of bed mobility deferred. Sp02 initially 86% with sitting upright but increased to 90% with cues for breathing techniques. Slow progress overall with functional independence. Recommend to continue PT to maximize independence and facilitate return to prior level of function. SNF recommended at discharge.     Recommendations for follow up therapy are one component of a multi-disciplinary discharge planning process, led by the attending physician.  Recommendations may be updated based on patient status, additional functional criteria and insurance authorization.  Follow Up Recommendations  Skilled nursing-short term rehab (<3 hours/day)     Assistance Recommended at Discharge Frequent or constant Supervision/Assistance  Equipment Recommendations  None recommended by PT    Recommendations for Other Services       Precautions / Restrictions Precautions Precautions: Fall Restrictions Weight Bearing Restrictions: No     Mobility  Bed Mobility Overal bed mobility: Needs Assistance Bed Mobility: Supine to Sit;Sit to Supine     Supine to sit:  Max assist Sit to supine: Max assist   General bed mobility comments: assistance for trunk and BLE support. increased time for motor planning and sequencing. increased effort required during mobility    Transfers Overall transfer level: Needs assistance                 General transfer comment: maximal assistance for one lateral scooting bout along edge of bed. minimal participation, difficulty initiating movements. unable to progress to standing at this time. Sp02 initially 86% and increased to 90% with cues for breathing techniques.    Ambulation/Gait                   Stairs             Wheelchair Mobility    Modified Rankin (Stroke Patients Only)       Balance   Sitting-balance support: Feet supported Sitting balance-Leahy Scale: Fair Sitting balance - Comments: no loss of balance in sitting. close stand by assistance for safety                                    Cognition Arousal/Alertness: Lethargic Behavior During Therapy: [facility] for tasks assessed/performed Overall Cognitive Status: No family/caregiver present to determine baseline cognitive functioning                                 General Comments: patient needs increased time for following commands. he is leathgic during session and has eyes closed for parts of session.        Exercises General Exercises - Lower Extremity Ankle Circles/Pumps: AAROM;Strengthening;Both;10 reps;Supine Hip ABduction/ADduction: AAROM;Strengthening;Both;10  reps;Supine Other Exercises Other Exercises: verbal and tactile cues for participation    General Comments General comments (skin integrity, edema, etc.): after return to bed, Sp02 92%, heart rate 63bpm, and blood pressure 126/53 mmHg      Pertinent Vitals/Pain Pain Assessment: No/denies pain    Home Living                          Prior Function            PT Goals (current goals can now be found in the  care plan section) Acute Rehab PT Goals Patient Stated Goal: none stated PT Goal Formulation: With patient Time For Goal Achievement: 02/02/21 Potential to Achieve Goals: Good Progress towards PT goals: Progressing toward goals    Frequency    Min 2X/week      PT Plan Current plan remains appropriate    Co-evaluation              AM-PAC PT "6 Clicks" Mobility   Outcome Measure  Help needed turning from your back to your side while in a flat bed without using bedrails?: A Little Help needed moving from lying on your back to sitting on the side of a flat bed without using bedrails?: A Lot Help needed moving to and from a bed to a chair (including a wheelchair)?: A Lot Help needed standing up from a chair using your arms (e.g., wheelchair or bedside chair)?: A Lot Help needed to walk in hospital room?: A Lot Help needed climbing 3-5 steps with a railing? : A Lot 6 Click Score: 13    End of Session   Activity Tolerance: Patient limited by lethargy Patient left: in bed;with call bell/phone within reach;with bed alarm set;with nursing/sitter in room (nurse in the room) Nurse Communication: Mobility status (patient lethargy) PT Visit Diagnosis: Muscle weakness (generalized) (M62.81);Unsteadiness on feet (R26.81)     Time: 9753-0051 PT Time Calculation (min) (ACUTE ONLY): 25 min  Charges:  $Therapeutic Exercise: 8-22 mins $Therapeutic Activity: 8-22 mins                     Minna Merritts, PT, MPT    Percell Locus 01/23/2021, 2:52 PM

## 2021-01-23 NOTE — Progress Notes (Signed)
PROGRESS NOTE   Ryan Mcclure  HUT:[id number] DOB: Aug 25, 1931 DOA: 01/26/2021 PCP: [place] Pink, MD  Brief Narrative:   87 year old white male [place] [facility] resident HFrEF, CKD 4, DM TY 2, reflux, HLD, CVA with carotid endarterectomy, AoV replacement bioprosthetic valve CABG and stent August 2012 followed at [facility] Admission for GI bleed 12/25/2019-5 mm polyp present at that time-prior GI bleed 2017   Presented 12/22 [facility] ED low back pain in the setting of mild cough X 3 weeks-found to have oxygen 87% on room air and started on oxygen, T-max 100.5 mildly hypotensive CXR 12/20 mild vascular congestion CT chest atelectasis lung bases no focal pneumonia no effusion Sodium 138 BUN/creatinine 45/2.6 (baseline 56/2.66), BNP 1351, troponin 45 White count 14.3-->13.7 hemoglobin 10.6-->9.5   Code sepsis called concerning admission for acute superimposed on chronic systolic heart failure in addition to probable right-sided community-acquired pneumonia versus aspiration pneumonia    Hospital-Problem based course  Troponin elevation likely 2/2 demand ischemia --complained of chest pain today, Trop 262 but then trended down.  --cardiology already on board.  Acute hypoxemic respiratory failure --not on home O2, currently on 3L --Continue supplemental O2 to keep sats >=92%, wean as tolerated  Acute hypercapnic respiratory failure --may be contributing to pt's lethargy. --ABG today, pH 7.28, pCO2 54 --start BiPAP  Right-sided pneumonia Completed a course with IV ceftriaxone and azithromycin-->Omnicef 300   Acute decompensated systolic heart failure EF 45-50% on 12/21 --hold lasix 2/2 increasing Cr  Atrial valve bioprosthetic replacement, systemic 08/2010  PVCs/SVT --Not felt to be afib--predominant NSR with PVC --cont metop  Prior GI bleed 2021, 2017 secondary to polyps --not currently active  Diabetes mellitus type 2 -usually on metformin + insulin at home -A1c was  6.4 --continuing Lantus to 24 units --mealtime 2u TID  AKI on CKD 4 --Rising creatinine therefore hold Lasix --Sees Dr. Juleen China in OP setting --nephro consulted --continue to hold diuretics --hold telmisartan  HTN Continue amlodipine 5 twice daily Cont metop  Right upper lobe nodule 1.2 cm Requires outpatient CT in 3 to 6 months   DVT prophylaxis: Heparin SQ Code Status: Full code  Family Communication:  Status is: inpatient Dispo:   The patient is from: ILF Anticipated d/c is to: SNF Anticipated d/c date is: undetermined Patient currently is not medically stable to d/c due to: worsening Cr    Consultants:  Cardiology   Procedures: n  Antimicrobials:   Azithromycin and ceftriaxone from 12/21  Subjective: Pt continued to be somnolent today.  Kidney function worsened.  ABG showed elevated pCO2.  BiPAP ordered.  Later in the day, pt complained of chest pain.  Trop 262 but then trended down.   Objective: Vitals:   01/23/21 1618 01/23/21 1625 01/23/21 1834 01/23/21 1955  BP: 109/78  128/70   Pulse: (!) 105  83   Resp:  16 17   Temp:   97.8 F (36.6 C)   TempSrc:   Oral   SpO2:   93% 95%  Weight:      Height:        Intake/Output Summary (Last 24 hours) at 01/23/2021 2057 Last data filed at 01/23/2021 0400 Gross per 24 hour  Intake --  Output 200 ml  Net -200 ml   Filed Weights   01/21/21 0243 01/22/21 0500 01/23/21 0500  Weight: 93.2 kg 93.2 kg 93.4 kg    Examination:  Constitutional: NAD, somnolent, but arousable and oriented HEENT: conjunctivae and lids normal, EOMI CV: No cyanosis.  RESP: normal respiratory effort, on 3L SKIN: warm, dry   Data Reviewed: personally reviewed   CBC    Component Value Date/Time   WBC 12.1 (H) 01/23/2021 0603   RBC 3.41 (L) 01/23/2021 0603   HGB 8.9 (L) 01/23/2021 0603   HGB 11.6 (L) 10/10/2012 0830   HCT 29.1 (L) 01/23/2021 0603   HCT 35.7 (L) 10/10/2012 0830   PLT 162 01/23/2021 0603   PLT 108 (L)  10/10/2012 0830   MCV 85.3 01/23/2021 0603   MCV 79 (L) 10/10/2012 0830   MCH 26.1 01/23/2021 0603   MCHC 30.6 01/23/2021 0603   RDW 14.6 01/23/2021 0603   RDW 15.5 (H) 10/10/2012 0830   LYMPHSABS 0.9 01/19/2021 0636   LYMPHSABS 1.5 10/10/2012 0830   MONOABS 1.5 (H) 01/19/2021 0636   MONOABS 0.9 10/10/2012 0830   EOSABS 0.1 01/19/2021 0636   EOSABS 0.3 10/10/2012 0830   BASOSABS 0.0 01/19/2021 0636   BASOSABS 0.1 10/10/2012 0830   CMP Latest Ref Rng & Units 01/23/2021 01/22/2021 01/21/2021  Glucose 70 - 99 mg/dL 159(H) 145(H) 305(H)  BUN 8 - 23 mg/dL 74(H) 62(H) 57(H)  Creatinine 0.61 - 1.24 mg/dL 4.53(H) 3.86(H) 3.06(H)  Sodium 135 - 145 mmol/L 140 140 136  Potassium 3.5 - 5.1 mmol/L 4.3 3.9 4.3  Chloride 98 - 111 mmol/L 108 107 103  CO2 22 - 32 mmol/L 24 24 25   Calcium 8.9 - 10.3 mg/dL 9.6 9.5 9.7  Total Protein 6.5 - 8.1 g/dL - - -  Total Bilirubin 0.3 - 1.2 mg/dL - - -  Alkaline Phos 38 - 126 U/L - - -  AST 15 - 41 U/L - - -  ALT 0 - 44 U/L - - -     Radiology Studies: US RENAL  Result Date: 01/23/2021 CLINICAL DATA:  Acute kidney failure EXAM: RENAL / URINARY TRACT ULTRASOUND COMPLETE COMPARISON:  None. FINDINGS: Right Kidney: Renal measurements: 10.2 x 5.2 x 6.2 cm = volume: 172 mL. Echogenicity within normal limits. No mass or hydronephrosis visualized. Left Kidney: Renal measurements: 11.7 x 6.1 x 5.5 cm = volume: 204 mL. Echogenicity within normal limits. No mass or hydronephrosis visualized. Bladder: Appears normal for degree of bladder distention. Other: None. IMPRESSION: No acute findings.  No hydronephrosis. Electronically Signed   By: Rolm Baptise M.D.   On: 01/23/2021 12:43     Scheduled Meds:  atorvastatin  40 mg Oral Daily   cefdinir  300 mg Oral Q24H   clopidogrel  75 mg Oral QHS   diltiazem  60 mg Oral Q8H   ferrous sulfate  325 mg Oral TID WC   guaiFENesin  600 mg Oral BID   [START ON 01/24/2021] heparin injection (subcutaneous)  5,000 Units  Subcutaneous Q8H   insulin aspart  0-9 Units Subcutaneous TID WC   insulin aspart  2 Units Subcutaneous TID WC   insulin glargine-yfgn  24 Units Subcutaneous QHS   metoprolol tartrate  25 mg Oral BID   pantoprazole  40 mg Oral Daily   vitamin B-12  1,000 mcg Oral Daily   Continuous Infusions:     LOS: 6 days    Enzo Bi, MD [facility] To contact the attending provider between 7A-7P or the covering provider during after hours 7P-7A, please log into the web site [email or web address] and access using universal [place] password for that web site. If you do not have the password, please call the hospital operator.  01/23/2021, 8:57 PM

## 2021-01-23 NOTE — Progress Notes (Signed)
Date and time results received: 01/23/21 1851 (use smartphrase ".now" to insert current time)  Test: troponin Critical Value: 262  Name of Provider Notified: Heloise Ochoa  Orders Received? Or Actions Taken?:  serial troponins

## 2021-01-23 NOTE — Progress Notes (Signed)
[facility]  ROUNDING NOTE   Subjective:   [name] was admitted to [facility] on 01/21/2021 for Acute CHF (congestive heart failure) ([place]) [I50.9] Acute midline low back pain without sciatica [M54.50] Community acquired pneumonia, unspecified laterality [J18.9] Acute congestive heart failure, unspecified heart failure type ([place]) [I50.9] Sepsis, due to unspecified organism, unspecified whether acute organ dysfunction present [facility]) [A41.9]  Hospital course includes starting IV furosemide. Furosemide discontinued on 12/23. Creatinine on admission of 2.62 and now creatinine of 4.53. Nephrology consulted. No recent IV contrast exposure.   Patient states he is doing well and has no complaints. Denies any shortness of breath or chest pain.   Patient last seen by nephrology in 03/2018. Seems to have been lost to follow up with COVID-19 pandemic.   Objective:  Vital signs in last 24 hours:  Temp:  [97.4 F (36.3 C)-99 F (37.2 C)] 97.4 F (36.3 C) (12/26 0805) Pulse Rate:  [57-97] 59 (12/26 0805) Resp:  [18-20] 18 (12/26 0805) BP: (108-158)/(50-73) 143/50 (12/26 0805) SpO2:  [87 %-96 %] 88 % (12/26 0805) Weight:  [93.4 kg] 93.4 kg (12/26 0500)  Weight change: 0.2 kg Filed Weights   01/21/21 0243 01/22/21 0500 01/23/21 0500  Weight: 93.2 kg 93.2 kg 93.4 kg    Intake/Output: I/O last 3 completed shifts: In: 240 [P.O.:240] Out: 600 [Urine:600]   Intake/Output this shift:  No intake/output data recorded.  Physical Exam: General: NAD, laying in bed  Head: Normocephalic, atraumatic. Moist oral mucosal membranes  Eyes: Anicteric, PERRL  Neck: Supple, trachea midline  Lungs:  Clear to auscultation, 3L [place] O2  Heart: Regular rate and rhythm  Abdomen:  Soft, nontender,   Extremities:  no peripheral edema.  Neurologic: Nonfocal, moving all four extremities  Skin: No lesions  Access: none    Basic Metabolic Panel: Recent Labs  Lab 01/19/21 0636 01/20/21 0454  01/20/21 0855 01/21/21 1106 01/22/21 0546 01/23/21 0603  NA 138  --  139 136 140 140  K 3.4*  --  3.5 4.3 3.9 4.3  CL 108  --  107 103 107 108  CO2 23  --  23 25 24 24   GLUCOSE 211*  --  150* 305* 145* 159*  BUN 46*  --  49* 57* 62* 74*  CREATININE 2.99*  --  3.26* 3.06* 3.86* 4.53*  CALCIUM 8.8*  --  9.3 9.7 9.5 9.6  MG 1.6* 1.7  --  2.4 2.3 2.5*    Liver Function Tests: Recent Labs  Lab 01/26/2021 2051 01/19/21 0636  AST 17 16  ALT 12 12  ALKPHOS 77 59  BILITOT 0.9 0.8  PROT 8.2* 6.5  ALBUMIN 3.5 2.7*   No results for input(s): LIPASE, AMYLASE in the last 168 hours. No results for input(s): AMMONIA in the last 168 hours.  CBC: Recent Labs  Lab 01/18/2021 2051 01/18/21 0324 01/19/21 0636 01/21/21 1106 01/22/21 0546 01/23/21 0603  WBC 14.3* 13.7* 13.2* 13.8* 10.5 12.1*  NEUTROABS 11.1*  --  10.6*  --   --   --   HGB 10.6* 9.5* 8.2* 9.1* 8.0* 8.9*  HCT 33.6* 30.3* 25.7* 29.6* 25.5* 29.1*  MCV 83.8 83.9 83.4 85.3 83.9 85.3  PLT 139* 125* 119* 155 139* 162    Cardiac Enzymes: No results for input(s): CKTOTAL, CKMB, CKMBINDEX, TROPONINI in the last 168 hours.  BNP: Invalid input(s): POCBNP  CBG: Recent Labs  Lab 01/22/21 1142 01/22/21 1147 01/22/21 1702 01/22/21 2026 01/23/21 0804  GLUCAP 189* 169*  153* 147* 146*    Microbiology: Results for orders placed or performed during the hospital encounter of 01/16/2021  Resp Panel by RT-PCR (Flu A&B, Covid) Nasopharyngeal Swab     Status: None   Collection Time: 01/02/2021  8:55 PM   Specimen: Nasopharyngeal Swab; Nasopharyngeal(NP) swabs in vial transport medium  Result Value Ref Range Status   SARS Coronavirus 2 by RT PCR NEGATIVE NEGATIVE Final    Comment: (NOTE) SARS-CoV-2 target nucleic acids are NOT DETECTED.  The SARS-CoV-2 RNA is generally detectable in upper respiratory specimens during the acute phase of infection. The lowest concentration of SARS-CoV-2 viral copies this assay can detect is 138  copies/mL. A negative result does not preclude SARS-Cov-2 infection and should not be used as the sole basis for treatment or other patient management decisions. A negative result may occur with  improper specimen collection/handling, submission of specimen other than nasopharyngeal swab, presence of viral mutation(s) within the areas targeted by this assay, and inadequate number of viral copies(<138 copies/mL). A negative result must be combined with clinical observations, patient history, and epidemiological information. The expected result is Negative.  Fact Sheet for Patients:  EntrepreneurPulse.com.au  Fact Sheet for Healthcare Providers:  IncredibleEmployment.be  This test is no t yet approved or cleared by the [place] FDA and  has been authorized for detection and/or diagnosis of SARS-CoV-2 by FDA under an Emergency Use Authorization (EUA). This EUA will remain  in effect (meaning this test can be used) for the duration of the COVID-19 declaration under Section 564(b)(1) of the Act, 21 U.S.C.section 360bbb-3(b)(1), unless the authorization is terminated  or revoked sooner.       Influenza A by PCR NEGATIVE NEGATIVE Final   Influenza B by PCR NEGATIVE NEGATIVE Final    Comment: (NOTE) The Xpert Xpress SARS-CoV-2/FLU/RSV plus assay is intended as an aid in the diagnosis of influenza from Nasopharyngeal swab specimens and should not be used as a sole basis for treatment. Nasal washings and aspirates are unacceptable for Xpert Xpress SARS-CoV-2/FLU/RSV testing.  Fact Sheet for Patients: EntrepreneurPulse.com.au  Fact Sheet for Healthcare Providers: IncredibleEmployment.be  This test is not yet approved or cleared by the [place] FDA and has been authorized for detection and/or diagnosis of SARS-CoV-2 by FDA under an Emergency Use Authorization (EUA). This EUA will remain in effect (meaning  this test can be used) for the duration of the COVID-19 declaration under Section 564(b)(1) of the Act, 21 U.S.C. section 360bbb-3(b)(1), unless the authorization is terminated or revoked.  Performed at [facility], [address] [place], [place] [address]   Urine Culture     Status: Abnormal   Collection Time: 12/29/2020  9:13 PM   Specimen: In/Out Cath Urine  Result Value Ref Range Status   Specimen Description   Final    IN/OUT CATH URINE Performed at [facility], [address] [place], [place] [address]    Special Requests   Final    NONE Performed at [facility], [address] [place], [place] [address]    Culture MULTIPLE SPECIES PRESENT, SUGGEST RECOLLECTION (A)  Final   Report Status 01/19/2021 FINAL  Final  Blood Culture (routine x 2)     Status: None   Collection Time: 01/19/2021  9:26 PM   Specimen: BLOOD  Result Value Ref Range Status   Specimen Description BLOOD LEFT ANTECUBITAL  Final   Special Requests   Final    BOTTLES DRAWN AEROBIC AND ANAEROBIC Blood Culture results  may not be optimal due to an excessive volume of blood received in culture bottles   Culture   Final    NO GROWTH 5 DAYS Performed at [facility], [address] [place], [place] [address]    Report Status 01/22/2021 FINAL  Final  Blood Culture (routine x 2)     Status: None   Collection Time: 12/30/2020  9:26 PM   Specimen: BLOOD  Result Value Ref Range Status   Specimen Description BLOOD BLOOD RIGHT HAND  Final   Special Requests   Final    BOTTLES DRAWN AEROBIC AND ANAEROBIC Blood Culture adequate volume   Culture   Final    NO GROWTH 5 DAYS Performed at [facility], [address] [place], [place] [address]    Report Status 01/22/2021 FINAL  Final  Culture, blood (x 2)     Status: None   Collection Time: 01/18/21  3:24 AM   Specimen: BLOOD  Result Value Ref Range Status   Specimen Description BLOOD LEFT HAND  Final   Special  Requests   Final    BOTTLES DRAWN AEROBIC AND ANAEROBIC Blood Culture adequate volume   Culture   Final    NO GROWTH 5 DAYS Performed at [facility], [address], [place], [place] [address]    Report Status 01/23/2021 FINAL  Final  Culture, blood (x 2)     Status: None   Collection Time: 01/18/21  3:24 AM   Specimen: BLOOD  Result Value Ref Range Status   Specimen Description BLOOD RIGHT HAND  Final   Special Requests   Final    BOTTLES DRAWN AEROBIC AND ANAEROBIC Blood Culture results may not be optimal due to an inadequate volume of blood received in culture bottles   Culture   Final    NO GROWTH 5 DAYS Performed at [facility], [address], [place], [place] [address]    Report Status 01/23/2021 FINAL  Final  SARS CORONAVIRUS 2 (TAT 6-24 HRS) Nasopharyngeal Nasopharyngeal Swab     Status: None   Collection Time: 01/20/21  8:00 PM   Specimen: Nasopharyngeal Swab  Result Value Ref Range Status   SARS Coronavirus 2 NEGATIVE NEGATIVE Final    Comment: (NOTE) SARS-CoV-2 target nucleic acids are NOT DETECTED.  The SARS-CoV-2 RNA is generally detectable in upper and lower respiratory specimens during the acute phase of infection. Negative results do not preclude SARS-CoV-2 infection, do not rule out co-infections with other pathogens, and should not be used as the sole basis for treatment or other patient management decisions. Negative results must be combined with clinical observations, patient history, and epidemiological information. The expected result is Negative.  Fact Sheet for Patients: SugarRoll.be  Fact Sheet for Healthcare Providers: https://www.woods-mathews.com/  This test is not yet approved or cleared by the [place] FDA and  has been authorized for detection and/or diagnosis of SARS-CoV-2 by FDA under an Emergency Use Authorization (EUA). This EUA will remain  in effect (meaning this test  can be used) for the duration of the COVID-19 declaration under Se ction 564(b)(1) of the Act, 21 U.S.C. section 360bbb-3(b)(1), unless the authorization is terminated or revoked sooner.  Performed at [facility], [place] [address], [place],  [address]     Coagulation Studies: No results for input(s): LABPROT, INR in the last 72 hours.  Urinalysis: No results for input(s): COLORURINE, LABSPEC, PHURINE, GLUCOSEU, HGBUR, BILIRUBINUR, KETONESUR, PROTEINUR, UROBILINOGEN, NITRITE, LEUKOCYTESUR in the last 72  hours.  Invalid input(s): APPERANCEUR    Imaging: No results found.   Medications:     atorvastatin  40 mg Oral Daily   cefdinir  300 mg Oral Q24H   clopidogrel  75 mg Oral QHS   diltiazem  60 mg Oral Q8H   enoxaparin (LOVENOX) injection  30 mg Subcutaneous Q24H   ferrous sulfate  325 mg Oral TID WC   guaiFENesin  600 mg Oral BID   insulin aspart  0-9 Units Subcutaneous TID WC   insulin aspart  2 Units Subcutaneous TID WC   insulin glargine-yfgn  24 Units Subcutaneous QHS   metoprolol tartrate  100 mg Oral BID   pantoprazole  40 mg Oral Daily   vitamin B-12  1,000 mcg Oral Daily   acetaminophen **OR** acetaminophen, chlorpheniramine-HYDROcodone, fluticasone, ipratropium-albuterol, magnesium hydroxide, nitroGLYCERIN, ondansetron **OR** ondansetron (ZOFRAN) IV, traZODone  Assessment/ Plan:  Ryan Mcclure is a 87 y.o. Black male with congestive heart failure, aortic valve replacement, diabetes mellitus type II, hypertension, coronary artery disease status post CABG, CVA, hyperlipidemia, AAA, peripheral vascular disease, who is admitted to [facility] on 01/19/2021 for Acute CHF (congestive heart failure) ([place]) [I50.9] Acute midline low back pain without sciatica [M54.50] Community acquired pneumonia, unspecified laterality [J18.9] Acute congestive heart failure, unspecified heart failure type ([place]) [I50.9] Sepsis, due to unspecified organism, unspecified whether  acute organ dysfunction present ([place]) [A41.9]  Acute kidney injury on chronic kidney disease stage IV with proteinuria: baseline creatinine of 2.66, GFR of 23, on 07/07/20. No IV contrast exposure. Timing is consistent with overdiuresis leading to ATN.  - Continue to hold diuretics.  - Holding telmisartan - no indication for dialysis at this time.  Hypertension with chronic kidney disease: 143/50. Well controlled with diltiazem and metoprolol.   Acute exacerbation of systolic congestive heart failure: echocardiogram with EF of 45-50%.  - holding diuretics.  - appreciate cardiology input.   Anemia with chronic kidney disease: hemoglobin 8.9, normocytic.  - check iron studies  Diabetes mellitus type II with chronic kidney disease: insulin dependent. Hemoglobin A1c of 6.4%.  - recommend to not restart metformin on discharge.     LOS: 6 Jessicia Napolitano 12/26/202211:02 AM

## 2021-01-23 NOTE — Care Management Important Message (Signed)
Important Message  Patient Details  Name: Ryan Mcclure MRN: [id number] Date of Birth: August 17, 1931   Medicare Important Message Given:  Yes     Dannette Barbara 01/23/2021, 2:28 PM

## 2021-01-23 NOTE — Progress Notes (Signed)
[facility] Cardiology    SUBJECTIVE: Patient resting comfortably slightly more lethargic no fever chills sweats no significant shortness of breath heart rate much improved   Vitals:   01/23/21 0026 01/23/21 0359 01/23/21 0401 01/23/21 0500  BP: (!) 124/56 136/69 (!) 139/56   Pulse: (!) 58 (!) 58 (!) 57   Resp: 20 19 19    Temp: 97.7 F (36.5 C) (!) 97.5 F (36.4 C) (!) 97.5 F (36.4 C)   TempSrc:      SpO2: 93% 94% 96%   Weight:    93.4 kg  Height:         Intake/Output Summary (Last 24 hours) at 01/23/2021 0753 Last data filed at 01/23/2021 0400 Gross per 24 hour  Intake 240 ml  Output 200 ml  Net 40 ml      PHYSICAL EXAM  General: Well developed, well nourished, in no acute distress HEENT:  Normocephalic and atramatic Neck:  No JVD.  Lungs: Clear bilaterally to auscultation and percussion. Heart: HRRR . Normal S1 and S2 without gallops or murmurs.  Abdomen: Bowel sounds are positive, abdomen soft and non-tender  Msk:  Back normal, normal gait. Normal strength and tone for age. Extremities: No clubbing, cyanosis or edema.   Neuro: Alert and oriented X 3. Psych:  Good affect, responds appropriately   LABS: Basic Metabolic Panel: Recent Labs    01/22/21 0546 01/23/21 0603  NA 140 140  K 3.9 4.3  CL 107 108  CO2 24 24  GLUCOSE 145* 159*  BUN 62* 74*  CREATININE 3.86* 4.53*  CALCIUM 9.5 9.6  MG 2.3 2.5*   Liver Function Tests: No results for input(s): AST, ALT, ALKPHOS, BILITOT, PROT, ALBUMIN in the last 72 hours. No results for input(s): LIPASE, AMYLASE in the last 72 hours. CBC: Recent Labs    01/22/21 0546 01/23/21 0603  WBC 10.5 12.1*  HGB 8.0* 8.9*  HCT 25.5* 29.1*  MCV 83.9 85.3  PLT 139* 162   Cardiac Enzymes: No results for input(s): CKTOTAL, CKMB, CKMBINDEX, TROPONINI in the last 72 hours. BNP: Invalid input(s): POCBNP D-Dimer: No results for input(s): DDIMER in the last 72 hours. Hemoglobin A1C: No results for input(s): HGBA1C in the  last 72 hours. Fasting Lipid Panel: No results for input(s): CHOL, HDL, LDLCALC, TRIG, CHOLHDL, LDLDIRECT in the last 72 hours. Thyroid Function Tests: No results for input(s): TSH, T4TOTAL, T3FREE, THYROIDAB in the last 72 hours.  Invalid input(s): FREET3 Anemia Panel: No results for input(s): VITAMINB12, FOLATE, FERRITIN, TIBC, IRON, RETICCTPCT in the last 72 hours.  No results found.   Echo mild depressed left ventricular function EF around 45 to 50% well-seated bioprosthetic aortic  TELEMETRY: Sinus bradycardia rate of around 60:  ASSESSMENT AND PLAN:  Principal Problem:   Acute CHF (congestive heart failure) (HCC) Bradycardia Atrial fibrillation Tachybradycardia syndrome Obesity History of aortic valve replacement bioprosthetic History of coronary bypass surgery  History of GI bleeding Diabetes CVA with carotid endarterectomy Mild cardiomyopathy Right-sided pneumonia  Plan Continue telemetry Reduce metoprolol because of significant bradycardia Poor anticoagulation candidate because of previous GI bleeding Continue diabetes management and control Continue to monitor chronic renal sufficiency stage IV follow-up with nephrology Hypertension reasonably controlled on amlodipine 5 twice a day as well as metoprolol as well as hydralazine Recommend physical therapy to improve activity  Yolonda Kida, MD 01/23/2021 7:53 AM

## 2021-01-23 NOTE — Progress Notes (Signed)
Patient was placed on bipap earlier in the shift. Patient is awake and continuously kept taking bipap mask off. Charge RN just took patient off and placed back on nasal cannula.

## 2021-01-23 NOTE — Progress Notes (Signed)
Serial troponins ordred by Dr Clayborn Bigness and by Dr Billie Ruddy, RN notified lab due to missing lab draws, first call to lab was told that there was a lab tech on the unit and they would call to ask them to draw stat troponin (approx. 17:15). Lab called again at 1820 to check on late lab draw, tech states that it was drawn at 1745.

## 2021-01-23 NOTE — Progress Notes (Signed)
Occupational Therapy Treatment Patient Details Name: Ryan Mcclure MRN: [id number] DOB: 1931/09/19 Today's Date: 01/23/2021   History of present illness Patient is a 87 year old male with medical history significant for systolic CHF, stage III chronic kidney disease, type 2 diabetes mellitus, CVA, GERD and dyslipidemia, presented to the ER with acute onset of low back pain for the last couple of days with radiculopathy. Acute on chronic systolic CHF, Right-sided community-acquired pneumonia, Acute hypoxic respiratory failure. Imaging with chronically stable L1-L2 compression fractures   OT comments  Pt seen for OT treatment this date.  Pt in bed on arrival, pt lethargic, able to arouse briefly but pt tends to keep his eyes closed.  Pt able to participate in grooming tasks from bed level but required cues for task and assist for donning glasses after washing his face.  Pt required assist with self feeding this date and was not alert enough to consume meal or participate in [place] activities.  Staff in to take blood for further testing.  Continue to work towards goals in plan of care to maximize safety and independence in necessary daily tasks.    Recommendations for follow up therapy are one component of a multi-disciplinary discharge planning process, led by the attending physician.  Recommendations may be updated based on patient status, additional functional criteria and insurance authorization.    Follow Up Recommendations  Skilled nursing-short term rehab (<3 hours/day)    Assistance Recommended at Discharge Frequent or constant Supervision/Assistance  Equipment Recommendations  Other (comment)    Recommendations for Other Services      Precautions / Restrictions Precautions Precautions: Fall Restrictions Weight Bearing Restrictions: No       Mobility Bed Mobility Overal bed mobility: Needs Assistance Bed Mobility: Supine to Sit;Sit to Supine     Supine to sit: Max assist Sit  to supine: Max assist   General bed mobility comments: assistance for trunk and BLE support. increased time for motor planning and sequencing. increased effort required during mobility    Transfers Overall transfer level: Needs assistance                 General transfer comment: Pt not alert enough today to perform transfers safely.     Balance   Sitting-balance support: Feet supported Sitting balance-Leahy Scale: Fair Sitting balance - Comments: no loss of balance in sitting. close stand by assistance for safety                                   ADL either performed or assessed with clinical judgement   ADL Overall ADL's : Needs assistance/impaired Eating/Feeding: Minimal assistance Eating/Feeding Details (indicate cue type and reason): Pt had food tray in front of him, barely touched.  Required assist today to intiate self feeding. Grooming: Set up Grooming Details (indicate cue type and reason): Decreased attention and initiation of grooming, stimulated by placing wet cloth into his dominant hand and he was able to wash his face, able to take off his glasses but required assist to put them back on correctly.                                    Extremity/Trunk Assessment              Vision Baseline Vision/History: 1 Wears glasses     Perception  Praxis      Cognition Arousal/Alertness: Lethargic Behavior During Therapy: WFL for tasks assessed/performed Overall Cognitive Status: No family/caregiver present to determine baseline cognitive functioning                                 General Comments: Pt requires cues to attend to task and to open his eyes.          Exercises Exercises: General Lower Extremity;Other exercises General Exercises - Lower Extremity Ankle Circles/Pumps: AAROM;Strengthening;Both;10 reps;Supine Hip ABduction/ADduction: AAROM;Strengthening;Both;10 reps;Supine Other Exercises Other  Exercises: verbal and tactile cues for participation   Shoulder Instructions       General Comments after return to bed, Sp02 92%, heart rate 63bpm, and blood pressure 126/53 mmHg    Pertinent Vitals/ Pain       Pain Assessment: No/denies pain  Home Living                                          Prior Functioning/Environment              Frequency  Min 2X/week        Progress Toward Goals  OT Goals(current goals can now be found in the care plan section)  Progress towards OT goals: Not progressing toward goals - comment (pt limited by lethargy)  Acute Rehab OT Goals Patient Stated Goal: to go home OT Goal Formulation: With patient Time For Goal Achievement: 02/02/21  Plan      Co-evaluation                 AM-PAC OT "6 Clicks" Daily Activity     Outcome Measure   Help from another person eating meals?: A Little Help from another person taking care of personal grooming?: A Little Help from another person toileting, which includes using toliet, bedpan, or urinal?: A Lot Help from another person bathing (including washing, rinsing, drying)?: A Lot Help from another person to put on and taking off regular upper body clothing?: A Lot Help from another person to put on and taking off regular lower body clothing?: A Lot 6 Click Score: 14    End of Session Equipment Utilized During Treatment: Gait belt;Rolling walker (2 wheels)  OT Visit Diagnosis: Unsteadiness on feet (R26.81);Muscle weakness (generalized) (M62.81)   Activity Tolerance Patient limited by lethargy   Patient Left in bed;with call bell/phone within reach;with bed alarm set   Nurse Communication          Time: 8850-2774 OT Time Calculation (min): 17 min  Charges: OT General Charges $OT Visit: 1 Visit OT Treatments $Self Care/Home Management : 8-22 mins  Harumi Yamin T Leylanie [place]man[place], OTR/L, CLT   Valta Dillon 01/23/2021, 3:14 PM

## 2021-01-23 NOTE — Progress Notes (Signed)
Patient noted to be moaning and calling out of room, RN in to assess patient who is restless in bed, oxygen pulled off, and complaining of 10/10 chest pain. Sublingual nitro given x 2 with relief in chest pain, 12 lead EKG obtained and placed on chart, MD  and cardiology notified with orders to cycle troponins placed. Patient currently resting comfortably in bed at this time.

## 2021-01-24 ENCOUNTER — Inpatient Hospital Stay: Payer: Medicare Other

## 2021-01-24 DIAGNOSIS — I50811 Acute right heart failure: Secondary | ICD-10-CM | POA: Diagnosis not present

## 2021-01-24 LAB — CBC
HCT: 26.4 % — ABNORMAL LOW (ref 39.0–52.0)
Hemoglobin: 8.1 g/dL — ABNORMAL LOW (ref 13.0–17.0)
MCH: 26.1 pg (ref 26.0–34.0)
MCHC: 30.7 g/dL (ref 30.0–36.0)
MCV: 85.2 fL (ref 80.0–100.0)
Platelets: 177 10*3/uL (ref 150–400)
RBC: 3.1 MIL/uL — ABNORMAL LOW (ref 4.22–5.81)
RDW: 14.6 % (ref 11.5–15.5)
WBC: 12 10*3/uL — ABNORMAL HIGH (ref 4.0–10.5)
nRBC: 0.2 % (ref 0.0–0.2)

## 2021-01-24 LAB — HEPATITIS B SURFACE ANTIGEN: Hepatitis B Surface Ag: NONREACTIVE

## 2021-01-24 LAB — IRON AND TIBC
Iron: 30 ug/dL — ABNORMAL LOW (ref 45–182)
Saturation Ratios: 26 % (ref 17.9–39.5)
TIBC: 115 ug/dL — ABNORMAL LOW (ref 250–450)
UIBC: 85 ug/dL

## 2021-01-24 LAB — FERRITIN: Ferritin: 544 ng/mL — ABNORMAL HIGH (ref 24–336)

## 2021-01-24 LAB — BASIC METABOLIC PANEL
Anion gap: 13 (ref 5–15)
BUN: 85 mg/dL — ABNORMAL HIGH (ref 8–23)
CO2: 23 mmol/L (ref 22–32)
Calcium: 9.8 mg/dL (ref 8.9–10.3)
Chloride: 106 mmol/L (ref 98–111)
Creatinine, Ser: 4.78 mg/dL — ABNORMAL HIGH (ref 0.61–1.24)
GFR, Estimated: 11 mL/min — ABNORMAL LOW (ref >60)
Glucose, Bld: 174 mg/dL — ABNORMAL HIGH (ref 70–99)
Potassium: 4.5 mmol/L (ref 3.5–5.1)
Sodium: 142 mmol/L (ref 135–145)

## 2021-01-24 LAB — MAGNESIUM: Magnesium: 2.5 mg/dL — ABNORMAL HIGH (ref 1.7–2.4)

## 2021-01-24 LAB — HEPATITIS C ANTIBODY: HCV Ab: NONREACTIVE

## 2021-01-24 LAB — HEPATITIS B SURFACE ANTIBODY,QUALITATIVE: Hep B S Ab: NONREACTIVE

## 2021-01-24 LAB — GLUCOSE, CAPILLARY
Glucose-Capillary: 156 mg/dL — ABNORMAL HIGH (ref 70–99)
Glucose-Capillary: 183 mg/dL — ABNORMAL HIGH (ref 70–99)
Glucose-Capillary: 202 mg/dL — ABNORMAL HIGH (ref 70–99)
Glucose-Capillary: 156 mg/dL — ABNORMAL HIGH (ref 70–99)

## 2021-01-24 LAB — HEPATITIS B CORE ANTIBODY, IGM: Hep B C IgM: NONREACTIVE

## 2021-01-24 MED ORDER — FUROSEMIDE 10 MG/ML IJ SOLN
40.0000 mg | Freq: Once | INTRAMUSCULAR | Status: AC
  Administered 2021-01-24: 18:00:00 40 mg via INTRAVENOUS
  Filled 2021-01-24: qty 4

## 2021-01-24 MED ORDER — HALOPERIDOL LACTATE 5 MG/ML IJ SOLN
2.0000 mg | Freq: Four times a day (QID) | INTRAMUSCULAR | Status: DC | PRN
  Administered 2021-01-24 – 2021-01-26 (×6): 2 mg via INTRAMUSCULAR
  Filled 2021-01-24 (×6): qty 1

## 2021-01-24 NOTE — Progress Notes (Signed)
Noted patient to be agitated during rounds. Wants to take off his Bipap mask. HR on 122-125bpm. Dr. Sidney Ace made aware.

## 2021-01-24 NOTE — Progress Notes (Signed)
[facility]    SUBJECTIVE: Patient somewhat lethargic reduced energy slight confusion denies any further chest pain no worsening shortness of breath is generalized weakness and fatigue   Vitals:   01/23/21 1955 01/23/21 2349 01/24/21 0148 01/24/21 0337  BP:  110/67  107/85  Pulse:  90  68  Resp:  17  16  Temp:  98.6 F (37 C)  98.7 F (37.1 C)  TempSrc:      SpO2: 95% 94%  90%  Weight:   92.9 kg   Height:         Intake/Output Summary (Last 24 hours) at 01/24/2021 0701 Last data filed at 01/24/2021 0013 Gross per 24 hour  Intake --  Output 400 ml  Net -400 ml      PHYSICAL EXAM  General: Well developed, well nourished, in no acute distress HEENT:  Normocephalic and atramatic Neck:  No JVD.  Lungs: Clear bilaterally to auscultation and percussion. Heart: HRRR . Normal S1 and S2 without gallops or murmurs.  Abdomen: Bowel sounds are positive, abdomen soft and non-tender  Msk:  Back normal, normal gait. Normal strength and tone for age. Extremities: No clubbing, cyanosis or edema.   Neuro: Alert and oriented X 3. Psych:  Good affect, responds appropriately   LABS: Basic Metabolic Panel: Recent Labs    01/22/21 0546 01/23/21 0603  NA 140 140  K 3.9 4.3  CL 107 108  CO2 24 24  GLUCOSE 145* 159*  BUN 62* 74*  CREATININE 3.86* 4.53*  CALCIUM 9.5 9.6  MG 2.3 2.5*   Liver Function Tests: No results for input(s): AST, ALT, ALKPHOS, BILITOT, PROT, ALBUMIN in the last 72 hours. No results for input(s): LIPASE, AMYLASE in the last 72 hours. CBC: Recent Labs    01/23/21 0603 01/24/21 0614  WBC 12.1* 12.0*  HGB 8.9* 8.1*  HCT 29.1* 26.4*  MCV 85.3 85.2  PLT 162 177   Cardiac Enzymes: No results for input(s): CKTOTAL, CKMB, CKMBINDEX, TROPONINI in the last 72 hours. BNP: Invalid input(s): POCBNP D-Dimer: No results for input(s): DDIMER in the last 72 hours. Hemoglobin A1C: No results for input(s): HGBA1C in the last 72 hours. Fasting Lipid  Panel: No results for input(s): CHOL, HDL, LDLCALC, TRIG, CHOLHDL, LDLDIRECT in the last 72 hours. Thyroid Function Tests: No results for input(s): TSH, T4TOTAL, T3FREE, THYROIDAB in the last 72 hours.  Invalid input(s): FREET3 Anemia Panel: No results for input(s): VITAMINB12, FOLATE, FERRITIN, TIBC, IRON, RETICCTPCT in the last 72 hours.  US RENAL  Result Date: 01/23/2021 CLINICAL DATA:  Acute kidney failure EXAM: RENAL / URINARY TRACT ULTRASOUND COMPLETE COMPARISON:  None. FINDINGS: Right Kidney: Renal measurements: 10.2 x 5.2 x 6.2 cm = volume: 172 mL. Echogenicity within normal limits. No mass or hydronephrosis visualized. Left Kidney: Renal measurements: 11.7 x 6.1 x 5.5 cm = volume: 204 mL. Echogenicity within normal limits. No mass or hydronephrosis visualized. Bladder: Appears normal for degree of bladder distention. Other: None. IMPRESSION: No acute findings.  No hydronephrosis. Electronically Signed   By: Rolm Baptise M.D.   On: 01/23/2021 12:43     Echo mild reduced left ventricular function EF around 45 to 50%  TELEMETRY: Mostly sinus rhythm with PACs rate reasonably controlled less than 100:  ASSESSMENT AND PLAN:  Principal Problem:   Acute CHF (congestive heart failure) (HCC) Acute on chronic renal insufficiency Obesity Hypertension Atrial fibrillation Generalized fatigue Angina . Plan Continue telemetry EKGs Short-term anticoagulation for A. fib DVT History of GI bleed ,  Poor anticoagulation candidate long-term Continue hypertensive medication with diltiazem metoprolol Continue rate control with metoprolol and diltiazem Hypertension better controlled recently on metoprolol and diltiazem patient was switched from amlodipine and hydralazine in the past Atrial fibrillation rapid ventricular response earlier in his hospital stay metoprolol was as high as 100 twice a day but has since been reduced because of conversion to sinus rhythm and bradycardia with relative  hypotension.  Now he is on metoprolol 25 twice a day Recent episode of angina with flat troponins of around little over 200 EKG unchanged recommend conservative management Congestive heart failure appears to be compensated but now at the expense of worsening renal insufficiency may have to reduce diuretic therapy Mild relative hypotension may reduce Cardizem to 30 every 6 hours.  Metoprolol has been reduced recently because of bradycardia and relative hypotension as well Conservative cardiac care at this point.  Continue to treat medically     Yolonda Kida, MD 01/24/2021 7:01 AM

## 2021-01-24 NOTE — Progress Notes (Signed)
[facility]  ROUNDING NOTE   Subjective:   [name] was admitted to [facility] on 01/16/2021 for Acute CHF (congestive heart failure) ([place]) [I50.9] Acute midline low back pain without sciatica [M54.50] Community acquired pneumonia, unspecified laterality [J18.9] Acute congestive heart failure, unspecified heart failure type ([place]) [I50.9] Sepsis, due to unspecified organism, unspecified whether acute organ dysfunction present [facility]) [A41.9]  Patient seen resting quietly Alert Remains on 6L [place], baseline room air Seen later sitting up in bed, eating breakfast   Objective:  Vital signs in last 24 hours:  Temp:  [97 F (36.1 C)-98.7 F (37.1 C)] 97 F (36.1 C) (12/27 1224) Pulse Rate:  [61-105] 68 (12/27 1224) Resp:  [16-23] 17 (12/27 1224) BP: (107-136)/(51-85) 122/51 (12/27 1224) SpO2:  [85 %-100 %] 100 % (12/27 1232) FiO2 (%):  [40 %] 40 % (12/27 1232) Weight:  [92.9 kg] 92.9 kg (12/27 0148)  Weight change: -0.5 kg Filed Weights   01/22/21 0500 01/23/21 0500 01/24/21 0148  Weight: 93.2 kg 93.4 kg 92.9 kg    Intake/Output: I/O last 3 completed shifts: In: -  Out: 600 [Urine:600]   Intake/Output this shift:  No intake/output data recorded.  Physical Exam: General: NAD, laying in bed  Head: Normocephalic, atraumatic. Moist oral mucosal membranes  Eyes: Anicteric  Lungs:  Clear to auscultation, 6L [place] O2  Heart: Regular rate and rhythm  Abdomen:  Soft, nontender  Extremities:  no peripheral edema.  Neurologic: Nonfocal, moving all four extremities  Skin: No lesions  Access: none    Basic Metabolic Panel: Recent Labs  Lab 01/20/21 0454 01/20/21 0855 01/21/21 1106 01/22/21 0546 01/23/21 0603 01/24/21 0614  NA  --  139 136 140 140 142  K  --  3.5 4.3 3.9 4.3 4.5  CL  --  107 103 107 108 106  CO2  --  23 25 24 24 23   GLUCOSE  --  150* 305* 145* 159* 174*  BUN  --  49* 57* 62* 74* 85*  CREATININE  --  3.26* 3.06* 3.86* 4.53* 4.78*  CALCIUM   --  9.3 9.7 9.5 9.6 9.8  MG 1.7  --  2.4 2.3 2.5* 2.5*     Liver Function Tests: Recent Labs  Lab 01/08/2021 2051 01/19/21 0636  AST 17 16  ALT 12 12  ALKPHOS 77 59  BILITOT 0.9 0.8  PROT 8.2* 6.5  ALBUMIN 3.5 2.7*    No results for input(s): LIPASE, AMYLASE in the last 168 hours. No results for input(s): AMMONIA in the last 168 hours.  CBC: Recent Labs  Lab 01/26/2021 2051 01/18/21 0324 01/19/21 0636 01/21/21 1106 01/22/21 0546 01/23/21 0603 01/24/21 0614  WBC 14.3*   < > 13.2* 13.8* 10.5 12.1* 12.0*  NEUTROABS 11.1*  --  10.6*  --   --   --   --   HGB 10.6*   < > 8.2* 9.1* 8.0* 8.9* 8.1*  HCT 33.6*   < > 25.7* 29.6* 25.5* 29.1* 26.4*  MCV 83.8   < > 83.4 85.3 83.9 85.3 85.2  PLT 139*   < > 119* 155 139* 162 177   < > = values in this interval not displayed.     Cardiac Enzymes: No results for input(s): CKTOTAL, CKMB, CKMBINDEX, TROPONINI in the last 168 hours.  BNP: Invalid input(s): POCBNP  CBG: Recent Labs  Lab 01/23/21 1143 01/23/21 1617 01/23/21 2139 01/24/21 0756 01/24/21 1226  GLUCAP 170* 138* 173* 156* 183*  Microbiology: Results for orders placed or performed during the hospital encounter of 01/02/2021  Resp Panel by RT-PCR (Flu A&B, Covid) Nasopharyngeal Swab     Status: None   Collection Time: 01/16/2021  8:55 PM   Specimen: Nasopharyngeal Swab; Nasopharyngeal(NP) swabs in vial transport medium  Result Value Ref Range Status   SARS Coronavirus 2 by RT PCR NEGATIVE NEGATIVE Final    Comment: (NOTE) SARS-CoV-2 target nucleic acids are NOT DETECTED.  The SARS-CoV-2 RNA is generally detectable in upper respiratory specimens during the acute phase of infection. The lowest concentration of SARS-CoV-2 viral copies this assay can detect is 138 copies/mL. A negative result does not preclude SARS-Cov-2 infection and should not be used as the sole basis for treatment or other patient management decisions. A negative result may occur with   improper specimen collection/handling, submission of specimen other than nasopharyngeal swab, presence of viral mutation(s) within the areas targeted by this assay, and inadequate number of viral copies(<138 copies/mL). A negative result must be combined with clinical observations, patient history, and epidemiological information. The expected result is Negative.  Fact Sheet for Patients:  EntrepreneurPulse.com.au  Fact Sheet for Healthcare Providers:  IncredibleEmployment.be  This test is no t yet approved or cleared by the [place] FDA and  has been authorized for detection and/or diagnosis of SARS-CoV-2 by FDA under an Emergency Use Authorization (EUA). This EUA will remain  in effect (meaning this test can be used) for the duration of the COVID-19 declaration under Section 564(b)(1) of the Act, 21 U.S.C.section 360bbb-3(b)(1), unless the authorization is terminated  or revoked sooner.       Influenza A by PCR NEGATIVE NEGATIVE Final   Influenza B by PCR NEGATIVE NEGATIVE Final    Comment: (NOTE) The Xpert Xpress SARS-CoV-2/FLU/RSV plus assay is intended as an aid in the diagnosis of influenza from Nasopharyngeal swab specimens and should not be used as a sole basis for treatment. Nasal washings and aspirates are unacceptable for Xpert Xpress SARS-CoV-2/FLU/RSV testing.  Fact Sheet for Patients: EntrepreneurPulse.com.au  Fact Sheet for Healthcare Providers: IncredibleEmployment.be  This test is not yet approved or cleared by the [place] FDA and has been authorized for detection and/or diagnosis of SARS-CoV-2 by FDA under an Emergency Use Authorization (EUA). This EUA will remain in effect (meaning this test can be used) for the duration of the COVID-19 declaration under Section 564(b)(1) of the Act, 21 U.S.C. section 360bbb-3(b)(1), unless the authorization is terminated  or revoked.  Performed at [facility], [address], [place], [place] [address]   Urine Culture     Status: Abnormal   Collection Time: 01/18/2021  9:13 PM   Specimen: In/Out Cath Urine  Result Value Ref Range Status   Specimen Description   Final    IN/OUT CATH URINE Performed at [facility], [address], [place], [place] [address]    Special Requests   Final    NONE Performed at [facility], [address] [place],  [address]    Culture MULTIPLE SPECIES PRESENT, SUGGEST RECOLLECTION (A)  Final   Report Status 01/19/2021 FINAL  Final  Blood Culture (routine x 2)     Status: None   Collection Time: 12/30/2020  9:26 PM   Specimen: BLOOD  Result Value Ref Range Status   Specimen Description BLOOD LEFT ANTECUBITAL  Final   Special Requests   Final    BOTTLES DRAWN AEROBIC AND ANAEROBIC Blood Culture results may not be optimal due to  an excessive volume of blood received in culture bottles   Culture   Final    NO GROWTH 5 DAYS Performed at [facility], [address] [place], [place] [address]    Report Status 01/22/2021 FINAL  Final  Blood Culture (routine x 2)     Status: None   Collection Time: 01/13/2021  9:26 PM   Specimen: BLOOD  Result Value Ref Range Status   Specimen Description BLOOD BLOOD RIGHT HAND  Final   Special Requests   Final    BOTTLES DRAWN AEROBIC AND ANAEROBIC Blood Culture adequate volume   Culture   Final    NO GROWTH 5 DAYS Performed at [facility], [address] [place], [place] [address]    Report Status 01/22/2021 FINAL  Final  Culture, blood (x 2)     Status: None   Collection Time: 01/18/21  3:24 AM   Specimen: BLOOD  Result Value Ref Range Status   Specimen Description BLOOD LEFT HAND  Final   Special Requests   Final    BOTTLES DRAWN AEROBIC AND ANAEROBIC Blood Culture adequate volume   Culture   Final    NO GROWTH 5 DAYS Performed at [facility], [address], [place], [place] [address]    Report Status 01/23/2021 FINAL  Final  Culture, blood (x 2)     Status: None   Collection Time: 01/18/21  3:24 AM   Specimen: BLOOD  Result Value Ref Range Status   Specimen Description BLOOD RIGHT HAND  Final   Special Requests   Final    BOTTLES DRAWN AEROBIC AND ANAEROBIC Blood Culture results may not be optimal due to an inadequate volume of blood received in culture bottles   Culture   Final    NO GROWTH 5 DAYS Performed at [facility], [address] [place], [place] [address]    Report Status 01/23/2021 FINAL  Final  SARS CORONAVIRUS 2 (TAT 6-24 HRS) Nasopharyngeal Nasopharyngeal Swab     Status: None   Collection Time: 01/20/21  8:00 PM   Specimen: Nasopharyngeal Swab  Result Value Ref Range Status   SARS Coronavirus 2 NEGATIVE NEGATIVE Final    Comment: (NOTE) SARS-CoV-2 target nucleic acids are NOT DETECTED.  The SARS-CoV-2 RNA is generally detectable in upper and lower respiratory specimens during the acute phase of infection. Negative results do not preclude SARS-CoV-2 infection, do not rule out co-infections with other pathogens, and should not be used as the sole basis for treatment or other patient management decisions. Negative results must be combined with clinical observations, patient history, and epidemiological information. The expected result is Negative.  Fact Sheet for Patients: SugarRoll.be  Fact Sheet for Healthcare Providers: https://www.woods-mathews.com/  This test is not yet approved or cleared by the [place] FDA and  has been authorized for detection and/or diagnosis of SARS-CoV-2 by FDA under an Emergency Use Authorization (EUA). This EUA will remain  in effect (meaning this test can be used) for the duration of the COVID-19 declaration under Se ction 564(b)(1) of the Act, 21 U.S.C. section 360bbb-3(b)(1), unless the authorization is terminated  or revoked sooner.  Performed at [facility], [place] [address], [place], [place] [address]     Coagulation Studies: No results for input(s): LABPROT, INR in the last 72 hours.  Urinalysis: No results for input(s): COLORURINE, LABSPEC, PHURINE, GLUCOSEU, HGBUR, BILIRUBINUR, KETONESUR, PROTEINUR, UROBILINOGEN, NITRITE, LEUKOCYTESUR in the last 72 hours.  Invalid input(s): APPERANCEUR  Imaging: US RENAL  Result Date: 01/23/2021 CLINICAL DATA:  Acute kidney failure EXAM: RENAL / URINARY TRACT ULTRASOUND COMPLETE COMPARISON:  None. FINDINGS: Right Kidney: Renal measurements: 10.2 x 5.2 x 6.2 cm = volume: 172 mL. Echogenicity within normal limits. No mass or hydronephrosis visualized. Left Kidney: Renal measurements: 11.7 x 6.1 x 5.5 cm = volume: 204 mL. Echogenicity within normal limits. No mass or hydronephrosis visualized. Bladder: Appears normal for degree of bladder distention. Other: None. IMPRESSION: No acute findings.  No hydronephrosis. Electronically Signed   By: Rolm Baptise M.D.   On: 01/23/2021 12:43   DG Chest Port 1 View  Result Date: 01/24/2021 CLINICAL DATA:  Hypoxia. EXAM: PORTABLE CHEST 1 VIEW COMPARISON:  01/26/2021 FINDINGS: Moderate enlargement of the cardiopericardial silhouette with indistinct pulmonary vasculature and hazy bilateral airspace opacities in both lungs along with a few Kerley B lines best seen in the left lung. Obscured left hemidiaphragm due to airspace opacity. Hazy indistinct opacity along the right hemidiaphragm with partial obscuration. Aortic valve prosthesis. Aortic atherosclerotic calcification. Prior CABG. Thoracic spondylosis. IMPRESSION: 1. Indistinct pulmonary vasculature with Kerley B lines and patchy bilateral airspace opacities favoring acute pulmonary edema. 2. Obscured left hemidiaphragm, compatible with more confluent airspace opacity at the left lung base potentially from atelectasis or pneumonia. A component of left pleural effusion  is not excluded. There is a lesser degree of hazy obscuration of the right hemidiaphragm. 3. Thoracic spondylosis. 4.  Aortic Atherosclerosis (ICD10-I70.0). 5. Prior CABG.  Aortic valve prosthesis. Electronically Signed   By: Van Clines M.D.   On: 01/24/2021 11:25     Medications:     atorvastatin  40 mg Oral Daily   clopidogrel  75 mg Oral QHS   diltiazem  60 mg Oral Q8H   ferrous sulfate  325 mg Oral TID WC   guaiFENesin  600 mg Oral BID   heparin injection (subcutaneous)  5,000 Units Subcutaneous Q8H   insulin aspart  0-9 Units Subcutaneous TID WC   insulin aspart  2 Units Subcutaneous TID WC   insulin glargine-yfgn  24 Units Subcutaneous QHS   metoprolol tartrate  25 mg Oral BID   pantoprazole  40 mg Oral Daily   vitamin B-12  1,000 mcg Oral Daily   acetaminophen **OR** acetaminophen, chlorpheniramine-HYDROcodone, fluticasone, ipratropium-albuterol, magnesium hydroxide, nitroGLYCERIN, ondansetron **OR** ondansetron (ZOFRAN) IV, traZODone  Assessment/ Plan:  [name] is a 87 y.o. Black male with congestive heart failure, aortic valve replacement, diabetes mellitus type II, hypertension, coronary artery disease status post CABG, CVA, hyperlipidemia, AAA, peripheral vascular disease, who is admitted to [facility] on 01/14/2021 for Acute CHF (congestive heart failure) ([place]) [I50.9] Acute midline low back pain without sciatica [M54.50] Community acquired pneumonia, unspecified laterality [J18.9] Acute congestive heart failure, unspecified heart failure type ([place]) [I50.9] Sepsis, due to unspecified organism, unspecified whether acute organ dysfunction present ([place]) [A41.9]  Acute kidney injury on chronic kidney disease stage IV with proteinuria: baseline creatinine of 2.66, GFR of 23, on 07/07/20. No IV contrast exposure. Timing is consistent with overdiuresis leading to ATN. No indication for dialysis at this time. - Continue to hold diuretics.and telmasartan -Creatinine  increased to 4.8 - Will continue to monitor urine output   Hypertension with chronic kidney disease: 122/51 Well controlled with diltiazem and metoprolol.   Acute exacerbation of systolic congestive heart failure: echocardiogram with EF of 45-50%.  - holding diuretics remain held - appreciate cardiology input.   Anemia with chronic kidney disease: hemoglobin 8.9, normocytic.  -  iron studies indicate inflammation, will continue to monitor  Diabetes mellitus type II with chronic kidney disease: insulin dependent. Hemoglobin A1c of 6.4%.  - recommend to not restart metformin on discharge.     LOS: 7   12/27/202212:43 PM

## 2021-01-24 NOTE — Progress Notes (Signed)
Continued to instruct pt not to take off mask, Haldol 2mg =0.56mL given via IM. Will continue to monitor.

## 2021-01-24 NOTE — Progress Notes (Signed)
PROGRESS NOTE   Ryan Mcclure  XNT:[id number] DOB: [birth year] DOA: 01/11/2021 PCP: Ryan Pink, MD  Brief Narrative:   Ryan Mcclure is a 87 year old white male [place] [facility] resident HFrEF, CKD 4, DM TY 2, CVA with carotid endarterectomy, AoV replacement bioprosthetic valve CABG and stent August 2012 followed at [facility] Admission for GI bleed 12/25/2019-5 mm polyp present at that time-prior GI bleed 2017   Presented 12/22 [facility] ED low back pain in the setting of mild cough X 3 weeks-found to have oxygen 87% on room air and started on oxygen, T-max 100.5 mildly hypotensive CXR 12/20 mild vascular congestion  Hospital-Problem based course  Troponin elevation likely 2/2 demand ischemia --complained of chest pain on 12/26, Trop 262 but then trended down.  --cardiology already on board.  Acute hypoxemic respiratory failure 2/2 Pulm edema --not on home O2, O2 requirement increased to 6L today.  CXR today showed signs of pulm edema.  Discussed with nephrology about diuresis. Plan: --diuretics per nephrology --Continue supplemental O2 to keep sats >=92%, wean as tolerated  Acute hypercapnic respiratory failure --may be contributing to pt's lethargy. --ABG pH 7.28, pCO2 54 --started on BiPAP, which pt tolerates intermittently  Acute decompensated systolic heart failure  EF 45-50% on 12/21 --diuretic was held due to increasing Cr, however, could be cardiorenal syndrome, so discussed with nephro to resume diuresis. --diuretics per nephrology  AKI on CKD 4 --last dose of IV lasix 20 mg on 12/23, however, Cr continued to trend up since then, from 3.26 to 4.78 this morning. --Sees Dr. Juleen Mcclure in OP setting --nephro consulted Plan: --diuretics per nephrology --hold telmisartan  Right-sided pneumonia Completed 3 days ofceftriaxone and azithromycin and 4 days of Omnicef 300   Atrial valve bioprosthetic replacement, systemic 08/2010  PVCs/SVT --Not felt to be afib--predominant NSR  with PVC --cont metop  Diabetes mellitus type 2 -usually on metformin + insulin at home -A1c was 6.4 --continuing Lantus to 24 units --mealtime 2u TID  HTN --BP varied --cont Lopressor and dilt, as ordered by cardio  Right upper lobe nodule 1.2 cm --outpatient CT in 3 to 6 months   DVT prophylaxis: Heparin SQ Code Status: Full code  Family Communication: family updated at bedside today Status is: inpatient Dispo:   The patient is from: ILF Anticipated d/c is to: SNF Anticipated d/c date is: undetermined Patient currently is not medically stable to d/c due to: worsening Cr and O2 requirement    Consultants:  Cardiology   Procedures: n  Antimicrobials:   Azithromycin and ceftriaxone from 12/21  Subjective: Pt couldn't keep BiPAP on, kept trying to remove it.  O2 requirement went up today, to 6L.  CXR showed increased pulm edema.  Discussed with nephrology about diuresis.   Objective: Vitals:   01/24/21 1628 01/24/21 1642 01/24/21 1653 01/24/21 2007  BP: 122/61   (!) 152/65  Pulse: (!) 113   99  Resp:  14  (!) 25  Temp: (!) 97.3 F (36.3 C)   99.2 F (37.3 C)  TempSrc:      SpO2: 92%  94% 95%  Weight:      Height:        Intake/Output Summary (Last 24 hours) at 01/24/2021 2040 Last data filed at 01/24/2021 1830 Gross per 24 hour  Intake 0 ml  Output 400 ml  Net -400 ml   Filed Weights   01/22/21 0500 01/23/21 0500 01/24/21 0148  Weight: 93.2 kg 93.4 kg 92.9 kg    Examination:  Constitutional: somnolent CV: No cyanosis.   RESP: on BiPAP Extremities: No effusions, edema in BLE SKIN: warm, dry   Data Reviewed: personally reviewed   CBC    Component Value Date/Time   WBC 12.0 (H) 01/24/2021 0614   RBC 3.10 (L) 01/24/2021 0614   HGB 8.1 (L) 01/24/2021 0614   HGB 11.6 (L) 10/10/2012 0830   HCT 26.4 (L) 01/24/2021 0614   HCT 35.7 (L) 10/10/2012 0830   PLT 177 01/24/2021 0614   PLT 108 (L) 10/10/2012 0830   MCV 85.2 01/24/2021 0614    MCV 79 (L) 10/10/2012 0830   MCH 26.1 01/24/2021 0614   MCHC 30.7 01/24/2021 0614   RDW 14.6 01/24/2021 0614   RDW 15.5 (H) 10/10/2012 0830   LYMPHSABS 0.9 01/19/2021 0636   LYMPHSABS 1.5 10/10/2012 0830   MONOABS 1.5 (H) 01/19/2021 0636   MONOABS 0.9 10/10/2012 0830   EOSABS 0.1 01/19/2021 0636   EOSABS 0.3 10/10/2012 0830   BASOSABS 0.0 01/19/2021 0636   BASOSABS 0.1 10/10/2012 0830   CMP Latest Ref Rng & Units 01/24/2021 01/23/2021 01/22/2021  Glucose 70 - 99 mg/dL 174(H) 159(H) 145(H)  BUN 8 - 23 mg/dL 85(H) 74(H) 62(H)  Creatinine 0.61 - 1.24 mg/dL 4.78(H) 4.53(H) 3.86(H)  Sodium 135 - 145 mmol/L 142 140 140  Potassium 3.5 - 5.1 mmol/L 4.5 4.3 3.9  Chloride 98 - 111 mmol/L 106 108 107  CO2 22 - 32 mmol/L 23 24 24   Calcium 8.9 - 10.3 mg/dL 9.8 9.6 9.5  Total Protein 6.5 - 8.1 g/dL - - -  Total Bilirubin 0.3 - 1.2 mg/dL - - -  Alkaline Phos 38 - 126 U/L - - -  AST 15 - 41 U/L - - -  ALT 0 - 44 U/L - - -     Radiology Studies: US RENAL  Result Date: 01/23/2021 CLINICAL DATA:  Acute kidney failure EXAM: RENAL / URINARY TRACT ULTRASOUND COMPLETE COMPARISON:  None. FINDINGS: Right Kidney: Renal measurements: 10.2 x 5.2 x 6.2 cm = volume: 172 mL. Echogenicity within normal limits. No mass or hydronephrosis visualized. Left Kidney: Renal measurements: 11.7 x 6.1 x 5.5 cm = volume: 204 mL. Echogenicity within normal limits. No mass or hydronephrosis visualized. Bladder: Appears normal for degree of bladder distention. Other: None. IMPRESSION: No acute findings.  No hydronephrosis. Electronically Signed   By: Ryan Mcclure M.D.   On: 01/23/2021 12:43   DG Chest Port 1 View  Result Date: 01/24/2021 CLINICAL DATA:  Hypoxia. EXAM: PORTABLE CHEST 1 VIEW COMPARISON:  01/10/2021 FINDINGS: Moderate enlargement of the cardiopericardial silhouette with indistinct pulmonary vasculature and hazy bilateral airspace opacities in both lungs along with a few Kerley B lines best seen in the left  lung. Obscured left hemidiaphragm due to airspace opacity. Hazy indistinct opacity along the right hemidiaphragm with partial obscuration. Aortic valve prosthesis. Aortic atherosclerotic calcification. Prior CABG. Thoracic spondylosis. IMPRESSION: 1. Indistinct pulmonary vasculature with Kerley B lines and patchy bilateral airspace opacities favoring acute pulmonary edema. 2. Obscured left hemidiaphragm, compatible with more confluent airspace opacity at the left lung base potentially from atelectasis or pneumonia. A component of left pleural effusion is not excluded. There is a lesser degree of hazy obscuration of the right hemidiaphragm. 3. Thoracic spondylosis. 4.  Aortic Atherosclerosis (ICD10-I70.0). 5. Prior CABG.  Aortic valve prosthesis. Electronically Signed   By: Van Clines M.D.   On: 01/24/2021 11:25     Scheduled Meds:  atorvastatin  40 mg Oral Daily  clopidogrel  75 mg Oral QHS   diltiazem  60 mg Oral Q8H   ferrous sulfate  325 mg Oral TID WC   guaiFENesin  600 mg Oral BID   heparin injection (subcutaneous)  5,000 Units Subcutaneous Q8H   insulin aspart  0-9 Units Subcutaneous TID WC   insulin aspart  2 Units Subcutaneous TID WC   insulin glargine-yfgn  24 Units Subcutaneous QHS   metoprolol tartrate  25 mg Oral BID   pantoprazole  40 mg Oral Daily   vitamin B-12  1,000 mcg Oral Daily   Continuous Infusions:     LOS: 7 days    Enzo Bi, MD [facility] To contact the attending provider between 7A-7P or the covering provider during after hours 7P-7A, please log into the web site [email or web address] and access using universal  password for that web site. If you do not have the password, please call the hospital operator.  01/24/2021, 8:40 PM

## 2021-01-25 DIAGNOSIS — I10 Essential (primary) hypertension: Secondary | ICD-10-CM

## 2021-01-25 DIAGNOSIS — I5021 Acute systolic (congestive) heart failure: Secondary | ICD-10-CM | POA: Diagnosis not present

## 2021-01-25 DIAGNOSIS — J810 Acute pulmonary edema: Secondary | ICD-10-CM

## 2021-01-25 DIAGNOSIS — J9601 Acute respiratory failure with hypoxia: Secondary | ICD-10-CM | POA: Diagnosis not present

## 2021-01-25 DIAGNOSIS — J9602 Acute respiratory failure with hypercapnia: Secondary | ICD-10-CM

## 2021-01-25 DIAGNOSIS — J189 Pneumonia, unspecified organism: Secondary | ICD-10-CM | POA: Diagnosis not present

## 2021-01-25 DIAGNOSIS — N184 Chronic kidney disease, stage 4 (severe): Secondary | ICD-10-CM

## 2021-01-25 LAB — BASIC METABOLIC PANEL
Anion gap: 12 (ref 5–15)
BUN: 92 mg/dL — ABNORMAL HIGH (ref 8–23)
CO2: 23 mmol/L (ref 22–32)
Calcium: 9.8 mg/dL (ref 8.9–10.3)
Chloride: 107 mmol/L (ref 98–111)
Creatinine, Ser: 4.56 mg/dL — ABNORMAL HIGH (ref 0.61–1.24)
GFR, Estimated: 12 mL/min — ABNORMAL LOW (ref >60)
Glucose, Bld: 189 mg/dL — ABNORMAL HIGH (ref 70–99)
Potassium: 4.3 mmol/L (ref 3.5–5.1)
Sodium: 142 mmol/L (ref 135–145)

## 2021-01-25 LAB — CBC
HCT: 28.3 % — ABNORMAL LOW (ref 39.0–52.0)
Hemoglobin: 8.5 g/dL — ABNORMAL LOW (ref 13.0–17.0)
MCH: 25.6 pg — ABNORMAL LOW (ref 26.0–34.0)
MCHC: 30 g/dL (ref 30.0–36.0)
MCV: 85.2 fL (ref 80.0–100.0)
Platelets: 191 10*3/uL (ref 150–400)
RBC: 3.32 MIL/uL — ABNORMAL LOW (ref 4.22–5.81)
RDW: 14.6 % (ref 11.5–15.5)
WBC: 11.5 10*3/uL — ABNORMAL HIGH (ref 4.0–10.5)
nRBC: 0.3 % — ABNORMAL HIGH (ref 0.0–0.2)

## 2021-01-25 LAB — GLUCOSE, CAPILLARY
Glucose-Capillary: 254 mg/dL — ABNORMAL HIGH (ref 70–99)
Glucose-Capillary: 229 mg/dL — ABNORMAL HIGH (ref 70–99)
Glucose-Capillary: 234 mg/dL — ABNORMAL HIGH (ref 70–99)
Glucose-Capillary: 216 mg/dL — ABNORMAL HIGH (ref 70–99)

## 2021-01-25 LAB — MAGNESIUM: Magnesium: 2.7 mg/dL — ABNORMAL HIGH (ref 1.7–2.4)

## 2021-01-25 MED ORDER — DILTIAZEM HCL 30 MG PO TABS
30.0000 mg | ORAL_TABLET | Freq: Three times a day (TID) | ORAL | Status: DC
  Administered 2021-01-25 – 2021-01-26 (×4): 30 mg via ORAL
  Filled 2021-01-25 (×4): qty 1

## 2021-01-25 MED ORDER — FUROSEMIDE 10 MG/ML IJ SOLN
40.0000 mg | Freq: Two times a day (BID) | INTRAMUSCULAR | Status: DC
  Administered 2021-01-25 – 2021-01-26 (×3): 40 mg via INTRAVENOUS
  Filled 2021-01-25 (×2): qty 4

## 2021-01-25 MED ORDER — POLYETHYLENE GLYCOL 3350 17 G PO PACK
17.0000 g | PACK | Freq: Every day | ORAL | Status: DC
  Administered 2021-01-25 – 2021-01-26 (×2): 17 g via ORAL
  Filled 2021-01-25 (×2): qty 1

## 2021-01-25 MED ORDER — DOCUSATE SODIUM 100 MG PO CAPS
100.0000 mg | ORAL_CAPSULE | Freq: Two times a day (BID) | ORAL | Status: DC
Start: 2021-01-25 — End: 2021-01-26
  Administered 2021-01-25 – 2021-01-26 (×2): 100 mg via ORAL
  Filled 2021-01-25 (×2): qty 1

## 2021-01-25 NOTE — Progress Notes (Signed)
Patient agitated, restless pul;ling at bipap tubing,  attempting to get out of bed, moaning Patient POA  [name] was called twice this shift for update , patient was given 2mg  hadol IM for agitatiom

## 2021-01-25 NOTE — Progress Notes (Signed)
[facility]  ROUNDING NOTE   Subjective:   Ryan Mcclure was admitted to [facility] on 01/12/2021 for Acute CHF (congestive heart failure) ([place]) [I50.9] Acute midline low back pain without sciatica [M54.50] Community acquired pneumonia, unspecified laterality [J18.9] Acute congestive heart failure, unspecified heart failure type ([place]) [I50.9] Sepsis, due to unspecified organism, unspecified whether acute organ dysfunction present ([place]) [A41.9]  Patient seen sitting up in bed Daughter at bedside Appears uncomfortable, restless Denies shortness of breath, 5L [place]  Patient seen later in morning on Bipap   Objective:  Vital signs in last 24 hours:  Temp:  [96.8 F (36 C)-99.2 F (37.3 C)] 96.8 F (36 C) (12/28 0748) Pulse Rate:  [70-113] 70 (12/28 0748) Resp:  [14-25] 17 (12/28 0748) BP: (110-152)/(55-68) 137/68 (12/28 0748) SpO2:  [3 %-100 %] 95 % (12/28 1101) FiO2 (%):  [40 %] 40 % (12/28 0424) Weight:  [93 kg] 93 kg (12/28 0500)  Weight change: 0.1 kg Filed Weights   01/23/21 0500 01/24/21 0148 01/25/21 0500  Weight: 93.4 kg 92.9 kg 93 kg    Intake/Output: I/O last 3 completed shifts: In: 100 [P.O.:100] Out: 900 [Urine:900]   Intake/Output this shift:  Total I/O In: 360 [P.O.:360] Out: -   Physical Exam: General: NAD, laying in bed  Head: Normocephalic, atraumatic. Moist oral mucosal membranes  Eyes: Anicteric  Lungs:  Clear to auscultation, 5L [place] O2  Heart: Regular rate and rhythm  Abdomen:  Soft, nontender  Extremities:  1+ peripheral edema.  Neurologic: Nonfocal, moving all four extremities  Skin: No lesions  Access: none    Basic Metabolic Panel: Recent Labs  Lab 01/21/21 1106 01/22/21 0546 01/23/21 0603 01/24/21 0614 01/25/21 0615  NA 136 140 140 142 142  K 4.3 3.9 4.3 4.5 4.3  CL 103 107 108 106 107  CO2 25 24 24 23 23   GLUCOSE 305* 145* 159* 174* 189*  BUN 57* 62* 74* 85* 92*  CREATININE 3.06* 3.86* 4.53* 4.78* 4.56*   CALCIUM 9.7 9.5 9.6 9.8 9.8  MG 2.4 2.3 2.5* 2.5* 2.7*     Liver Function Tests: Recent Labs  Lab 01/19/21 0636  AST 16  ALT 12  ALKPHOS 59  BILITOT 0.8  PROT 6.5  ALBUMIN 2.7*    No results for input(s): LIPASE, AMYLASE in the last 168 hours. No results for input(s): AMMONIA in the last 168 hours.  CBC: Recent Labs  Lab 01/19/21 0636 01/21/21 1106 01/22/21 0546 01/23/21 0603 01/24/21 0614 01/25/21 0615  WBC 13.2* 13.8* 10.5 12.1* 12.0* 11.5*  NEUTROABS 10.6*  --   --   --   --   --   HGB 8.2* 9.1* 8.0* 8.9* 8.1* 8.5*  HCT 25.7* 29.6* 25.5* 29.1* 26.4* 28.3*  MCV 83.4 85.3 83.9 85.3 85.2 85.2  PLT 119* 155 139* 162 177 191     Cardiac Enzymes: No results for input(s): CKTOTAL, CKMB, CKMBINDEX, TROPONINI in the last 168 hours.  BNP: Invalid input(s): POCBNP  CBG: Recent Labs  Lab 01/24/21 1226 01/24/21 1631 01/24/21 2036 01/25/21 0751 01/25/21 1126  GLUCAP 183* 202* 156* 254* 229*     Microbiology: Results for orders placed or performed during the hospital encounter of 01/24/2021  Resp Panel by RT-PCR (Flu A&B, Covid) Nasopharyngeal Swab     Status: None   Collection Time: 01/01/2021  8:55 PM   Specimen: Nasopharyngeal Swab; Nasopharyngeal(NP) swabs in vial transport medium  Result Value Ref Range Status   SARS Coronavirus 2 by  RT PCR NEGATIVE NEGATIVE Final    Comment: (NOTE) SARS-CoV-2 target nucleic acids are NOT DETECTED.  The SARS-CoV-2 RNA is generally detectable in upper respiratory specimens during the acute phase of infection. The lowest concentration of SARS-CoV-2 viral copies this assay can detect is 138 copies/mL. A negative result does not preclude SARS-Cov-2 infection and should not be used as the sole basis for treatment or other patient management decisions. A negative result may occur with  improper specimen collection/handling, submission of specimen other than nasopharyngeal swab, presence of viral mutation(s) within the areas  targeted by this assay, and inadequate number of viral copies(<138 copies/mL). A negative result must be combined with clinical observations, patient history, and epidemiological information. The expected result is Negative.  Fact Sheet for Patients:  EntrepreneurPulse.com.au  Fact Sheet for Healthcare Providers:  IncredibleEmployment.be  This test is no t yet approved or cleared by the [place] FDA and  has been authorized for detection and/or diagnosis of SARS-CoV-2 by FDA under an Emergency Use Authorization (EUA). This EUA will remain  in effect (meaning this test can be used) for the duration of the COVID-19 declaration under Section 564(b)(1) of the Act, 21 U.S.C.section 360bbb-3(b)(1), unless the authorization is terminated  or revoked sooner.       Influenza A by PCR NEGATIVE NEGATIVE Final   Influenza B by PCR NEGATIVE NEGATIVE Final    Comment: (NOTE) The Xpert Xpress SARS-CoV-2/FLU/RSV plus assay is intended as an aid in the diagnosis of influenza from Nasopharyngeal swab specimens and should not be used as a sole basis for treatment. Nasal washings and aspirates are unacceptable for Xpert Xpress SARS-CoV-2/FLU/RSV testing.  Fact Sheet for Patients: EntrepreneurPulse.com.au  Fact Sheet for Healthcare Providers: IncredibleEmployment.be  This test is not yet approved or cleared by the [place] FDA and has been authorized for detection and/or diagnosis of SARS-CoV-2 by FDA under an Emergency Use Authorization (EUA). This EUA will remain in effect (meaning this test can be used) for the duration of the COVID-19 declaration under Section 564(b)(1) of the Act, 21 U.S.C. section 360bbb-3(b)(1), unless the authorization is terminated or revoked.  Performed at [facility], [address], [place], [place] [address]   Urine Culture     Status: Abnormal   Collection Time:  12/30/2020  9:13 PM   Specimen: In/Out Cath Urine  Result Value Ref Range Status   Specimen Description   Final    IN/OUT CATH URINE Performed at [facility], [address], [place], [place] [address]    Special Requests   Final    NONE Performed at [facility], [address] [place], [place] [address]    Culture MULTIPLE SPECIES PRESENT, SUGGEST RECOLLECTION (A)  Final   Report Status 01/19/2021 FINAL  Final  Blood Culture (routine x 2)     Status: None   Collection Time: 01/06/2021  9:26 PM   Specimen: BLOOD  Result Value Ref Range Status   Specimen Description BLOOD LEFT ANTECUBITAL  Final   Special Requests   Final    BOTTLES DRAWN AEROBIC AND ANAEROBIC Blood Culture results may not be optimal due to an excessive volume of blood received in culture bottles   Culture   Final    NO GROWTH 5 DAYS Performed at [facility], [address], [place], [place] [address]    Report Status 01/22/2021 FINAL  Final  Blood Culture (routine x 2)     Status: None   Collection Time: 01/06/2021  9:26 PM   Specimen: BLOOD  Result Value Ref Range Status   Specimen Description BLOOD BLOOD RIGHT HAND  Final   Special Requests   Final    BOTTLES DRAWN AEROBIC AND ANAEROBIC Blood Culture adequate volume   Culture   Final    NO GROWTH 5 DAYS Performed at [facility], [address], [place], [place] [address]    Report Status 01/22/2021 FINAL  Final  Culture, blood (x 2)     Status: None   Collection Time: 01/18/21  3:24 AM   Specimen: BLOOD  Result Value Ref Range Status   Specimen Description BLOOD LEFT HAND  Final   Special Requests   Final    BOTTLES DRAWN AEROBIC AND ANAEROBIC Blood Culture adequate volume   Culture   Final    NO GROWTH 5 DAYS Performed at [facility], [address] [place], [place] [address]    Report Status 01/23/2021 FINAL  Final  Culture, blood (x 2)     Status: None   Collection Time: 01/18/21  3:24 AM    Specimen: BLOOD  Result Value Ref Range Status   Specimen Description BLOOD RIGHT HAND  Final   Special Requests   Final    BOTTLES DRAWN AEROBIC AND ANAEROBIC Blood Culture results may not be optimal due to an inadequate volume of blood received in culture bottles   Culture   Final    NO GROWTH 5 DAYS Performed at [facility], [address], [place], [place] [address]    Report Status 01/23/2021 FINAL  Final  SARS CORONAVIRUS 2 (TAT 6-24 HRS) Nasopharyngeal Nasopharyngeal Swab     Status: None   Collection Time: 01/20/21  8:00 PM   Specimen: Nasopharyngeal Swab  Result Value Ref Range Status   SARS Coronavirus 2 NEGATIVE NEGATIVE Final    Comment: (NOTE) SARS-CoV-2 target nucleic acids are NOT DETECTED.  The SARS-CoV-2 RNA is generally detectable in upper and lower respiratory specimens during the acute phase of infection. Negative results do not preclude SARS-CoV-2 infection, do not rule out co-infections with other pathogens, and should not be used as the sole basis for treatment or other patient management decisions. Negative results must be combined with clinical observations, patient history, and epidemiological information. The expected result is Negative.  Fact Sheet for Patients: SugarRoll.be  Fact Sheet for Healthcare Providers: https://www.woods-mathews.com/  This test is not yet approved or cleared by the [place] FDA and  has been authorized for detection and/or diagnosis of SARS-CoV-2 by FDA under an Emergency Use Authorization (EUA). This EUA will remain  in effect (meaning this test can be used) for the duration of the COVID-19 declaration under Se ction 564(b)(1) of the Act, 21 U.S.C. section 360bbb-3(b)(1), unless the authorization is terminated or revoked sooner.  Performed at [facility], [place] [address], [place], [place] [address]     Coagulation Studies: No results for input(s):  LABPROT, INR in the last 72 hours.  Urinalysis: No results for input(s): COLORURINE, LABSPEC, PHURINE, GLUCOSEU, HGBUR, BILIRUBINUR, KETONESUR, PROTEINUR, UROBILINOGEN, NITRITE, LEUKOCYTESUR in the last 72 hours.  Invalid input(s): APPERANCEUR    Imaging: DG Chest Port 1 View  Result Date: 01/24/2021 CLINICAL DATA:  Hypoxia. EXAM: PORTABLE CHEST 1 VIEW COMPARISON:  01/01/2021 FINDINGS: Moderate enlargement of the cardiopericardial silhouette with indistinct pulmonary vasculature and hazy bilateral airspace opacities in both lungs along with a few Kerley B lines best seen in the left lung. Obscured left hemidiaphragm due to  airspace opacity. Hazy indistinct opacity along the right hemidiaphragm with partial obscuration. Aortic valve prosthesis. Aortic atherosclerotic calcification. Prior CABG. Thoracic spondylosis. IMPRESSION: 1. Indistinct pulmonary vasculature with Kerley B lines and patchy bilateral airspace opacities favoring acute pulmonary edema. 2. Obscured left hemidiaphragm, compatible with more confluent airspace opacity at the left lung base potentially from atelectasis or pneumonia. A component of left pleural effusion is not excluded. There is a lesser degree of hazy obscuration of the right hemidiaphragm. 3. Thoracic spondylosis. 4.  Aortic Atherosclerosis (ICD10-I70.0). 5. Prior CABG.  Aortic valve prosthesis. Electronically Signed   By: Van Clines M.D.   On: 01/24/2021 11:25     Medications:     atorvastatin  40 mg Oral Daily   clopidogrel  75 mg Oral QHS   diltiazem  30 mg Oral Q8H   ferrous sulfate  325 mg Oral TID WC   furosemide  40 mg Intravenous Q12H   guaiFENesin  600 mg Oral BID   heparin injection (subcutaneous)  5,000 Units Subcutaneous Q8H   insulin aspart  0-9 Units Subcutaneous TID WC   insulin aspart  2 Units Subcutaneous TID WC   insulin glargine-yfgn  24 Units Subcutaneous QHS   metoprolol tartrate  25 mg Oral BID   pantoprazole  40 mg Oral Daily    vitamin B-12  1,000 mcg Oral Daily   acetaminophen **OR** acetaminophen, chlorpheniramine-HYDROcodone, fluticasone, haloperidol lactate, ipratropium-albuterol, magnesium hydroxide, nitroGLYCERIN, ondansetron **OR** ondansetron (ZOFRAN) IV, traZODone  Assessment/ Plan:  Ryan Mcclure is a 87 y.o. Black male with congestive heart failure, aortic valve replacement, diabetes mellitus type II, hypertension, coronary artery disease status post CABG, CVA, hyperlipidemia, AAA, peripheral vascular disease, who is admitted to [facility] on 12/30/2020 for Acute CHF (congestive heart failure) ([place]) [I50.9] Acute midline low back pain without sciatica [M54.50] Community acquired pneumonia, unspecified laterality [J18.9] Acute congestive heart failure, unspecified heart failure type ([place]) [I50.9] Sepsis, due to unspecified organism, unspecified whether acute organ dysfunction present ([place]) [A41.9]  Acute kidney injury on chronic kidney disease stage IV with proteinuria: baseline creatinine of 2.66, GFR of 23, on 07/07/20. No IV contrast exposure. Timing is consistent with overdiuresis leading to ATN. No indication for dialysis at this time. - Continue to hold diuretics.and telmasartan -Creatinine relatively unchanged - Monitoring urine output   Hypertension with chronic kidney disease: 137/68 Well controlled with diltiazem and metoprolol.   Acute exacerbation of systolic congestive heart failure: echocardiogram with EF of 45-50%.  - Given Furosemide 40mg  IV yesterday, will continue this twice daily - appreciate cardiology input.   Anemia with chronic kidney disease: hemoglobin 8.5, normocytic.    Diabetes mellitus type II with chronic kidney disease: insulin dependent. Hemoglobin A1c of 6.4%.  - recommend to not restart metformin on discharge.   - Glucose elevated   LOS: 8   12/28/20222:12 PM

## 2021-01-25 NOTE — Progress Notes (Signed)
Inpatient Diabetes Program Recommendations  AACE/ADA: New Consensus Statement on Inpatient Glycemic Control   Target Ranges:  Prepandial:   less than 140 mg/dL      Peak postprandial:   less than 180 mg/dL (1-2 hours)      Critically ill patients:  140 - 180 mg/dL    Latest Reference Range & Units 01/24/21 07:56 01/24/21 12:26 01/24/21 16:31 01/24/21 20:36 01/25/21 07:51  Glucose-Capillary 70 - 99 mg/dL 156 (H) 183 (H) 202 (H) 156 (H) 254 (H)   Review of Glycemic Control  Current orders for Inpatient glycemic control: Semglee 24 units QHS, Novolog 0-9 units QHS, Novolog 2 units TID with meals  Inpatient Diabetes Program Recommendations:    Insulin: Please consider increasing Semglee to 26 units QHS.  Thanks, Barnie Alderman, RN, MSN, CDE Diabetes Coordinator Inpatient Diabetes Program [phone] (Team Pager from 8am to 5pm)

## 2021-01-25 NOTE — Progress Notes (Signed)
PT Cancellation Note  Patient Details Name: Ryan Mcclure MRN: [id number] DOB: 1931/04/11   Cancelled Treatment:     PT attempt. 2nd attempt this date. Pt still on bi-pap and is resting comfortably. Pt requested P[place] Curia return next date.    Willette Pa 01/25/2021, 3:47 PM

## 2021-01-25 NOTE — Progress Notes (Signed)
[facility] Cardiology  Patient Description: [name] is a 87 year old male with PMH significant for HFpEF, ischemic cardiomyopathy, CAD s/p CABG, s/p AoV replacement (bioprosthetic), paroxsymal atrial fibrillation (not on anticoagulation due to h/o GI bleed), h/o CVA, carotid artery atherosclerosis s/p carotid endarterectomy, diabetes mellitus type 2, dyslipidemia, CKD stage III and obesity who was admitted due to acute hypoxic respiratory failure with pulmonary edema.  Cardiology consulted for acute decompensated systolic heart failure and elevated troponin.  SUBJECTIVE: The patient is feeling reasonably well today.  He continues to complain of dyspnea that remains unchanged. He denies having any chest pain, peripheral edema or palpitations at this time. The patient's family is at the bedside and states that the patient is not at his baseline currently. Furthermore, they report that the patient is mostly independent at baseline at his ALF.   OBJECTIVE:   Vitals:   01/25/21 0424 01/25/21 0500 01/25/21 0740 01/25/21 0748  BP: (!) 110/55   137/68  Pulse: (!) 105   70  Resp: 18   17  Temp: 98.5 F (36.9 C)   (!) 96.8 F (36 C)  TempSrc:      SpO2: 97%  92% 98%  Weight:  93 kg    Height:         Intake/Output Summary (Last 24 hours) at 01/25/2021 0920 Last data filed at 01/25/2021 0500 Gross per 24 hour  Intake 100 ml  Output 500 ml  Net -400 ml      PHYSICAL EXAM  General: Well developed, well nourished, in no acute distress HEENT:  Normocephalic and atraumatic. PERRL Neck:  >4 cm JVD above clavicle  Lungs: Clear bilaterally in upper lobes, diminished breath sounds bilaterally to lower lobes upon auscultation.  Chest expansion symmetrical.  No wheezes, rales, rhonchi or crackles. Heart: HRRR . Normal S1 and S2 without gallops or murmurs.  Abdomen: rounded, distended, Bowel sounds are positive, abdomen soft and non-tender  Msk:  Normal strength and tone for age. Extremities: No  clubbing, cyanosis or edema.   Neuro: Alert and oriented X 3. Psych:  Good affect, responds inappropriately at times.    LABS: Basic Metabolic Panel: Recent Labs    01/24/21 0614 01/25/21 0615  NA 142 142  K 4.5 4.3  CL 106 107  CO2 23 23  GLUCOSE 174* 189*  BUN 85* 92*  CREATININE 4.78* 4.56*  CALCIUM 9.8 9.8  MG 2.5* 2.7*   Liver Function Tests: No results for input(s): AST, ALT, ALKPHOS, BILITOT, PROT, ALBUMIN in the last 72 hours. No results for input(s): LIPASE, AMYLASE in the last 72 hours. CBC: Recent Labs    01/24/21 0614 01/25/21 0615  WBC 12.0* 11.5*  HGB 8.1* 8.5*  HCT 26.4* 28.3*  MCV 85.2 85.2  PLT 177 191   Cardiac Enzymes: No results for input(s): CKTOTAL, CKMB, CKMBINDEX, TROPONINI in the last 72 hours. BNP: Invalid input(s): POCBNP D-Dimer: No results for input(s): DDIMER in the last 72 hours. Hemoglobin A1C: No results for input(s): HGBA1C in the last 72 hours. Fasting Lipid Panel: No results for input(s): CHOL, HDL, LDLCALC, TRIG, CHOLHDL, LDLDIRECT in the last 72 hours. Thyroid Function Tests: No results for input(s): TSH, T4TOTAL, T3FREE, THYROIDAB in the last 72 hours.  Invalid input(s): FREET3 Anemia Panel: Recent Labs    01/24/21 0614  FERRITIN 544*  TIBC 115*  IRON 30*    US RENAL  Result Date: 01/23/2021 CLINICAL DATA:  Acute kidney failure EXAM: RENAL / URINARY TRACT ULTRASOUND COMPLETE COMPARISON:  None.  FINDINGS: Right Kidney: Renal measurements: 10.2 x 5.2 x 6.2 cm = volume: 172 mL. Echogenicity within normal limits. No mass or hydronephrosis visualized. Left Kidney: Renal measurements: 11.7 x 6.1 x 5.5 cm = volume: 204 mL. Echogenicity within normal limits. No mass or hydronephrosis visualized. Bladder: Appears normal for degree of bladder distention. Other: None. IMPRESSION: No acute findings.  No hydronephrosis. Electronically Signed   By: Rolm Baptise M.D.   On: 01/23/2021 12:43   DG Chest Port 1 View  Result Date:  01/24/2021 CLINICAL DATA:  Hypoxia. EXAM: PORTABLE CHEST 1 VIEW COMPARISON:  12/30/2020 FINDINGS: Moderate enlargement of the cardiopericardial silhouette with indistinct pulmonary vasculature and hazy bilateral airspace opacities in both lungs along with a few Kerley B lines best seen in the left lung. Obscured left hemidiaphragm due to airspace opacity. Hazy indistinct opacity along the right hemidiaphragm with partial obscuration. Aortic valve prosthesis. Aortic atherosclerotic calcification. Prior CABG. Thoracic spondylosis. IMPRESSION: 1. Indistinct pulmonary vasculature with Kerley B lines and patchy bilateral airspace opacities favoring acute pulmonary edema. 2. Obscured left hemidiaphragm, compatible with more confluent airspace opacity at the left lung base potentially from atelectasis or pneumonia. A component of left pleural effusion is not excluded. There is a lesser degree of hazy obscuration of the right hemidiaphragm. 3. Thoracic spondylosis. 4.  Aortic Atherosclerosis (ICD10-I70.0). 5. Prior CABG.  Aortic valve prosthesis. Electronically Signed   By: Van Clines M.D.   On: 01/24/2021 11:25     Echo: (01/18/21) IMPRESSIONS   1. Left ventricular ejection fraction, by estimation, is 45 to 50%. The  left ventricle has mildly decreased function. The left ventricle has no  regional wall motion abnormalities. Left ventricular diastolic parameters  were normal.   2. Right ventricular systolic function is normal. The right ventricular  size is normal.   3. Left atrial size was mildly dilated.   4. The mitral valve is normal in structure. Mild mitral valve  regurgitation. No evidence of mitral stenosis.   5. The aortic valve is normal in structure. Aortic valve regurgitation is  not visualized. Aortic valve sclerosis is present, with no evidence of  aortic valve stenosis.   6. The inferior vena cava is normal in size with greater than 50%  respiratory variability, suggesting right  atrial pressure of 3 mmHg.   TELEMETRY: Atrial fibrillation with frequent unifocal PVCs and a heart rate of 94 bpm.  Occasional brief episodes of nonsustained ventricular tachycardia noted upon telemetry review over the last 24 hours.  ASSESSMENT AND PLAN:  Principal Problem:   Acute CHF (congestive heart failure) (HCC)    # Acute systolic CHF #Ischemic cardiomyopathy # s/p AoV replacement (bioprosthetic) Echocardiogram from 12/21 revealed low normal LV systolic function with estimated EF of 45-50%, mildly dilated LA, mild mitral valve regurgitation and aortic valve sclerosis.  The patient appears hypervolemic at this time.  Diuretics initially held due to worsening creatinine; however, nephrology was consulted and cardiorenal syndrome is suspected.   - consider restarting low dose diuretic therapy; however, we will await Nephrology's final recommendations for today.   -ARB held due to AKI on CKD.  -Continue beta-blocker.  -Strict I's and O's, daily weights, low-sodium diet.   # Elevated troponin  # CAD # s/p CABG  Initial troponin elevated at 45>> 262>>237 likely secondary to demand ischemia.  Echocardiogram as above.  The patient is without chest pain at this time.  -Continue beta-blocker as above.  -Continue statin and Plavix.  -Continue nitroglycerin as needed for chest  pain.  -Consider nuclear stress test as outpatient.  # Atrial fibrillation  # NSVT  The patient continues to be in atrial fibrillation with frequent PVCs on the telemetry monitor.  Upon telemetry monitor review the patient continues to have brief episodes of NSVT.  Patient has a CHA2DS2-VASc score of 6 but is a poor anticoagulant candidate due to history of GI bleed.  -Continue beta-blocker as above.  -Continue diltiazem as needed for rapid ventricular response.  -Continuous cardiac telemetry monitoring until discharge.  #Acute hypoxemic respiratory failure likely secondary to CAP #Acute hypercapnic  respiratory failure The patient continues to require supplemental oxygen via nasal cannula at 5L and he continues to have symptoms of dyspnea.  -Agree with current management.  # HTN Blood pressure appears to be well controlled at this time.  -Continue antihypertensives as above.  # DM2  -Agree with current management.  #AKI on CKD  -Recommendations as above.     [place], [id number]-AG  01/25/2021 9:20 AM

## 2021-01-25 NOTE — Progress Notes (Addendum)
PROGRESS NOTE    THORSTEN CLIMER  URK:[id number] DOB: 1932/01/13 DOA: 01/18/2021 PCP: [place] Pink, MD   Brief Narrative: Ryan Mcclure is a 87 y.o. male with a history of reduced EF heart failure, CKD stage IV, diabetes mellitus type 2, CVA with, AVR with bioprosthetic valve, CAD status post CABG/stents, GI bleeding.  Patient presented secondary to lower back pain and cough and found to have hypoxia on admission with evidence of vascular congestion.  Patient with associated elevated troponin.  Patient started heart failure with resultant worsening kidney.  Nephrology consult with decision patient still requiring oxygen supplementation.   Assessment & Plan:   Principal Problem:   Acute CHF (congestive heart failure) (HCC)   Acute respiratory failure with hypoxia Acute pulmonary edema Secondary to acute systolic heart failure.  Patient is now on oxygen as an outpatient approximately 6 L with via nasal cannula and BiPAP as well.  Chest x-ray from 12/27 with persistent pulmonary edema plus or minus -Continue oxygen as able with diuresis  Acute respiratory failure with hypercarbia Recent ABG with a pH of 7.28 and PCO2 of 54.  Started on BiPAP. -Discontinue continuous BiPAP -BiPAP nightly  Acute systolic heart failure Echo obtained on 12/22 significant for an EF of 45 to 50%.  Patient was diuresed with Lasix IV which was held secondary to worsening creatinine.  Nephrology consulted and have resumed diuresis. -Nephrology recommendations: Lasix 40 mg IV twice daily  AKI on CKD stage IV Baseline creatinine of about 2.66.  Concern for possible ATN from overdiuresis.  Diuretics initially held but now has been restarted secondary to heart failure.  Telmisartan held.  Creatinine peaked 4.78 and appears to be trending down slightly.  Primary hypertension -Continue diltiazem 30 mg 3 times daily and metoprolol 25 mg twice daily  Chronic anemia Likely secondary to CKD.  Baseline hemoglobin  varies from 8-11 hemoglobin stable.  Diabetes mellitus, type II Uncontrolled with hyperglycemia.  Patient is on metformin as an outpatient which is recommended to be discontinued secondary to CKD on discharge.  Patient is also on insulin as an outpatient. -Continue Semglee 24 units, NovoLog 2 units 3 times daily with meals and sliding scale insulin  Community-acquired pneumonia Patient treated empirically with ceftriaxone and azithromycin and transition to Omnicef to complete a total of 7 days of antibiotics.  SVT PVCs -Continue metoprolol/diltiazem  Right upper lobe lung nodule Noted incidentally on imaging.  1.2 cm in size.  Recommendation for repeat CT in 3 to 6 months   DVT prophylaxis: Heparin subcutaneous Code Status:   Code Status: Full Code Family Communication: None at bedside.  Called sister with no answer. Disposition Plan: Discharge to SNF pending ability to wean oxygen down and improvement of acute heart failure/AKI   Consultants:  Cardiology Nephrology  Procedures:  TRANSTHORACIC ECHOCARDIOGRAM (01/18/2021) IMPRESSIONS     1. Left ventricular ejection fraction, by estimation, is 45 to 50%. The  left ventricle has mildly decreased function. The left ventricle has no  regional wall motion abnormalities. Left ventricular diastolic parameters  were normal.   2. Right ventricular systolic function is normal. The right ventricular  size is normal.   3. Left atrial size was mildly dilated.   4. The mitral valve is normal in structure. Mild mitral valve  regurgitation. No evidence of mitral stenosis.   5. The aortic valve is normal in structure. Aortic valve regurgitation is  not visualized. Aortic valve sclerosis is present, with no evidence of  aortic valve stenosis.  6. The inferior vena cava is normal in size with greater than 50%  respiratory variability, suggesting right atrial pressure of 3 mmHg.   Antimicrobials: Ceftriaxone Cefdinir p.o. Azithromycin  IV   Subjective: Patient reports some dyspnea.  No chest pain  Objective: Vitals:   01/25/21 1602 01/25/21 1623 01/25/21 1800 01/25/21 1956  BP:  (!) 101/35 (!) 126/52 (!) 136/50  Pulse:  (!) 110  93  Resp:    18  Temp:  (!) 97.3 F (36.3 C)  (!) 97.4 F (36.3 C)  TempSrc:      SpO2: 95% 96%  98%  Weight:      Height:        Intake/Output Summary (Last 24 hours) at 01/25/2021 2003 Last data filed at 01/25/2021 1811 Gross per 24 hour  Intake 580 ml  Output 1100 ml  Net -520 ml   Filed Weights   01/23/21 0500 01/24/21 0148 01/25/21 0500  Weight: 93.4 kg 92.9 kg 93 kg    Examination:  General exam: Appears calm and comfortable.  On BiPAP. Respiratory system: Diminished. Respiratory effort normal. Cardiovascular system: S1 & S2 heard, RRR. No murmurs, rubs, gallops or clicks. Gastrointestinal system: Abdomen is nondistended, soft and nontender. No organomegaly or masses felt. Normal bowel sounds heard. Central nervous system: Alert and oriented to person and place.  Thought year was 2023. No focal neurological deficits. Musculoskeletal: No edema. No calf tenderness Skin: No cyanosis. No rashes Psychiatry: Judgement and insight appear normal. Mood & affect appropriate.     Data Reviewed: I have personally reviewed following labs and imaging studies  CBC Lab Results  Component Value Date   WBC 11.5 (H) 01/25/2021   RBC 3.32 (L) 01/25/2021   HGB 8.5 (L) 01/25/2021   HCT 28.3 (L) 01/25/2021   MCV 85.2 01/25/2021   MCH 25.6 (L) 01/25/2021   PLT 191 01/25/2021   MCHC 30.0 01/25/2021   RDW 14.6 01/25/2021   LYMPHSABS 0.9 01/19/2021   MONOABS 1.5 (H) 01/19/2021   EOSABS 0.1 01/19/2021   BASOSABS 0.0 15/05/6977     Last metabolic panel Lab Results  Component Value Date   NA 142 01/25/2021   K 4.3 01/25/2021   CL 107 01/25/2021   CO2 23 01/25/2021   BUN 92 (H) 01/25/2021   CREATININE 4.56 (H) 01/25/2021   GLUCOSE 189 (H) 01/25/2021   GFRNONAA 12 (L)  01/25/2021   GFRAA 46 (L) 08/16/2017   CALCIUM 9.8 01/25/2021   PHOS 3.9 02/21/2011   PROT 6.5 01/19/2021   ALBUMIN 2.7 (L) 01/19/2021   BILITOT 0.8 01/19/2021   ALKPHOS 59 01/19/2021   AST 16 01/19/2021   ALT 12 01/19/2021   ANIONGAP 12 01/25/2021    CBG (last 3)  Recent Labs    01/25/21 0751 01/25/21 1126 01/25/21 1626  GLUCAP 254* 229* 234*     GFR: Estimated Creatinine Clearance: 12.4 mL/min (A) (by C-G formula based on SCr of 4.56 mg/dL (H)).  Coagulation Profile: No results for input(s): INR, PROTIME in the last 168 hours.  Recent Results (from the past 240 hour(s))  Resp Panel by RT-PCR (Flu A&B, Covid) Nasopharyngeal Swab     Status: None   Collection Time: 01/26/2021  8:55 PM   Specimen: Nasopharyngeal Swab; Nasopharyngeal(NP) swabs in vial transport medium  Result Value Ref Range Status   SARS Coronavirus 2 by RT PCR NEGATIVE NEGATIVE Final    Comment: (NOTE) SARS-CoV-2 target nucleic acids are NOT DETECTED.  The SARS-CoV-2 RNA is generally detectable  in upper respiratory specimens during the acute phase of infection. The lowest concentration of SARS-CoV-2 viral copies this assay can detect is 138 copies/mL. A negative result does not preclude SARS-Cov-2 infection and should not be used as the sole basis for treatment or other patient management decisions. A negative result may occur with  improper specimen collection/handling, submission of specimen other than nasopharyngeal swab, presence of viral mutation(s) within the areas targeted by this assay, and inadequate number of viral copies(<138 copies/mL). A negative result must be combined with clinical observations, patient history, and epidemiological information. The expected result is Negative.  Fact Sheet for Patients:  EntrepreneurPulse.com.au  Fact Sheet for Healthcare Providers:  IncredibleEmployment.be  This test is no t yet approved or cleared by the [place] FDA and  has been authorized for detection and/or diagnosis of SARS-CoV-2 by FDA under an Emergency Use Authorization (EUA). This EUA will remain  in effect (meaning this test can be used) for the duration of the COVID-19 declaration under Section 564(b)(1) of the Act, 21 U.S.C.section 360bbb-3(b)(1), unless the authorization is terminated  or revoked sooner.       Influenza A by PCR NEGATIVE NEGATIVE Final   Influenza B by PCR NEGATIVE NEGATIVE Final    Comment: (NOTE) The Xpert Xpress SARS-CoV-2/FLU/RSV plus assay is intended as an aid in the diagnosis of influenza from Nasopharyngeal swab specimens and should not be used as a sole basis for treatment. Nasal washings and aspirates are unacceptable for Xpert Xpress SARS-CoV-2/FLU/RSV testing.  Fact Sheet for Patients: EntrepreneurPulse.com.au  Fact Sheet for Healthcare Providers: IncredibleEmployment.be  This test is not yet approved or cleared by the [place] FDA and has been authorized for detection and/or diagnosis of SARS-CoV-2 by FDA under an Emergency Use Authorization (EUA). This EUA will remain in effect (meaning this test can be used) for the duration of the COVID-19 declaration under Section 564(b)(1) of the Act, 21 U.S.C. section 360bbb-3(b)(1), unless the authorization is terminated or revoked.  Performed at [facility], [address] [place], [place] [address]   Urine Culture     Status: Abnormal   Collection Time: 01/05/2021  9:13 PM   Specimen: In/Out Cath Urine  Result Value Ref Range Status   Specimen Description   Final    IN/OUT CATH URINE Performed at [facility], [address], [place], [place] [address]    Special Requests   Final    NONE Performed at [facility], [address] [place], [place] [address]    Culture MULTIPLE SPECIES PRESENT, SUGGEST RECOLLECTION (A)  Final   Report Status 01/19/2021 FINAL  Final   Blood Culture (routine x 2)     Status: None   Collection Time: 01/11/2021  9:26 PM   Specimen: BLOOD  Result Value Ref Range Status   Specimen Description BLOOD LEFT ANTECUBITAL  Final   Special Requests   Final    BOTTLES DRAWN AEROBIC AND ANAEROBIC Blood Culture results may not be optimal due to an excessive volume of blood received in culture bottles   Culture   Final    NO GROWTH 5 DAYS Performed at [facility], [address] [place],  [address]    Report Status 01/22/2021 FINAL  Final  Blood Culture (routine x 2)     Status: None   Collection Time: 01/23/2021  9:26 PM   Specimen: BLOOD  Result Value Ref Range Status   Specimen Description BLOOD BLOOD RIGHT HAND  Final  Special Requests   Final    BOTTLES DRAWN AEROBIC AND ANAEROBIC Blood Culture adequate volume   Culture   Final    NO GROWTH 5 DAYS Performed at [facility], [address] [place], [place] [address]    Report Status 01/22/2021 FINAL  Final  Culture, blood (x 2)     Status: None   Collection Time: 01/18/21  3:24 AM   Specimen: BLOOD  Result Value Ref Range Status   Specimen Description BLOOD LEFT HAND  Final   Special Requests   Final    BOTTLES DRAWN AEROBIC AND ANAEROBIC Blood Culture adequate volume   Culture   Final    NO GROWTH 5 DAYS Performed at [facility], [address], [place], [place] [address]    Report Status 01/23/2021 FINAL  Final  Culture, blood (x 2)     Status: None   Collection Time: 01/18/21  3:24 AM   Specimen: BLOOD  Result Value Ref Range Status   Specimen Description BLOOD RIGHT HAND  Final   Special Requests   Final    BOTTLES DRAWN AEROBIC AND ANAEROBIC Blood Culture results may not be optimal due to an inadequate volume of blood received in culture bottles   Culture   Final    NO GROWTH 5 DAYS Performed at [facility], [address] [place], [place] [address]    Report Status 01/23/2021 FINAL  Final  SARS  CORONAVIRUS 2 (TAT 6-24 HRS) Nasopharyngeal Nasopharyngeal Swab     Status: None   Collection Time: 01/20/21  8:00 PM   Specimen: Nasopharyngeal Swab  Result Value Ref Range Status   SARS Coronavirus 2 NEGATIVE NEGATIVE Final    Comment: (NOTE) SARS-CoV-2 target nucleic acids are NOT DETECTED.  The SARS-CoV-2 RNA is generally detectable in upper and lower respiratory specimens during the acute phase of infection. Negative results do not preclude SARS-CoV-2 infection, do not rule out co-infections with other pathogens, and should not be used as the sole basis for treatment or other patient management decisions. Negative results must be combined with clinical observations, patient history, and epidemiological information. The expected result is Negative.  Fact Sheet for Patients: SugarRoll.be  Fact Sheet for Healthcare Providers: https://www.woods-mathews.com/  This test is not yet approved or cleared by the [place] FDA and  has been authorized for detection and/or diagnosis of SARS-CoV-2 by FDA under an Emergency Use Authorization (EUA). This EUA will remain  in effect (meaning this test can be used) for the duration of the COVID-19 declaration under Se ction 564(b)(1) of the Act, 21 U.S.C. section 360bbb-3(b)(1), unless the authorization is terminated or revoked sooner.  Performed at [facility], [place] [address] [place], [place] [address]         Radiology Studies: DG Chest Port 1 View  Result Date: 01/24/2021 CLINICAL DATA:  Hypoxia. EXAM: PORTABLE CHEST 1 VIEW COMPARISON:  01/06/2021 FINDINGS: Moderate enlargement of the cardiopericardial silhouette with indistinct pulmonary vasculature and hazy bilateral airspace opacities in both lungs along with a few Kerley B lines best seen in the left lung. Obscured left hemidiaphragm due to airspace opacity. Hazy indistinct opacity along the right hemidiaphragm with partial  obscuration. Aortic valve prosthesis. Aortic atherosclerotic calcification. Prior CABG. Thoracic spondylosis. IMPRESSION: 1. Indistinct pulmonary vasculature with Kerley B lines and patchy bilateral airspace opacities favoring acute pulmonary edema. 2. Obscured left hemidiaphragm, compatible with more confluent airspace opacity at the left lung base potentially from atelectasis or pneumonia. A  component of left pleural effusion is not excluded. There is a lesser degree of hazy obscuration of the right hemidiaphragm. 3. Thoracic spondylosis. 4.  Aortic Atherosclerosis (ICD10-I70.0). 5. Prior CABG.  Aortic valve prosthesis. Electronically Signed   By: Van Clines M.D.   On: 01/24/2021 11:25        Scheduled Meds:  atorvastatin  40 mg Oral Daily   clopidogrel  75 mg Oral QHS   diltiazem  30 mg Oral Q8H   docusate sodium  100 mg Oral BID   ferrous sulfate  325 mg Oral TID WC   furosemide  40 mg Intravenous Q12H   guaiFENesin  600 mg Oral BID   heparin injection (subcutaneous)  5,000 Units Subcutaneous Q8H   insulin aspart  0-9 Units Subcutaneous TID WC   insulin aspart  2 Units Subcutaneous TID WC   insulin glargine-yfgn  24 Units Subcutaneous QHS   metoprolol tartrate  25 mg Oral BID   pantoprazole  40 mg Oral Daily   polyethylene glycol  17 g Oral Daily   vitamin B-12  1,000 mcg Oral Daily   Continuous Infusions:   LOS: 8 days     Cordelia Poche, MD [facility] 01/25/2021, 8:03 PM  If 7PM-7AM, please contact night-coverage [email or web address]

## 2021-01-25 NOTE — Progress Notes (Signed)
PT Cancellation Note  Patient Details Name: Ryan Mcclure MRN: [id number] DOB: March 13, 1931   Cancelled Treatment:     PT attempt. Pt was being placed back on bi-pap  upon arriving due to c/o difficulty breathing. Resting HR in 130s. Will return later if pt is more appropriate to participate. Will request MD clearance to participate.    Willette Pa 01/25/2021, 11:43 AM

## 2021-01-26 ENCOUNTER — Inpatient Hospital Stay: Payer: Medicare Other

## 2021-01-26 DIAGNOSIS — R0902 Hypoxemia: Secondary | ICD-10-CM

## 2021-01-26 DIAGNOSIS — J189 Pneumonia, unspecified organism: Secondary | ICD-10-CM | POA: Diagnosis not present

## 2021-01-26 DIAGNOSIS — I509 Heart failure, unspecified: Secondary | ICD-10-CM | POA: Diagnosis not present

## 2021-01-26 DIAGNOSIS — Z515 Encounter for palliative care: Secondary | ICD-10-CM

## 2021-01-26 DIAGNOSIS — J9601 Acute respiratory failure with hypoxia: Secondary | ICD-10-CM

## 2021-01-26 DIAGNOSIS — I5021 Acute systolic (congestive) heart failure: Secondary | ICD-10-CM | POA: Diagnosis not present

## 2021-01-26 DIAGNOSIS — J810 Acute pulmonary edema: Secondary | ICD-10-CM | POA: Diagnosis not present

## 2021-01-26 DIAGNOSIS — Z66 Do not resuscitate: Secondary | ICD-10-CM

## 2021-01-26 LAB — RENAL FUNCTION PANEL
Albumin: 2.9 g/dL — ABNORMAL LOW (ref 3.5–5.0)
Anion gap: 11 (ref 5–15)
BUN: 94 mg/dL — ABNORMAL HIGH (ref 8–23)
CO2: 24 mmol/L (ref 22–32)
Calcium: 9.7 mg/dL (ref 8.9–10.3)
Chloride: 105 mmol/L (ref 98–111)
Creatinine, Ser: 4.37 mg/dL — ABNORMAL HIGH (ref 0.61–1.24)
GFR, Estimated: 12 mL/min — ABNORMAL LOW (ref >60)
Glucose, Bld: 240 mg/dL — ABNORMAL HIGH (ref 70–99)
Phosphorus: 4.4 mg/dL (ref 2.5–4.6)
Potassium: 4.6 mmol/L (ref 3.5–5.1)
Sodium: 140 mmol/L (ref 135–145)

## 2021-01-26 LAB — GLUCOSE, CAPILLARY
Glucose-Capillary: 231 mg/dL — ABNORMAL HIGH (ref 70–99)
Glucose-Capillary: 273 mg/dL — ABNORMAL HIGH (ref 70–99)
Glucose-Capillary: 248 mg/dL — ABNORMAL HIGH (ref 70–99)

## 2021-01-26 LAB — BLOOD GAS, ARTERIAL
Acid-base deficit: 0.1 mmol/L (ref 0.0–2.0)
Bicarbonate: 27.3 mmol/L (ref 20.0–28.0)
Delivery systems: POSITIVE
Expiratory PAP: 6
FIO2: 0.6
Inspiratory PAP: 12
O2 Saturation: 80.5 %
Patient temperature: 37
pCO2 arterial: 58 mmHg — ABNORMAL HIGH (ref 32.0–48.0)
pH, Arterial: 7.28 — ABNORMAL LOW (ref 7.350–7.450)
pO2, Arterial: 51 mmHg — ABNORMAL LOW (ref 83.0–108.0)

## 2021-01-26 MED ORDER — HALOPERIDOL 0.5 MG PO TABS
0.5000 mg | ORAL_TABLET | ORAL | Status: DC | PRN
  Filled 2021-01-26: qty 1

## 2021-01-26 MED ORDER — ONDANSETRON HCL 4 MG/2ML IJ SOLN: 4.0000 mg | Freq: Four times a day (QID) | INTRAMUSCULAR | Status: DC | PRN

## 2021-01-26 MED ORDER — INSULIN GLARGINE-YFGN 100 UNIT/ML ~~LOC~~ SOLN
30.0000 [IU] | Freq: Every day | SUBCUTANEOUS | Status: DC
  Filled 2021-01-26: qty 0.3

## 2021-01-26 MED ORDER — LORAZEPAM 1 MG PO TABS: 1.0000 mg | ORAL_TABLET | ORAL | Status: DC | PRN

## 2021-01-26 MED ORDER — ONDANSETRON 4 MG PO TBDP
4.0000 mg | ORAL_TABLET | Freq: Four times a day (QID) | ORAL | Status: DC | PRN
  Filled 2021-01-26: qty 1

## 2021-01-26 MED ORDER — CHLORHEXIDINE GLUCONATE CLOTH 2 % EX PADS
6.0000 | MEDICATED_PAD | Freq: Every day | CUTANEOUS | Status: DC
  Administered 2021-01-26: 13:00:00 6 via TOPICAL

## 2021-01-26 MED ORDER — HALOPERIDOL LACTATE 5 MG/ML IJ SOLN: 0.5000 mg | INTRAMUSCULAR | Status: DC | PRN

## 2021-01-26 MED ORDER — SPIRONOLACTONE 25 MG PO TABS: 25.0000 mg | ORAL_TABLET | Freq: Every day | ORAL | Status: DC

## 2021-01-26 MED ORDER — MORPHINE 100MG IN NS 100ML (1MG/ML) PREMIX INFUSION
1.0000 mg/h | INTRAVENOUS | Status: DC
  Administered 2021-01-26: 15:00:00 2 mg/h via INTRAVENOUS
  Filled 2021-01-26: qty 100

## 2021-01-26 MED ORDER — POLYVINYL ALCOHOL 1.4 % OP SOLN
1.0000 [drp] | Freq: Four times a day (QID) | OPHTHALMIC | Status: DC | PRN
  Filled 2021-01-26: qty 15

## 2021-01-26 MED ORDER — GLYCOPYRROLATE 0.2 MG/ML IJ SOLN: 0.2000 mg | INTRAMUSCULAR | Status: DC | PRN

## 2021-01-26 MED ORDER — HALOPERIDOL LACTATE 2 MG/ML PO CONC
0.5000 mg | ORAL | Status: DC | PRN
  Filled 2021-01-26: qty 0.3

## 2021-01-26 MED ORDER — ACETAMINOPHEN 325 MG PO TABS: 650.0000 mg | ORAL_TABLET | Freq: Four times a day (QID) | ORAL | Status: DC | PRN

## 2021-01-26 MED ORDER — MORPHINE BOLUS VIA INFUSION
2.0000 mg | INTRAVENOUS | Status: DC | PRN
  Filled 2021-01-26: qty 2

## 2021-01-26 MED ORDER — ACETAMINOPHEN 650 MG RE SUPP: 650.0000 mg | Freq: Four times a day (QID) | RECTAL | Status: DC | PRN

## 2021-01-26 MED ORDER — LORAZEPAM 2 MG/ML PO CONC: 1.0000 mg | ORAL | Status: DC | PRN

## 2021-01-26 MED ORDER — LORAZEPAM 2 MG/ML IJ SOLN: 1.0000 mg | INTRAMUSCULAR | Status: DC | PRN

## 2021-01-26 MED ORDER — BIOTENE DRY MOUTH MT LIQD: 15.0000 mL | OROMUCOSAL | Status: DC | PRN

## 2021-01-26 MED ORDER — DIPHENHYDRAMINE HCL 50 MG/ML IJ SOLN: 12.5000 mg | INTRAMUSCULAR | Status: DC | PRN

## 2021-01-26 MED ORDER — GLYCOPYRROLATE 1 MG PO TABS
1.0000 mg | ORAL_TABLET | ORAL | Status: DC | PRN
  Filled 2021-01-26: qty 1

## 2021-01-26 NOTE — Progress Notes (Signed)
Patient transferred to ICU on BIPAP via bed with RT and RN.  Bedside report given to Judson Roch, RN.

## 2021-01-26 NOTE — Consult Note (Signed)
Consultation Note Date: 01/26/2021   Patient Name: Ryan Mcclure  DOB: 08/22/1931  MRN: [id number]  Age / Sex: 87 y.o., male  PCP: Ryan Pink, MD Referring Physician: Mariel Aloe, MD  Reason for Consultation: End of life care  HPI/Patient Profile: 87 y.o. male  with past medical history of AVR with bioprosthetic valve, CVA, diabetes type 2, CKD (stage III), heart failure with reduced ejection fraction, GI bleed, and CAD status post CABG admitted on 01/12/2021 with acute hypoxic and hypercapnic respiratory failure and community-acquired pneumonia.   Palliative medicine team was consulted to manage end-of-life and comfort care measures.  Clinical Assessment and Goals of Care: After reviewing the patient's chart, epic notes, labs, and imaging, I received a secure chat from Dr. Merrilee Mcclure at 1pm.  He shared that family would like to move forward with comfort measures and if patient is stable he can be transferred to hospice care.    Received secure chat from NP Ryan Mcclure at 1420 that family would like to move forward with comfort measures, monitor pt overnight to see if he stabilizes, and if so they would like for him to be considered for hospice placement.  I assessed the patient at bedside. No family present.  Patient appears to be in mild distress.  He is a mouth breather so nasal cannula is placed in his mouth.  He is moaning mildly and states with a low voice "oh, Ryan Mcclure, oh Lord".  No brow furrowing, increased work of breathing or wincing in pain noted.  I met with NP Ryan Mcclure and Psychologist, clinical.  Discussed that patient has been moved to full comfort measures.  Though hospital death is anticipated, if patient is alive and medically appropriate hospice will be contacted for possible hospice home placement.   Unrestricted visitor access, comfort measures, and discontinuation of noncomfort focused medications made in  [facility].  Attending Dr. Lonny Mcclure made aware that palliative medicine team will sign off for now but will remain available to medical team and patient if needed.   Primary Decision Maker HCPOA  Code Status/Advance Care Planning: DNR Unrestricted visitor access RN may pronounce death Comfort measures I personally discontinued all 9 comfort focused medications from [facility] RN made aware to ween oxygen to room air and NOT reapply supplemental oxygen  Prognosis:   Hours - Days  Discharge Planning: Anticipated Hospital Death  Primary Diagnoses: Present on Admission: **None**   Physical Exam Constitutional:      General: He is in acute distress.     Appearance: He is ill-appearing.  HENT:     Head: Normocephalic and atraumatic.     Nose: Nose normal.     Mouth/Throat:     Mouth: Mucous membranes are moist.  Cardiovascular:     Pulses: Normal pulses.  Pulmonary:     Effort: Pulmonary effort is normal.  Abdominal:     Palpations: Abdomen is soft.  Musculoskeletal:     Comments: Generalized weakness  Skin:    General: Skin is warm and dry.  Neurological:  Comments: Nonverbal except for moaning    Vital Signs: BP (!) 150/82    Pulse (!) 25    Temp (!) 94.4 F (34.7 C) (Axillary)    Resp (!) 26    Ht 5' 7"  (1.702 m)    Wt 93.6 kg    SpO2 92%    BMI 32.32 kg/m  Pain Scale: 0-10   Pain Score: 0-No pain SpO2: SpO2: 92 % O2 Device:SpO2: 92 % O2 Flow Rate: .O2 Flow Rate (L/min): 10 L/min  Palliative Assessment/Data: 10%     I discussed this patient's plan of care with Dr. Merrilee Jansky, NP Ryan Mcclure, Dr. Lonny Mcclure, Ryan Mcclure.  Thank you for this consult. Palliative medicine will continue to follow and assist holistically.   Time Total: 30 minutes Greater than 50%  of this time was spent counseling and coordinating care related to the above assessment and plan.  Signed by: Ryan Hawks, DNP, FNP-BC Palliative Medicine    Please contact Palliative Medicine Team phone at  [phone] for questions and concerns.  For individual provider: See Ryan Mcclure

## 2021-01-26 NOTE — Progress Notes (Signed)
[facility]  ROUNDING NOTE   Subjective:   [name] was admitted to [facility] on 01/18/2021 for Acute CHF (congestive heart failure) ([place]) [I50.9] Acute midline low back pain without sciatica [M54.50] Community acquired pneumonia, unspecified laterality [J18.9] Acute congestive heart failure, unspecified heart failure type ([place]) [I50.9] Sepsis, due to unspecified organism, unspecified whether acute organ dysfunction present [facility]) [A41.9]  Patient seen sitting up in bed, restless Alert and oriented Currently on 10 L high flow oxygen Complains of generalized pain  Renal function remains at 12% Urine output of 1.1 L in past 24 hours   Objective:  Vital signs in last 24 hours:  Temp:  [94.4 F (34.7 C)-98.2 F (36.8 C)] 94.4 F (34.7 C) (12/29 1219) Pulse Rate:  [50-110] 50 (12/29 1300) Resp:  [14-28] 19 (12/29 1300) BP: (101-162)/(35-62) 129/47 (12/29 1300) SpO2:  [84 %-100 %] 95 % (12/29 1300) FiO2 (%):  [60 %-100 %] 100 % (12/29 1219) Weight:  [93.6 kg] 93.6 kg (12/29 0500)  Weight change: 0.6 kg Filed Weights   01/24/21 0148 01/25/21 0500 01/26/21 0500  Weight: 92.9 kg 93 kg 93.6 kg    Intake/Output: I/O last 3 completed shifts: In: 46 [P.O.:580] Out: 1600 [Urine:1600]   Intake/Output this shift:  Total I/O In: 240 [P.O.:240] Out: -   Physical Exam: General: NAD, laying in bed  Head: Normocephalic, atraumatic. Moist oral mucosal membranes  Eyes: Anicteric  Lungs:  Basilar crackles, 10 L high flow oxygen  Heart: Regular rate and rhythm  Abdomen:  Soft, nontender  Extremities:  1+ peripheral edema.  Neurologic: Nonfocal, moving all four extremities  Skin: No lesions  Access: none    Basic Metabolic Panel: Recent Labs  Lab 01/21/21 1106 01/22/21 0546 01/23/21 0603 01/24/21 0614 01/25/21 0615 01/26/21 0627  NA 136 140 140 142 142 140  K 4.3 3.9 4.3 4.5 4.3 4.6  CL 103 107 108 106 107 105  CO2 25 24 24 23 23 24   GLUCOSE 305*  145* 159* 174* 189* 240*  BUN 57* 62* 74* 85* 92* 94*  CREATININE 3.06* 3.86* 4.53* 4.78* 4.56* 4.37*  CALCIUM 9.7 9.5 9.6 9.8 9.8 9.7  MG 2.4 2.3 2.5* 2.5* 2.7*  --   PHOS  --   --   --   --   --  4.4     Liver Function Tests: Recent Labs  Lab 01/26/21 0627  ALBUMIN 2.9*    No results for input(s): LIPASE, AMYLASE in the last 168 hours. No results for input(s): AMMONIA in the last 168 hours.  CBC: Recent Labs  Lab 01/21/21 1106 01/22/21 0546 01/23/21 0603 01/24/21 0614 01/25/21 0615  WBC 13.8* 10.5 12.1* 12.0* 11.5*  HGB 9.1* 8.0* 8.9* 8.1* 8.5*  HCT 29.6* 25.5* 29.1* 26.4* 28.3*  MCV 85.3 83.9 85.3 85.2 85.2  PLT 155 139* 162 177 191     Cardiac Enzymes: No results for input(s): CKTOTAL, CKMB, CKMBINDEX, TROPONINI in the last 168 hours.  BNP: Invalid input(s): POCBNP  CBG: Recent Labs  Lab 01/25/21 1626 01/25/21 2015 01/26/21 0813 01/26/21 1128 01/26/21 1218  GLUCAP 234* 216* 231* 273* 248*     Microbiology: Results for orders placed or performed during the hospital encounter of 01/06/2021  Resp Panel by RT-PCR (Flu A&B, Covid) Nasopharyngeal Swab     Status: None   Collection Time: 01/04/2021  8:55 PM   Specimen: Nasopharyngeal Swab; Nasopharyngeal(NP) swabs in vial transport medium  Result Value Ref Range Status   SARS  Coronavirus 2 by RT PCR NEGATIVE NEGATIVE Final    Comment: (NOTE) SARS-CoV-2 target nucleic acids are NOT DETECTED.  The SARS-CoV-2 RNA is generally detectable in upper respiratory specimens during the acute phase of infection. The lowest concentration of SARS-CoV-2 viral copies this assay can detect is 138 copies/mL. A negative result does not preclude SARS-Cov-2 infection and should not be used as the sole basis for treatment or other patient management decisions. A negative result may occur with  improper specimen collection/handling, submission of specimen other than nasopharyngeal swab, presence of viral mutation(s) within  the areas targeted by this assay, and inadequate number of viral copies(<138 copies/mL). A negative result must be combined with clinical observations, patient history, and epidemiological information. The expected result is Negative.  Fact Sheet for Patients:  EntrepreneurPulse.com.au  Fact Sheet for Healthcare Providers:  IncredibleEmployment.be  This test is no t yet approved or cleared by the [place] FDA and  has been authorized for detection and/or diagnosis of SARS-CoV-2 by FDA under an Emergency Use Authorization (EUA). This EUA will remain  in effect (meaning this test can be used) for the duration of the COVID-19 declaration under Section 564(b)(1) of the Act, 21 U.S.C.section 360bbb-3(b)(1), unless the authorization is terminated  or revoked sooner.       Influenza A by PCR NEGATIVE NEGATIVE Final   Influenza B by PCR NEGATIVE NEGATIVE Final    Comment: (NOTE) The Xpert Xpress SARS-CoV-2/FLU/RSV plus assay is intended as an aid in the diagnosis of influenza from Nasopharyngeal swab specimens and should not be used as a sole basis for treatment. Nasal washings and aspirates are unacceptable for Xpert Xpress SARS-CoV-2/FLU/RSV testing.  Fact Sheet for Patients: EntrepreneurPulse.com.au  Fact Sheet for Healthcare Providers: IncredibleEmployment.be  This test is not yet approved or cleared by the [place] FDA and has been authorized for detection and/or diagnosis of SARS-CoV-2 by FDA under an Emergency Use Authorization (EUA). This EUA will remain in effect (meaning this test can be used) for the duration of the COVID-19 declaration under Section 564(b)(1) of the Act, 21 U.S.C. section 360bbb-3(b)(1), unless the authorization is terminated or revoked.  Performed at [facility], [address], [place], [place] [address]   Urine Culture     Status: Abnormal   Collection  Time: 01/16/2021  9:13 PM   Specimen: In/Out Cath Urine  Result Value Ref Range Status   Specimen Description   Final    IN/OUT CATH URINE Performed at [facility], [address] [place], [place] [address]    Special Requests   Final    NONE Performed at [facility], [address] [place], [place] [address]    Culture MULTIPLE SPECIES PRESENT, SUGGEST RECOLLECTION (A)  Final   Report Status 01/19/2021 FINAL  Final  Blood Culture (routine x 2)     Status: None   Collection Time: 01/15/2021  9:26 PM   Specimen: BLOOD  Result Value Ref Range Status   Specimen Description BLOOD LEFT ANTECUBITAL  Final   Special Requests   Final    BOTTLES DRAWN AEROBIC AND ANAEROBIC Blood Culture results may not be optimal due to an excessive volume of blood received in culture bottles   Culture   Final    NO GROWTH 5 DAYS Performed at [facility], [address] [place], [place] [address]    Report Status 01/22/2021 FINAL  Final  Blood Culture (routine x 2)     Status: None  Collection Time: 01/11/2021  9:26 PM   Specimen: BLOOD  Result Value Ref Range Status   Specimen Description BLOOD BLOOD RIGHT HAND  Final   Special Requests   Final    BOTTLES DRAWN AEROBIC AND ANAEROBIC Blood Culture adequate volume   Culture   Final    NO GROWTH 5 DAYS Performed at [facility], [address], [place], [place] [address]    Report Status 01/22/2021 FINAL  Final  Culture, blood (x 2)     Status: None   Collection Time: 01/18/21  3:24 AM   Specimen: BLOOD  Result Value Ref Range Status   Specimen Description BLOOD LEFT HAND  Final   Special Requests   Final    BOTTLES DRAWN AEROBIC AND ANAEROBIC Blood Culture adequate volume   Culture   Final    NO GROWTH 5 DAYS Performed at [facility], [address] [place], [place] [address]    Report Status 01/23/2021 FINAL  Final  Culture, blood (x 2)     Status: None   Collection Time: 01/18/21  3:24 AM    Specimen: BLOOD  Result Value Ref Range Status   Specimen Description BLOOD RIGHT HAND  Final   Special Requests   Final    BOTTLES DRAWN AEROBIC AND ANAEROBIC Blood Culture results may not be optimal due to an inadequate volume of blood received in culture bottles   Culture   Final    NO GROWTH 5 DAYS Performed at [facility], [address], [place], [place] [address]    Report Status 01/23/2021 FINAL  Final  SARS CORONAVIRUS 2 (TAT 6-24 HRS) Nasopharyngeal Nasopharyngeal Swab     Status: None   Collection Time: 01/20/21  8:00 PM   Specimen: Nasopharyngeal Swab  Result Value Ref Range Status   SARS Coronavirus 2 NEGATIVE NEGATIVE Final    Comment: (NOTE) SARS-CoV-2 target nucleic acids are NOT DETECTED.  The SARS-CoV-2 RNA is generally detectable in upper and lower respiratory specimens during the acute phase of infection. Negative results do not preclude SARS-CoV-2 infection, do not rule out co-infections with other pathogens, and should not be used as the sole basis for treatment or other patient management decisions. Negative results must be combined with clinical observations, patient history, and epidemiological information. The expected result is Negative.  Fact Sheet for Patients: SugarRoll.be  Fact Sheet for Healthcare Providers: https://www.woods-mathews.com/  This test is not yet approved or cleared by the [place] FDA and  has been authorized for detection and/or diagnosis of SARS-CoV-2 by FDA under an Emergency Use Authorization (EUA). This EUA will remain  in effect (meaning this test can be used) for the duration of the COVID-19 declaration under Se ction 564(b)(1) of the Act, 21 U.S.C. section 360bbb-3(b)(1), unless the authorization is terminated or revoked sooner.  Performed at [facility], [place] [address] [place],  [address]     Coagulation Studies: No results for input(s):  LABPROT, INR in the last 72 hours.  Urinalysis: No results for input(s): COLORURINE, LABSPEC, PHURINE, GLUCOSEU, HGBUR, BILIRUBINUR, KETONESUR, PROTEINUR, UROBILINOGEN, NITRITE, LEUKOCYTESUR in the last 72 hours.  Invalid input(s): APPERANCEUR    Imaging: No results found.   Medications:     atorvastatin  40 mg Oral Daily   Chlorhexidine Gluconate Cloth  6 each Topical Daily   clopidogrel  75 mg Oral QHS   diltiazem  30 mg Oral Q8H   docusate sodium  100 mg Oral BID   ferrous  sulfate  325 mg Oral TID WC   furosemide  40 mg Intravenous Q12H   guaiFENesin  600 mg Oral BID   heparin injection (subcutaneous)  5,000 Units Subcutaneous Q8H   insulin aspart  0-9 Units Subcutaneous TID WC   insulin aspart  2 Units Subcutaneous TID WC   insulin glargine-yfgn  30 Units Subcutaneous QHS   metoprolol tartrate  25 mg Oral BID   pantoprazole  40 mg Oral Daily   polyethylene glycol  17 g Oral Daily   spironolactone  25 mg Oral Daily   vitamin B-12  1,000 mcg Oral Daily   acetaminophen **OR** acetaminophen, chlorpheniramine-HYDROcodone, fluticasone, haloperidol lactate, ipratropium-albuterol, magnesium hydroxide, nitroGLYCERIN, ondansetron **OR** ondansetron (ZOFRAN) IV, traZODone  Assessment/ Plan:  [name] is a 87 y.o. Black male with congestive heart failure, aortic valve replacement, diabetes mellitus type II, hypertension, coronary artery disease status post CABG, CVA, hyperlipidemia, AAA, peripheral vascular disease, who is admitted to [facility] on 01/07/2021 for Acute CHF (congestive heart failure) ([place]) [I50.9] Acute midline low back pain without sciatica [M54.50] Community acquired pneumonia, unspecified laterality [J18.9] Acute congestive heart failure, unspecified heart failure type ([place]) [I50.9] Sepsis, due to unspecified organism, unspecified whether acute organ dysfunction present ([place]) [A41.9]  Acute kidney injury on chronic kidney disease stage IV with proteinuria:  baseline creatinine of 2.66, GFR of 23, on 07/07/20. No IV contrast exposure. Timing is consistent with overdiuresis leading to ATN. No indication for dialysis at this time. - Continue to hold telmasartan -Creatinine slightly improved   Hypertension with chronic kidney disease: 129/47 well controlled with diltiazem and metoprolol.   Acute exacerbation of systolic congestive heart failure: echocardiogram with EF of 45-50%.  -Adequate urine output on furosemide 40 mg IV twice daily.  Shortness of breath remains a concern.  We will also order spironolactone 25 mg daily for additional fluid management. - appreciate cardiology input.   Anemia with chronic kidney disease: hemoglobin 8.5, normocytic.    Diabetes mellitus type II with chronic kidney disease: insulin dependent. Hemoglobin A1c of 6.4%.  - recommend to not restart metformin on discharge.   - Glucose remains elevated   LOS: 9   12/29/20222:06 PM

## 2021-01-26 NOTE — Progress Notes (Signed)
Occupational Therapy Treatment Patient Details Name: DILAN NOVOSAD MRN: [id number] DOB: 1931-05-24 Today's Date: 01/26/2021   History of present illness Patient is a 87 year old male with medical history significant for systolic CHF, stage III chronic kidney disease, type 2 diabetes mellitus, CVA, GERD and dyslipidemia, presented to the ER with acute onset of low back pain for the last couple of days with radiculopathy. Acute on chronic systolic CHF, Right-sided community-acquired pneumonia, Acute hypoxic respiratory failure. Imaging with chronically stable L1-L2 compression fractures   OT comments  Mr. Clagg was struggling heavily for breath this AM. He is on 10 L O2 via [place], O2 sats varying from 84%-86%. Pt makes constant vocalizations in an attempt to breath, with occasional gasping sounds. He is able to carry on a limited conversation, is oriented to place and situation. Describes "trying to breath" as painful. He is able to engage in supine grooming tasks without cueing or physical assists, had eaten his breakfast without assist, and engages in limited UE and LE therex in supine, performing 1 set of 5 repeats before tiring too much to continue. Able to follow instructions for taking fuller, deeper breaths, with O2 sats increasing from 84% to 86% after utilizing this technique. No OOB activities, given pt's SOB. Nurse informed of pt's labored breath.    Recommendations for follow up therapy are one component of a multi-disciplinary discharge planning process, led by the attending physician.  Recommendations may be updated based on patient status, additional functional criteria and insurance authorization.    Follow Up Recommendations  Skilled nursing-short term rehab (<3 hours/day)    Assistance Recommended at Discharge Frequent or constant Supervision/Assistance  Equipment Recommendations       Recommendations for Other Services      Precautions / Restrictions Precautions Precautions:  Fall Restrictions Weight Bearing Restrictions: No       Mobility Bed Mobility Overal bed mobility: Needs Assistance Bed Mobility: Rolling Rolling: Max assist           Patient Response: Anxious  Transfers Overall transfer level: Needs assistance                 General transfer comment: Pt unable to attempt transfers today, 2/2 SOB     Balance Overall balance assessment: Independent                                         ADL either performed or assessed with clinical judgement   ADL Overall ADL's : Needs assistance/impaired Eating/Feeding: Modified independent Eating/Feeding Details (indicate cue type and reason): Pt able to feed self today, following set up Grooming: Set up;Wash/dry face Grooming Details (indicate cue type and reason): Able to wash hands and face thoroughly today, without cueing or assist                                    Extremity/Trunk Assessment Upper Extremity Assessment Upper Extremity Assessment: Generalized weakness   Lower Extremity Assessment Lower Extremity Assessment: Generalized weakness        Vision Baseline Vision/History: 1 Wears glasses Patient Visual Report: No change from baseline     Perception     Praxis      Cognition Arousal/Alertness: Lethargic Behavior During Therapy: WFL for tasks assessed/performed Overall Cognitive Status: No family/caregiver present to determine baseline cognitive functioning  Exercises Other Exercises Other Exercises: UE, LE therex, 1 set X 5 reps   Shoulder Instructions       General Comments Pt laboring heavily for breath    Pertinent Vitals/ Pain       Pain Assessment: 0-10 Pain Score: 7  Breathing: noisy labored breathing, long periods of hyperventilation, Cheyne-Stokes respirations Negative Vocalization: repeated troubled calling out, loud moaning/groaning, crying Facial  Expression: facial grimacing Body Language: tense, distressed pacing, fidgeting Consolability: distracted or reassured by voice/touch PAINAD Score: 8 Pain Location: chest pain/breathing Pain Intervention(s): Limited activity within patient's tolerance;Repositioned;Utilized relaxation techniques  Home Living                                          Prior Functioning/Environment              Frequency  Min 2X/week        Progress Toward Goals  OT Goals(current goals can now be found in the care plan section)  Progress towards OT goals: Not progressing toward goals - comment (Pt very limited by lethargy, SOB this AM)  Acute Rehab OT Goals Patient Stated Goal: to go home OT Goal Formulation: With patient Time For Goal Achievement: 02/02/21  Plan Discharge plan remains appropriate;Frequency remains appropriate    Co-evaluation                 AM-PAC OT "6 Clicks" Daily Activity     Outcome Measure   Help from another person eating meals?: A Little Help from another person taking care of personal grooming?: A Little Help from another person toileting, which includes using toliet, bedpan, or urinal?: A Lot Help from another person bathing (including washing, rinsing, drying)?: A Lot Help from another person to put on and taking off regular upper body clothing?: A Lot Help from another person to put on and taking off regular lower body clothing?: A Lot 6 Click Score: 14    End of Session    OT Visit Diagnosis: Unsteadiness on feet (R26.81);Muscle weakness (generalized) (M62.81)   Activity Tolerance Patient limited by lethargy;Other (comment) (pt limited by SOB)   Patient Left in bed;with call bell/phone within reach;with bed alarm set   Nurse Communication Other (comment) (O2 sats)        Time: 5093-2671 OT Time Calculation (min): 11 min  Charges: OT General Charges $OT Visit: 1 Visit OT Treatments $Self Care/Home Management : 8-22  mins  Josiah Lobo, PhD, MS, OTR/L 01/26/21, 10:46 AM

## 2021-01-26 NOTE — Progress Notes (Signed)
[facility]  Patient Description: Ryan Mcclure is a 87 year old male with PMH significant for HFpEF, ischemic cardiomyopathy, CAD s/p CABG, s/p AoV replacement (bioprosthetic), paroxsymal atrial fibrillation (not on anticoagulation due to h/o GI bleed), h/o CVA, carotid artery atherosclerosis s/p carotid endarterectomy, diabetes mellitus type 2, dyslipidemia, CKD stage III and obesity who was admitted due to acute hypoxic respiratory failure with pulmonary edema.  Cardiology consulted for acute decompensated systolic heart failure and elevated troponin.  SUBJECTIVE: UTA; patient is currently on Bipap and is responding inappropriately at this time.   (01/26/21)-The patient is feeling reasonably well today.  He continues to complain of dyspnea that remains unchanged. He denies having any chest pain, peripheral edema or palpitations at this time. The patient's family is at the bedside and states that the patient is not at his baseline currently. Furthermore, they report that the patient is mostly independent at baseline at his ALF.   OBJECTIVE:   Vitals:   01/25/21 1956 01/25/21 2329 01/26/21 0322 01/26/21 0500  BP: (!) 136/50 (!) 142/55 (!) 162/61   Pulse: 93 62 68   Resp: 18 18 20    Temp: (!) 97.4 F (36.3 C) 97.6 F (36.4 C) 97.6 F (36.4 C)   TempSrc:      SpO2: 98% 100% 99%   Weight:    93.6 kg  Height:         Intake/Output Summary (Last 24 hours) at 01/26/2021 2130 Last data filed at 01/26/2021 0600 Gross per 24 hour  Intake 480 ml  Output 1100 ml  Net -620 ml      PHYSICAL EXAM  General: lethargic,Well developed, well nourished, in no acute distress HEENT:  Normocephalic and atraumatic. PERRL Neck:  >4 cm JVD above clavicle  Lungs: Clear bilaterally in upper lobes, diminished breath sounds bilaterally to lower lobes upon auscultation.  Chest expansion symmetrical.  No wheezes, rales, rhonchi or crackles. Heart: HRRR . Normal S1 and S2 without gallops or murmurs.   Abdomen: rounded, distended, Bowel sounds are positive, abdomen soft and non-tender  Msk:  Normal strength and tone for age. Extremities: No clubbing, cyanosis or edema.   Neuro: alert to voice and touch, murmuring, appears confused Psych:  responds inappropriately    LABS: Basic Metabolic Panel: Recent Labs    01/24/21 0614 01/25/21 0615 01/26/21 0627  NA 142 142 140  K 4.5 4.3 4.6  CL 106 107 105  CO2 23 23 24   GLUCOSE 174* 189* 240*  BUN 85* 92* 94*  CREATININE 4.78* 4.56* 4.37*  CALCIUM 9.8 9.8 9.7  MG 2.5* 2.7*  --   PHOS  --   --  4.4   Liver Function Tests: Recent Labs    01/26/21 0627  ALBUMIN 2.9*   No results for input(s): LIPASE, AMYLASE in the last 72 hours. CBC: Recent Labs    01/24/21 0614 01/25/21 0615  WBC 12.0* 11.5*  HGB 8.1* 8.5*  HCT 26.4* 28.3*  MCV 85.2 85.2  PLT 177 191   Cardiac Enzymes: No results for input(s): CKTOTAL, CKMB, CKMBINDEX, TROPONINI in the last 72 hours. BNP: Invalid input(s): POCBNP D-Dimer: No results for input(s): DDIMER in the last 72 hours. Hemoglobin A1C: No results for input(s): HGBA1C in the last 72 hours. Fasting Lipid Panel: No results for input(s): CHOL, HDL, LDLCALC, TRIG, CHOLHDL, LDLDIRECT in the last 72 hours. Thyroid Function Tests: No results for input(s): TSH, T4TOTAL, T3FREE, THYROIDAB in the last 72 hours.  Invalid input(s): FREET3 Anemia Panel: Recent Labs  01/24/21 0614  FERRITIN 544*  TIBC 115*  IRON 30*    DG Chest Port 1 View  Result Date: 01/24/2021 CLINICAL DATA:  Hypoxia. EXAM: PORTABLE CHEST 1 VIEW COMPARISON:  01/23/2021 FINDINGS: Moderate enlargement of the cardiopericardial silhouette with indistinct pulmonary vasculature and hazy bilateral airspace opacities in both lungs along with a few Kerley B lines best seen in the left lung. Obscured left hemidiaphragm due to airspace opacity. Hazy indistinct opacity along the right hemidiaphragm with partial obscuration. Aortic valve  prosthesis. Aortic atherosclerotic calcification. Prior CABG. Thoracic spondylosis. IMPRESSION: 1. Indistinct pulmonary vasculature with Kerley B lines and patchy bilateral airspace opacities favoring acute pulmonary edema. 2. Obscured left hemidiaphragm, compatible with more confluent airspace opacity at the left lung base potentially from atelectasis or pneumonia. A component of left pleural effusion is not excluded. There is a lesser degree of hazy obscuration of the right hemidiaphragm. 3. Thoracic spondylosis. 4.  Aortic Atherosclerosis (ICD10-I70.0). 5. Prior CABG.  Aortic valve prosthesis. Electronically Signed   By: Van Clines M.D.   On: 01/24/2021 11:25     Echo: (01/18/21) IMPRESSIONS   1. Left ventricular ejection fraction, by estimation, is 45 to 50%. The  left ventricle has mildly decreased function. The left ventricle has no  regional wall motion abnormalities. Left ventricular diastolic parameters  were normal.   2. Right ventricular systolic function is normal. The right ventricular  size is normal.   3. Left atrial size was mildly dilated.   4. The mitral valve is normal in structure. Mild mitral valve  regurgitation. No evidence of mitral stenosis.   5. The aortic valve is normal in structure. Aortic valve regurgitation is  not visualized. Aortic valve sclerosis is present, with no evidence of  aortic valve stenosis.   6. The inferior vena cava is normal in size with greater than 50%  respiratory variability, suggesting right atrial pressure of 3 mmHg.   TELEMETRY: Atrial fibrillation with frequent unifocal PVCs and a heart rate of 72 bpm.  Occasional brief episodes of nonsustained ventricular tachycardia noted upon telemetry review over the last 24 hours.  ASSESSMENT AND PLAN:  Principal Problem:   Acute CHF (congestive heart failure) (HCC)    # Acute systolic CHF #Ischemic cardiomyopathy # s/p AoV replacement (bioprosthetic) Echocardiogram from 12/21  revealed low normal LV systolic function with estimated EF of 45-50%, mildly dilated LA, mild mitral valve regurgitation and aortic valve sclerosis.  The patient appears hypervolemic at this time.  Diuretics initially held due to worsening creatinine; however, nephrology was consulted and cardiorenal syndrome was suspected, therefore, diuretic therapy was restarted on 12/28.   - decrease IV lasix to 20mg  twice daily due to worsening BUN/creatinine.   -ARB held due to AKI on CKD.  -Continue metoprolol tartrate 25mg  twice daily.   -Strict I's and O's, daily weights, low-sodium diet.   # Elevated troponin  # CAD # s/p CABG  Initial troponin elevated at 45>> 262>>237 likely secondary to demand ischemia.  Echocardiogram as above. UTA to assess for chest pain at this time.   -Continue beta-blocker as above.  -Continue statin and Plavix.  -Continue nitroglycerin as needed for chest pain.  -Consider nuclear stress test as outpatient.  # Atrial fibrillation  # NSVT  The patient continues to be in atrial fibrillation with frequent PVCs on the telemetry monitor.  Upon telemetry monitor review the patient continues to have brief episodes of NSVT.  Patient has a CHA2DS2-VASc score of 6 but is a poor anticoagulant  candidate due to history of GI bleed.  -Continue beta-blocker as above.  -Continue diltiazem as needed for rapid ventricular response.  -Continuous cardiac telemetry monitoring until discharge.  #Acute hypoxemic respiratory failure likely secondary to CAP #Acute hypercapnic respiratory failure The patient is currently on bipap as managed by respiratory. Oxygen saturation is at 99%.   -Agree with current management.  # AMS  Likely secondary to underlying disease process, age related and current hospitalization. Negative for any s/s of acute stroke at this time.   - Management per hospitalist team.   # HTN Blood pressure appears to be elevated at this time likely secondary to agitation  overnight.   - Consider increasing beta blocker dosage for better blood pressure control.   -Continue antihypertensives as above.  # DM2  -Agree with current management.  #AKI on CKD BUN/creat= 94/4.37 on today which is trending upward.   -Recommendations to decrease diuresis as above.   - Nephrology consult appreciated.     [place], [id number]-AG  01/26/2021 7:42 AM

## 2021-01-26 NOTE — Progress Notes (Addendum)
PROGRESS NOTE    Ryan Mcclure  CHE:[id number] DOB: September 30, 1931 DOA: 01/21/2021 PCP: [place] Pink, MD   Brief Narrative: Ryan Mcclure is a 87 y.o. male with a history of reduced EF heart failure, CKD stage IV, diabetes mellitus type 2, CVA with, AVR with bioprosthetic valve, CAD status post CABG/stents, GI bleeding.  Patient presented secondary to lower back pain and cough and found to have hypoxia on admission with evidence of vascular congestion.  Patient with associated elevated troponin.  Patient started heart failure with resultant worsening kidney.  Nephrology consult with decision patient still requiring oxygen supplementation.   Assessment & Plan:   Principal Problem:   Acute CHF (congestive heart failure) (HCC)   Acute respiratory failure with hypoxia Acute pulmonary edema Secondary to acute systolic heart failure.  Patient is now on oxygen as an outpatient approximately 6 L with via nasal cannula and BiPAP as well.  Chest x-ray from 12/27 with persistent pulmonary edema plus or minus -Continue oxygen as able with diuresis  Acute respiratory failure with hypercarbia Recent ABG (12/26) with a pH of 7.28 and PCO2 of 54 and O2 of 69.  Started on BiPAP. Worsening hypoxia while on BiPAP with ABG (12/29) of 7.28/58/51/27.3. -Continue BiPAP -Consult PCCM and transfer to ICU for persistent/worsening respiratory failure  Acute systolic heart failure Echo obtained on 12/22 significant for an EF of 45 to 50%.  Patient was diuresed with Lasix IV which was held secondary to worsening creatinine.  Nephrology consulted and have resumed diuresis. Creatinine stable. UOP of 1.1 L over the last 24 hours. -Nephrology recommendations: Lasix 40 mg IV twice daily  AKI on CKD stage IV Baseline creatinine of about 2.66.  Concern for possible ATN from overdiuresis.  Diuretics initially held but now has been restarted secondary to heart failure.  Telmisartan held.  Creatinine peaked 4.78 and  appears to be trending down slightly.  Primary hypertension -Continue diltiazem 30 mg 3 times daily and metoprolol 25 mg twice daily  Chronic anemia Likely secondary to CKD.  Baseline hemoglobin varies from 8-11 hemoglobin stable.  Diabetes mellitus, type II Uncontrolled with hyperglycemia.  Patient is on metformin as an outpatient which is recommended to be discontinued secondary to CKD on discharge.  Patient is also on insulin as an outpatient. Blood sugar not controlled. -Increase to Semglee 30 units, NovoLog 2 units 3 times daily with meals and sliding scale insulin  Community-acquired pneumonia Patient treated empirically with ceftriaxone and azithromycin and transition to Omnicef to complete a total of 7 days of antibiotics.  SVT PVCs -Continue metoprolol/diltiazem  Right upper lobe lung nodule Noted incidentally on imaging.  1.2 cm in size.  Recommendation for repeat CT in 3 to 6 months  Demand ischemia Elevated troponin. In setting of acute heart failure.   DVT prophylaxis: Heparin subcutaneous Code Status:   Code Status: Full Code Family Communication: None at bedside.  Sister on telephone; discussed transfer to [place] and PCCM consult in addition to risk for intubation Disposition Plan: Discharge to SNF pending ability to wean oxygen down and improvement of acute heart failure/AKI   Consultants:  Cardiology Nephrology PCCM  Procedures:  TRANSTHORACIC ECHOCARDIOGRAM (01/18/2021) IMPRESSIONS     1. Left ventricular ejection fraction, by estimation, is 45 to 50%. The  left ventricle has mildly decreased function. The left ventricle has no  regional wall motion abnormalities. Left ventricular diastolic parameters  were normal.   2. Right ventricular systolic function is normal. The right ventricular  size  is normal.   3. Left atrial size was mildly dilated.   4. The mitral valve is normal in structure. Mild mitral valve  regurgitation. No evidence of  mitral stenosis.   5. The aortic valve is normal in structure. Aortic valve regurgitation is  not visualized. Aortic valve sclerosis is present, with no evidence of  aortic valve stenosis.   6. The inferior vena cava is normal in size with greater than 50%  respiratory variability, suggesting right atrial pressure of 3 mmHg.   Antimicrobials: Ceftriaxone Cefdinir p.o. Azithromycin IV   Subjective: Patient reports shortness of breath  Objective: Vitals:   01/26/21 1016 01/26/21 1037 01/26/21 1038 01/26/21 1129  BP:    (!) 146/59  Pulse: 62   61  Resp: (!) 28  20 18   Temp:    98 F (36.7 C)  TempSrc:      SpO2:  (!) 84% 96% 93%  Weight:      Height:        Intake/Output Summary (Last 24 hours) at 01/26/2021 1209 Last data filed at 01/26/2021 0950 Gross per 24 hour  Intake 360 ml  Output 1100 ml  Net -740 ml    Filed Weights   01/24/21 0148 01/25/21 0500 01/26/21 0500  Weight: 92.9 kg 93 kg 93.6 kg    Examination:  General exam: Appears calm and comfortable and in no acute distress. Somewhat conversant. BiPAP on face. Respiratory: Diminished but clear on anterior auscultation. Respiratory effort normal with no intercostal retractions or use of accessory muscles Cardiovascular: S1 & S2 heard, RRR. No murmurs, rubs, gallops or clicks. Gastrointestinal: Abdomen is distended, soft and non-tender. No masses felt. Normal bowel sounds heard Neurologic: No focal neurological deficits. Follows commands Musculoskeletal: No calf tenderness Skin: No cyanosis. No new rashes Psychiatry: Alert and oriented to person. Memory impaired. Mood & affect appropriate.   Data Reviewed: I have personally reviewed following labs and imaging studies  CBC Lab Results  Component Value Date   WBC 11.5 (H) 01/25/2021   RBC 3.32 (L) 01/25/2021   HGB 8.5 (L) 01/25/2021   HCT 28.3 (L) 01/25/2021   MCV 85.2 01/25/2021   MCH 25.6 (L) 01/25/2021   PLT 191 01/25/2021   MCHC 30.0 01/25/2021    RDW 14.6 01/25/2021   LYMPHSABS 0.9 01/19/2021   MONOABS 1.5 (H) 01/19/2021   EOSABS 0.1 01/19/2021   BASOSABS 0.0 58/85/0277     Last metabolic panel Lab Results  Component Value Date   NA 140 01/26/2021   K 4.6 01/26/2021   CL 105 01/26/2021   CO2 24 01/26/2021   BUN 94 (H) 01/26/2021   CREATININE 4.37 (H) 01/26/2021   GLUCOSE 240 (H) 01/26/2021   GFRNONAA 12 (L) 01/26/2021   GFRAA 46 (L) 08/16/2017   CALCIUM 9.7 01/26/2021   PHOS 4.4 01/26/2021   PROT 6.5 01/19/2021   ALBUMIN 2.9 (L) 01/26/2021   BILITOT 0.8 01/19/2021   ALKPHOS 59 01/19/2021   AST 16 01/19/2021   ALT 12 01/19/2021   ANIONGAP 11 01/26/2021    CBG (last 3)  Recent Labs    01/25/21 1626 01/25/21 2015 01/26/21 0813  GLUCAP 234* 216* 231*      GFR: Estimated Creatinine Clearance: 13 mL/min (A) (by C-G formula based on SCr of 4.37 mg/dL (H)).  Coagulation Profile: No results for input(s): INR, PROTIME in the last 168 hours.  Recent Results (from the past 240 hour(s))  Resp Panel by RT-PCR (Flu A&B, Covid) Nasopharyngeal Swab  Status: None   Collection Time: 01/26/2021  8:55 PM   Specimen: Nasopharyngeal Swab; Nasopharyngeal(NP) swabs in vial transport medium  Result Value Ref Range Status   SARS Coronavirus 2 by RT PCR NEGATIVE NEGATIVE Final    Comment: (NOTE) SARS-CoV-2 target nucleic acids are NOT DETECTED.  The SARS-CoV-2 RNA is generally detectable in upper respiratory specimens during the acute phase of infection. The lowest concentration of SARS-CoV-2 viral copies this assay can detect is 138 copies/mL. A negative result does not preclude SARS-Cov-2 infection and should not be used as the sole basis for treatment or other patient management decisions. A negative result may occur with  improper specimen collection/handling, submission of specimen other than nasopharyngeal swab, presence of viral mutation(s) within the areas targeted by this assay, and inadequate number of  viral copies(<138 copies/mL). A negative result must be combined with clinical observations, patient history, and epidemiological information. The expected result is Negative.  Fact Sheet for Patients:  EntrepreneurPulse.com.au  Fact Sheet for Healthcare Providers:  IncredibleEmployment.be  This test is no t yet approved or cleared by the [place] FDA and  has been authorized for detection and/or diagnosis of SARS-CoV-2 by FDA under an Emergency Use Authorization (EUA). This EUA will remain  in effect (meaning this test can be used) for the duration of the COVID-19 declaration under Section 564(b)(1) of the Act, 21 U.S.C.section 360bbb-3(b)(1), unless the authorization is terminated  or revoked sooner.       Influenza A by PCR NEGATIVE NEGATIVE Final   Influenza B by PCR NEGATIVE NEGATIVE Final    Comment: (NOTE) The Xpert Xpress SARS-CoV-2/FLU/RSV plus assay is intended as an aid in the diagnosis of influenza from Nasopharyngeal swab specimens and should not be used as a sole basis for treatment. Nasal washings and aspirates are unacceptable for Xpert Xpress SARS-CoV-2/FLU/RSV testing.  Fact Sheet for Patients: EntrepreneurPulse.com.au  Fact Sheet for Healthcare Providers: IncredibleEmployment.be  This test is not yet approved or cleared by the [place] FDA and has been authorized for detection and/or diagnosis of SARS-CoV-2 by FDA under an Emergency Use Authorization (EUA). This EUA will remain in effect (meaning this test can be used) for the duration of the COVID-19 declaration under Section 564(b)(1) of the Act, 21 U.S.C. section 360bbb-3(b)(1), unless the authorization is terminated or revoked.  Performed at [facility], [address] [place], [place] [address]   Urine Culture     Status: Abnormal   Collection Time: 01/04/2021  9:13 PM   Specimen: In/Out Cath Urine   Result Value Ref Range Status   Specimen Description   Final    IN/OUT CATH URINE Performed at [facility], [address] [place], [place] [address]    Special Requests   Final    NONE Performed at [facility], [address] [place], [place] [address]    Culture MULTIPLE SPECIES PRESENT, SUGGEST RECOLLECTION (A)  Final   Report Status 01/19/2021 FINAL  Final  Blood Culture (routine x 2)     Status: None   Collection Time: 01/28/2021  9:26 PM   Specimen: BLOOD  Result Value Ref Range Status   Specimen Description BLOOD LEFT ANTECUBITAL  Final   Special Requests   Final    BOTTLES DRAWN AEROBIC AND ANAEROBIC Blood Culture results may not be optimal due to an excessive volume of blood received in culture bottles   Culture   Final    NO GROWTH 5 DAYS Performed at [facility] : [facility],  [place], [place] [address]    Report Status 01/22/2021 FINAL  Final  Blood Culture (routine x 2)     Status: None   Collection Time: 01/06/2021  9:26 PM   Specimen: BLOOD  Result Value Ref Range Status   Specimen Description BLOOD BLOOD RIGHT HAND  Final   Special Requests   Final    BOTTLES DRAWN AEROBIC AND ANAEROBIC Blood Culture adequate volume   Culture   Final    NO GROWTH 5 DAYS Performed at [facility], [address], [place], [place] [address]    Report Status 01/22/2021 FINAL  Final  Culture, blood (x 2)     Status: None   Collection Time: 01/18/21  3:24 AM   Specimen: BLOOD  Result Value Ref Range Status   Specimen Description BLOOD LEFT HAND  Final   Special Requests   Final    BOTTLES DRAWN AEROBIC AND ANAEROBIC Blood Culture adequate volume   Culture   Final    NO GROWTH 5 DAYS Performed at [facility], [address] [place], [place] [address]    Report Status 01/23/2021 FINAL  Final  Culture, blood (x 2)     Status: None   Collection Time: 01/18/21  3:24 AM   Specimen: BLOOD  Result Value Ref Range Status    Specimen Description BLOOD RIGHT HAND  Final   Special Requests   Final    BOTTLES DRAWN AEROBIC AND ANAEROBIC Blood Culture results may not be optimal due to an inadequate volume of blood received in culture bottles   Culture   Final    NO GROWTH 5 DAYS Performed at [facility], [address] [place], [place] [address]    Report Status 01/23/2021 FINAL  Final  SARS CORONAVIRUS 2 (TAT 6-24 HRS) Nasopharyngeal Nasopharyngeal Swab     Status: None   Collection Time: 01/20/21  8:00 PM   Specimen: Nasopharyngeal Swab  Result Value Ref Range Status   SARS Coronavirus 2 NEGATIVE NEGATIVE Final    Comment: (NOTE) SARS-CoV-2 target nucleic acids are NOT DETECTED.  The SARS-CoV-2 RNA is generally detectable in upper and lower respiratory specimens during the acute phase of infection. Negative results do not preclude SARS-CoV-2 infection, do not rule out co-infections with other pathogens, and should not be used as the sole basis for treatment or other patient management decisions. Negative results must be combined with clinical observations, patient history, and epidemiological information. The expected result is Negative.  Fact Sheet for Patients: SugarRoll.be  Fact Sheet for Healthcare Providers: https://www.woods-mathews.com/  This test is not yet approved or cleared by the [place] FDA and  has been authorized for detection and/or diagnosis of SARS-CoV-2 by FDA under an Emergency Use Authorization (EUA). This EUA will remain  in effect (meaning this test can be used) for the duration of the COVID-19 declaration under Se ction 564(b)(1) of the Act, 21 U.S.C. section 360bbb-3(b)(1), unless the authorization is terminated or revoked sooner.  Performed at [facility], [place] [address], [place], [place] [address]          Radiology Studies: No results found.      Scheduled Meds:  atorvastatin  40 mg Oral  Daily   clopidogrel  75 mg Oral QHS   diltiazem  30 mg Oral Q8H   docusate sodium  100 mg Oral BID   ferrous sulfate  325 mg Oral TID WC   furosemide  40 mg Intravenous Q12H  guaiFENesin  600 mg Oral BID   heparin injection (subcutaneous)  5,000 Units Subcutaneous Q8H   insulin aspart  0-9 Units Subcutaneous TID WC   insulin aspart  2 Units Subcutaneous TID WC   insulin glargine-yfgn  24 Units Subcutaneous QHS   metoprolol tartrate  25 mg Oral BID   pantoprazole  40 mg Oral Daily   polyethylene glycol  17 g Oral Daily   spironolactone  25 mg Oral Daily   vitamin B-12  1,000 mcg Oral Daily   Continuous Infusions:   LOS: 9 days     Cordelia Poche, MD [facility] 01/26/2021, 12:09 PM  If 7PM-7AM, please contact night-coverage [email or web address]

## 2021-01-26 NOTE — Progress Notes (Deleted)
After reviewing the patient's chart, epic notes, labs, and imaging, I received a secure chat from Dr. Merrilee Jansky at 1pm.  He shared that family would like to move forward with comfort measures and if patient is stable he can be transferred to hospice care.    Notified Dr. Merrilee Jansky as well as primary attending Dr. Lonny Prude that since goals are clear and patient would like to move forward with hospice then [place] worker can reach out to hospice liaison to facilitate hospice evaluation.  Afternoon update: Received secure chat from NP Dewaine Conger at 1420 that family would like to move forward with comfort measures, monitor pt overnight to see if he stabilizes, and if so they would like for him to be considered for hospice placement.  Goals are clear.  Plan is set.  Patient is DNR.    Palliative medicine will continue to monitor the patient throughout his hospitalization.    Dr. Merrilee Jansky, NP Dewaine Conger and Dr. Lonny Prude all made aware that PMT is available if needed but that PMT will monitor the patient peripherally for now since plan is clear and set.   [place] Ilsa Iha, FNP-BC Palliative Medicine Team Team Phone # [phone]  NO CHARGE

## 2021-01-26 NOTE — Progress Notes (Signed)
PT Cancellation Note  Patient Details Name: Ryan Mcclure MRN: [id number] DOB: Jan 07, 1932   Cancelled Treatment:     PT attempt. PT hold. Per RN/MD, pt is being transferred to high level of care. Will need continue upon transfer orders to continue skilled PT after being transferred.    [place]lette Pa 01/26/2021, 11:31 AM

## 2021-01-26 NOTE — Progress Notes (Signed)
Inpatient Diabetes Program Recommendations  AACE/ADA: New Consensus Statement on Inpatient Glycemic Control   Target Ranges:  Prepandial:   less than 140 mg/dL      Peak postprandial:   less than 180 mg/dL (1-2 hours)      Critically ill patients:  140 - 180 mg/dL    Latest Reference Range & Units 01/25/21 07:51 01/25/21 11:26 01/25/21 16:26 01/25/21 20:15 01/26/21 08:13  Glucose-Capillary 70 - 99 mg/dL 254 (H) 229 (H) 234 (H) 216 (H) 231 (H)  (H): Data is abnormally high  Review of Glycemic Control  Current orders for Inpatient glycemic control: Semglee 24 units QHS, Novolog 0-9 units TID, Novolog 2 units TID with meals   Inpatient Diabetes Program Recommendations:     Insulin: Please consider increasing Semglee to 26 units QHS and meal coverage to Novolog 3 units TID with meals.   Thanks, Barnie Alderman, RN, MSN, CDE Diabetes Coordinator Inpatient Diabetes Program [phone] (Team Pager from 8am to 5pm)

## 2021-01-26 NOTE — Progress Notes (Signed)
Tried putting pt on O2 6lpm via [place] this morning, kept it on for about 25mins. Pt cannot tolerate it for now. With desaturation of 88-89%. Pt also verbalized he cannot breathe. Put him back on Bipap. Latest O2 sat 95%. Secured chat Dr. Lonny Prude regarding this. Will continue to monitor.

## 2021-01-26 NOTE — Plan of Care (Signed)

## 2021-01-26 NOTE — Progress Notes (Signed)
BRIEF PCCM NOTE  BRIEF PT DESCRIPTION / SYNOPSIS 87 y.o Male  with a history of reduced EF heart failure, CKD stage IV, diabetes mellitus type 2, CVA with, AVR with bioprosthetic valve, CAD status post CABG/stents, GI bleeding who is admitted with Acute Hypoxic & Hypercapnic Respiratory Failure in the setting of Acute Decompensated HFrEF (EF 45-50% on Echo 01/19/21) and community acquired pneumonia, along with AKI on CKD stage IV.  Cardiology and Nephrology were consulted.  He was diuresed, but developed worsening AKI, of which diuresis had to be held due to concern for overdiuresis.  INTERVAL HISTORY On 12/29 he became increasingly restless, with increased work of breathing and escalating FiO2 requirements eventually necessitating BiPAP.  ABG with hypercapnia and hypoxia (pH 7.28/PCO2 58/PO2 51/HCO3 27.3).  Follow-up chest ray x-ray showed continues to show bilateral airspace disease consistent with pulmonary edema.  He was transferred over to ICU due to high risk for intubation. PCCM consulted.  Upon PCCM's arrival to bedside, pt's cousin Ovid Curd is at bedside Ovid Curd is a former Therapist, sports at [employer]).  Riley set up a conference call with the patient's sister [name] [facility]).  Myself, ICU attending Dr. Merrilee Jansky, Joycelyn Das RN, and [place] were all present in the room for the call.  Both Ovid Curd and [place] endorse that they do not feel it would be in the pt's best interest to proceed with intubation or other aggressive measures.  They DO NOT want the pt to suffer any more, and want to ensure that he is comfortable.  They request that he be made a DNR/DNI and that he be transferred to [facility].  Palliative Care Consulted.   Shortly thereafter, Mr. Podolsky is becoming increasingly lethargic, with increased work of breathing (RR 40's) and O2 sats are decreasing to the low 90's despite remaining on BiPAP (previously 100% upon arrival to ICU).  He appears to be suffering and in the dying process.  Call made again to pt's  sister [name] [facility]) to update her on pt's decline with increased work of breathing and suffering.  Mary requests that we go ahead and transition to COMFORT CARE at this time.  If Mr Acton does not pass away quickly and is stable for transport, then we will try to arrange for transfer to Hospice at that time.   S: -Pt is lethargic/somnolent -Increased work of breathing with rapid shallow respirations on BiPAP (RR 40's), SpO2 now decreasing to low 90's (was 100% upon arrival to ICU) -Appears to be suffering and in the dying process   O: Today's Vitals   01/26/21 1129 01/26/21 1219 01/26/21 1300 01/26/21 1400  BP: (!) 146/59 (!) 149/46 (!) 129/47 (!) 150/82  Pulse: 61 63 (!) 50 (!) 25  Resp: 18 (!) 23 19 (!) 26  Temp: 98 F (36.7 C) (!) 94.4 F (34.7 C)    TempSrc:  Axillary    SpO2: 93% 96% 95% 92%  Weight:      Height:      PainSc:       Body mass index is 32.32 kg/m.   ASSESSMENT / PLAN: -Transition to COMFORT CARE -DNR/DNI -Morphine gtt with PRN boluses for pain/air hunger -Prn Ativan and Haldol -If pt does not pass quickly and is stable for transport, will try and arrange for transfer to [place] consulted, appreciate input       PCCM will sign off at this time as pt is now CC.  Care time: 30 minutes  Darel Hong, AGACNP-BC [place]  Pulmonary & Critical Care Prefer epic messenger for cross cover needs If after hours, please call E-link

## 2021-01-29 NOTE — Death Summary Note (Signed)
DEATH SUMMARY   Patient Details  Name: Ryan Mcclure MRN: [id number] DOB: 06/04/1931  Admission/Discharge Information   Admit Date:  01/11/2021  Date of Death: Date of Death: 02-06-21  Time of Death: Time of Death: 0150  Length of Stay: 18-Apr-2022  Referring Physician: Maryland Pink, MD   Reason(s) for Hospitalization  Acute systolic heart failure  Diagnoses  Preliminary cause of death: Acute pulmonary edema Secondary Diagnoses (including complications and co-morbidities):     Brief Hospital Course (including significant findings, care, treatment, and services provided and events leading to death)  Ryan Mcclure is a 88 y.o. year old male with a history of reduced EF heart failure, CKD stage IV, diabetes mellitus type 2, CVA with, AVR with bioprosthetic valve, CAD status post CABG/stents, GI bleeding.  Patient presented secondary to lower back pain and cough and found to have hypoxia on admission with evidence of vascular congestion.  Patient with associated elevated troponin.  Patient started heart failure with resultant worsening kidney. Nephrology consult with decision patient still requiring oxygen supplementation. Diuresis continued without adequate effect. Patient unable to wean off of BiPAP and continued to develop worsening hypoxia. Patient was transferred to ICU for hypoxia on BiPAP with impending need for mechanical ventilation. Prior to consideration for mechanical ventilation, goals of care were discussed and patient transitioned to full comfort measures. Morphine IV drip started for comfort and BiPAP removed.   Pertinent Labs and Studies  Significant Diagnostic Studies US RENAL  Result Date: 01/23/2021 CLINICAL DATA:  Acute kidney failure EXAM: RENAL / URINARY TRACT ULTRASOUND COMPLETE COMPARISON:  None. FINDINGS: Right Kidney: Renal measurements: 10.2 x 5.2 x 6.2 cm = volume: 172 mL. Echogenicity within normal limits. No mass or hydronephrosis visualized. Left Kidney: Renal  measurements: 11.7 x 6.1 x 5.5 cm = volume: 204 mL. Echogenicity within normal limits. No mass or hydronephrosis visualized. Bladder: Appears normal for degree of bladder distention. Other: None. IMPRESSION: No acute findings.  No hydronephrosis. Electronically Signed   By: Rolm Baptise M.D.   On: 01/23/2021 12:43   CT T-SPINE NO CHARGE  Result Date: January 27, 2021 CLINICAL DATA:  lower mid back pain for approx 3 weeks that comes and goes. Denies falling or injury, denies hx of back pain, denies urinary changes. EXAM: CT THORACIC AND LUMBAR SPINE WITHOUT CONTRAST TECHNIQUE: Multidetector CT imaging of the thoracic and lumbar spine was performed without contrast. Multiplanar CT image reconstructions were also generated. COMPARISON:  MRI lumbar spine 08/24/2009 FINDINGS: CT THORACIC SPINE FINDINGS Alignment: Normal. Vertebrae: Diffusely decreased bone density. Multilevel mild degenerative changes of the spine. Superior endplate T 7 Schmorl node. No acute fracture or focal pathologic process. Paraspinal and other soft tissues: Negative. Disc levels: Maintained. CT LUMBAR SPINE FINDINGS Segmentation: 5 lumbar type vertebrae. Alignment: Normal. Vertebrae: Diffusely decreased bone density. Chronic compression fracture of the L1 vertebral body with greater than 35% vertebral body height loss. Chronic compression fracture of the L2 vertebral body with greater than 45% height loss. No acute fracture or focal pathologic process. Paraspinal and other soft tissues: Negative. Disc levels: Maintained. IMPRESSION: CT THORACIC SPINE IMPRESSION No acute displaced fracture or traumatic listhesis of the thoracic spine. CT LUMBAR SPINE IMPRESSION No acute displaced fracture or traumatic listhesis of the lumbar spine in a patient with chronic grossly stable L1 and L2 compression fractures. Please see separately dictated CT chest, abdomen, pelvis 01/08/2021. Aortic Atherosclerosis (ICD10-I70.0) and Emphysema (ICD10-J43.9).  Electronically Signed   By: Iven Finn M.D.   On: 12/30/2020 22:46  CT L-SPINE NO CHARGE  Result Date: 01/21/2021 CLINICAL DATA:  lower mid back pain for approx 3 weeks that comes and goes. Denies falling or injury, denies hx of back pain, denies urinary changes. EXAM: CT THORACIC AND LUMBAR SPINE WITHOUT CONTRAST TECHNIQUE: Multidetector CT imaging of the thoracic and lumbar spine was performed without contrast. Multiplanar CT image reconstructions were also generated. COMPARISON:  MRI lumbar spine 08/24/2009 FINDINGS: CT THORACIC SPINE FINDINGS Alignment: Normal. Vertebrae: Diffusely decreased bone density. Multilevel mild degenerative changes of the spine. Superior endplate T 7 Schmorl node. No acute fracture or focal pathologic process. Paraspinal and other soft tissues: Negative. Disc levels: Maintained. CT LUMBAR SPINE FINDINGS Segmentation: 5 lumbar type vertebrae. Alignment: Normal. Vertebrae: Diffusely decreased bone density. Chronic compression fracture of the L1 vertebral body with greater than 35% vertebral body height loss. Chronic compression fracture of the L2 vertebral body with greater than 45% height loss. No acute fracture or focal pathologic process. Paraspinal and other soft tissues: Negative. Disc levels: Maintained. IMPRESSION: CT THORACIC SPINE IMPRESSION No acute displaced fracture or traumatic listhesis of the thoracic spine. CT LUMBAR SPINE IMPRESSION No acute displaced fracture or traumatic listhesis of the lumbar spine in a patient with chronic grossly stable L1 and L2 compression fractures. Please see separately dictated CT chest, abdomen, pelvis 01/10/2021. Aortic Atherosclerosis (ICD10-I70.0) and Emphysema (ICD10-J43.9). Electronically Signed   By: Iven Finn M.D.   On: 01/02/2021 22:46   DG Chest Port 1 View  Result Date: 01/26/2021 CLINICAL DATA:  Hypoxia. EXAM: PORTABLE CHEST 1 VIEW COMPARISON:  01/24/2021 FINDINGS: Postop CABG. Elevated right hemidiaphragm.  Bibasilar airspace disease. Mild improvement on the right and little change on the left. Small pleural effusions. IMPRESSION: Bilateral airspace disease with mild improvement on the right. Differential diagnosis includes pneumonia and edema. Electronically Signed   By: Franchot Gallo M.D.   On: 01/26/2021 14:33   DG Chest Port 1 View  Result Date: 01/24/2021 CLINICAL DATA:  Hypoxia. EXAM: PORTABLE CHEST 1 VIEW COMPARISON:  01/05/2021 FINDINGS: Moderate enlargement of the cardiopericardial silhouette with indistinct pulmonary vasculature and hazy bilateral airspace opacities in both lungs along with a few Kerley B lines best seen in the left lung. Obscured left hemidiaphragm due to airspace opacity. Hazy indistinct opacity along the right hemidiaphragm with partial obscuration. Aortic valve prosthesis. Aortic atherosclerotic calcification. Prior CABG. Thoracic spondylosis. IMPRESSION: 1. Indistinct pulmonary vasculature with Kerley B lines and patchy bilateral airspace opacities favoring acute pulmonary edema. 2. Obscured left hemidiaphragm, compatible with more confluent airspace opacity at the left lung base potentially from atelectasis or pneumonia. A component of left pleural effusion is not excluded. There is a lesser degree of hazy obscuration of the right hemidiaphragm. 3. Thoracic spondylosis. 4.  Aortic Atherosclerosis (ICD10-I70.0). 5. Prior CABG.  Aortic valve prosthesis. Electronically Signed   By: Van Clines M.D.   On: 01/24/2021 11:25   DG Chest Port 1 View  Result Date: 01/06/2021 CLINICAL DATA:  Back pain for 3 weeks, initial encounter EXAM: PORTABLE CHEST 1 VIEW COMPARISON:  11/07/2019 FINDINGS: Postsurgical changes are again seen and stable. Cardiac shadow is mildly enlarged. Aortic calcifications are noted. Lungs are clear bilaterally. Mild vascular congestion is noted centrally. No bony abnormality is noted. IMPRESSION: Mild vascular congestion without other acute abnormality  Electronically Signed   By: Inez Catalina M.D.   On: 01/04/2021 21:13   ECHOCARDIOGRAM COMPLETE  Result Date: 01/19/2021    ECHOCARDIOGRAM REPORT   Patient Name:   Ryan Mcclure Date of  Exam: 01/18/2021 Medical Rec #:  [id number]      Height:       67.0 in Accession #:    [id number]     Weight:       165.3 lb Date of Birth:  1931-08-06      BSA:          1.865 m Patient Age:    88 years       BP:           134/68 mmHg Patient Gender: M              HR:           73 bpm. Exam Location:  [facility] Procedure: 2D Echo, Color Doppler and Cardiac Doppler Indications:     I50.31 congestive heart failure-Acute Diastolic  History:         Patient has prior history of Echocardiogram examinations. CHF,                  Prior CABG, CKD, stage III and Stroke; Risk Factors:Diabetes                  and HCL.  Sonographer:     Charmayne Sheer Referring Phys:  [id number] Philip Diagnosing Phys: Isaias Cowman MD IMPRESSIONS  1. Left ventricular ejection fraction, by estimation, is 45 to 50%. The left ventricle has mildly decreased function. The left ventricle has no regional wall motion abnormalities. Left ventricular diastolic parameters were normal.  2. Right ventricular systolic function is normal. The right ventricular size is normal.  3. Left atrial size was mildly dilated.  4. The mitral valve is normal in structure. Mild mitral valve regurgitation. No evidence of mitral stenosis.  5. The aortic valve is normal in structure. Aortic valve regurgitation is not visualized. Aortic valve sclerosis is present, with no evidence of aortic valve stenosis.  6. The inferior vena cava is normal in size with greater than 50% respiratory variability, suggesting right atrial pressure of 3 mmHg. FINDINGS  Left Ventricle: Left ventricular ejection fraction, by estimation, is 45 to 50%. The left ventricle has mildly decreased function. The left ventricle has no regional wall motion abnormalities. The left ventricular internal cavity size was  normal in size. There is no left ventricular hypertrophy. Left ventricular diastolic parameters were normal. Right Ventricle: The right ventricular size is normal. No increase in right ventricular wall thickness. Right ventricular systolic function is normal. Left Atrium: Left atrial size was mildly dilated. Right Atrium: Right atrial size was normal in size. Pericardium: There is no evidence of pericardial effusion. Mitral Valve: The mitral valve is normal in structure. Mild mitral valve regurgitation. No evidence of mitral valve stenosis. MV peak gradient, 16.6 mmHg. The mean mitral valve gradient is 7.0 mmHg. Tricuspid Valve: The tricuspid valve is normal in structure. Tricuspid valve regurgitation is mild . No evidence of tricuspid stenosis. Aortic Valve: The aortic valve is normal in structure. Aortic valve regurgitation is not visualized. Aortic valve sclerosis is present, with no evidence of aortic valve stenosis. Aortic valve mean gradient measures 14.5 mmHg. Aortic valve peak gradient measures 25.2 mmHg. Aortic valve area, by VTI measures 2.91 cm. Pulmonic Valve: The pulmonic valve was normal in structure. Pulmonic valve regurgitation is not visualized. No evidence of pulmonic stenosis. Aorta: The aortic root is normal in size and structure. Venous: The inferior vena cava is normal in size with greater than 50% respiratory variability, suggesting right atrial pressure of 3 mmHg. IAS/Shunts:  No atrial level shunt detected by color flow Doppler.  LEFT VENTRICLE PLAX 2D LVIDd:         3.95 cm   Diastology LVIDs:         3.09 cm   LV e' medial:    4.57 cm/s LV PW:         1.05 cm   LV E/e' medial:  37.6 LV IVS:        1.01 cm   LV e' lateral:   6.53 cm/s LVOT diam:     2.10 cm   LV E/e' lateral: 26.3 LV SV:         147 LV SV Index:   79 LVOT Area:     3.46 cm  RIGHT VENTRICLE RV Basal diam:  3.81 cm RV S prime:     6.31 cm/s LEFT ATRIUM             Index        RIGHT ATRIUM           Index LA diam:         4.10 cm 2.20 cm/m   RA Area:     19.80 cm LA Vol (A2C):   71.1 ml 38.12 ml/m  RA Volume:   56.30 ml  30.19 ml/m LA Vol (A4C):   90.2 ml 48.36 ml/m LA Biplane Vol: 81.7 ml 43.80 ml/m  AORTIC VALVE                     PULMONIC VALVE AV Area (Vmax):    3.01 cm      PV Vmax:       0.88 m/s AV Area (Vmean):   2.70 cm      PV Vmean:      59.000 cm/s AV Area (VTI):     2.91 cm      PV VTI:        0.185 m AV Vmax:           251.00 cm/s   PV Peak grad:  3.1 mmHg AV Vmean:          175.500 cm/s  PV Mean grad:  2.0 mmHg AV VTI:            0.503 m AV Peak Grad:      25.2 mmHg AV Mean Grad:      14.5 mmHg LVOT Vmax:         218.00 cm/s LVOT Vmean:        137.000 cm/s LVOT VTI:          0.423 m LVOT/AV VTI ratio: 0.84  AORTA Ao Root diam: 2.50 cm MITRAL VALVE MV Area (PHT): 3.02 cm     SHUNTS MV Area VTI:   2.64 cm     Systemic VTI:  0.42 m MV Peak grad:  16.6 mmHg    Systemic Diam: 2.10 cm MV Mean grad:  7.0 mmHg MV Vmax:       2.04 m/s MV Vmean:      126.0 cm/s MV Decel Time: 251 msec MV E velocity: 172.00 cm/s MV A velocity: 159.00 cm/s MV E/A ratio:  1.08 Isaias Cowman MD Electronically signed by Isaias Cowman MD Signature Date/Time: 01/19/2021/8:39:50 AM    Final    CT CHEST ABDOMEN PELVIS WO CONTRAST  Result Date: 01/20/2021 CLINICAL DATA:  Sepsis. Mid back pain for approx 3 weeks that comes and goes. Denies falling or injury, denies hx of back pain, denies urinary changes.  EXAM: CT CHEST, ABDOMEN AND PELVIS WITHOUT CONTRAST TECHNIQUE: Multidetector CT imaging of the chest, abdomen and pelvis was performed following the standard protocol without IV contrast. COMPARISON:  CT abdomen pelvis 04/27/2012. FINDINGS: CT CHEST FINDINGS Cardiovascular: Prominent heart size. No significant pericardial effusion. The thoracic aorta is normal in caliber. Severe atherosclerotic plaque of the thoracic aorta. Aortic valve replacement. Four-vessel coronary artery calcifications status post coronary artery bypass  graft. The main pulmonary artery is normal in caliber. Mediastinum/Nodes: No gross hilar adenopathy, noting limited sensitivity for the detection of hilar adenopathy on this noncontrast study. No enlarged mediastinal or axillary lymph nodes. Thyroid gland, trachea, and esophagus demonstrate no significant findings. Lungs/Pleura: Mild emphysematous changes. Left lower lobe atelectasis versus scarring. No focal consolidation. There is a new 1.2 cm ground-glass airspace opacity within the right upper lobe (4:45, 5:84). Few other scattered regions of less well-defined ground-glass airspace opacity within the right lung. No pulmonary mass. No pleural effusion. No pneumothorax. Musculoskeletal: No chest wall abnormality. No suspicious lytic or blastic osseous lesions. No acute displaced fracture. Please see separately dictated CT thoracolumbar spine 01/13/2021. CT ABDOMEN PELVIS FINDINGS Hepatobiliary: No focal liver abnormality. Calcified gallstone noted within the gallbladder lumen. No gallbladder wall thickening or pericholecystic fluid. A calcified stone is noted in the distal common bile duct measuring up to 3 mm (5:70, 2:66). No biliary dilatation. Pancreas: No focal lesion. Normal pancreatic contour. No surrounding inflammatory changes. No main pancreatic ductal dilatation. Spleen: Normal in size without focal abnormality. Adrenals/Urinary Tract: No adrenal nodule bilaterally. Nonspecific bilateral perinephric stranding. Calcifications associated with the kidneys likely vascular. No nephrolithiasis and no hydronephrosis. No definite contour-deforming renal mass. No ureterolithiasis or hydroureter. The urinary bladder is unremarkable. Stomach/Bowel: Stomach is within normal limits. No evidence of bowel wall thickening or dilatation. Diffuse colonic diverticulosis. Appendix appears normal. Vascular/Lymphatic: No abdominal aorta or iliac aneurysm. Severe atherosclerotic plaque of the aorta and its branches. No  abdominal, pelvic, or inguinal lymphadenopathy. Reproductive: Prostate is unremarkable. Other: Trace free fluid and fat stranding within the left pelvis just anterior to the inguinal canal of unclear etiology (2:94, 5:67). Finding may be related to a small left inguinal hernia. No intraperitoneal free gas. No organized fluid collection. Musculoskeletal: No abdominal wall hernia or abnormality. No suspicious lytic or blastic osseous lesions. No acute displaced fracture. Please see separately dictated CT thoracolumbar spine 01/09/2021. IMPRESSION: 1. Interval development of a a 1.2 cm ground-glass airspace opacity within the right upper lobe as well as few other scattered regions of less well-defined ground-glass airspace opacity within the right lung. Findings represent infection/inflammation. Non-contrast chest CT at 3-6 months is recommended. If nodules persist, subsequent management will be based upon the most suspicious nodule(s). This recommendation follows the consensus statement: Guidelines for Management of Incidental Pulmonary Nodules Detected on CT Images: From the Fleischner Society 2017; Radiology 2017; 284:228-243. 2. Cholelithiasis as well as 3 mm distal common bile duct cholelithiasis. No associate CT findings of acute cholecystitis or biliary ductal dilatation. 3. Diffuse colonic diverticulosis with no acute diverticulitis. 4. Trace free fluid and fat stranding within the left pelvis just anterior to the inguinal canal of unclear etiology. Finding may be related to a small left inguinal hernia. Correlate with physical exam for inguinal hernia incarceration. Recommend attention on follow-up. 5. Aortic Atherosclerosis (ICD10-I70.0) and Emphysema (ICD10-J43.9). 6. Please note markedly limited evaluation on this noncontrast study. 7. Please see separately dictated CT thoracolumbar spine 01/16/2021. Electronically Signed   By: Iven Finn M.D.   On:  12/31/2020 22:36    Microbiology Recent Results  (from the past 240 hour(s))  SARS CORONAVIRUS 2 (TAT 6-24 HRS) Nasopharyngeal Nasopharyngeal Swab     Status: None   Collection Time: 01/20/21  8:00 PM   Specimen: Nasopharyngeal Swab  Result Value Ref Range Status   SARS Coronavirus 2 NEGATIVE NEGATIVE Final    Comment: (NOTE) SARS-CoV-2 target nucleic acids are NOT DETECTED.  The SARS-CoV-2 RNA is generally detectable in upper and lower respiratory specimens during the acute phase of infection. Negative results do not preclude SARS-CoV-2 infection, do not rule out co-infections with other pathogens, and should not be used as the sole basis for treatment or other patient management decisions. Negative results must be combined with clinical observations, patient history, and epidemiological information. The expected result is Negative.  Fact Sheet for Patients: SugarRoll.be  Fact Sheet for Healthcare Providers: https://www.woods-mathews.com/  This test is not yet approved or cleared by the [place] FDA and  has been authorized for detection and/or diagnosis of SARS-CoV-2 by FDA under an Emergency Use Authorization (EUA). This EUA will remain  in effect (meaning this test can be used) for the duration of the COVID-19 declaration under Se ction 564(b)(1) of the Act, 21 U.S.C. section 360bbb-3(b)(1), unless the authorization is terminated or revoked sooner.  Performed at [facility], [place] [address] [place], [place] [address]     Lab Basic Metabolic Panel: Recent Labs  Lab 01/22/21 0546 01/23/21 0603 01/24/21 0614 01/25/21 0615 01/26/21 0627  NA 140 140 142 142 140  K 3.9 4.3 4.5 4.3 4.6  CL 107 108 106 107 105  CO2 24 24 23 23 24   GLUCOSE 145* 159* 174* 189* 240*  BUN 62* 74* 85* 92* 94*  CREATININE 3.86* 4.53* 4.78* 4.56* 4.37*  CALCIUM 9.5 9.6 9.8 9.8 9.7  MG 2.3 2.5* 2.5* 2.7*  --   PHOS  --   --   --   --  4.4   Liver Function Tests: Recent Labs  Lab  01/26/21 0627  ALBUMIN 2.9*   CBC: Recent Labs  Lab 01/22/21 0546 01/23/21 0603 01/24/21 0614 01/25/21 0615  WBC 10.5 12.1* 12.0* 11.5*  HGB 8.0* 8.9* 8.1* 8.5*  HCT 25.5* 29.1* 26.4* 28.3*  MCV 83.9 85.3 85.2 85.2  PLT 139* 162 177 191    Sepsis Labs: Recent Labs  Lab 01/22/21 0546 01/23/21 0603 01/24/21 0614 01/25/21 0615  WBC 10.5 12.1* 12.0* 11.5*    Procedures/Operations    Cordelia Poche, MD 01/28/2021, 7:35 PM

## 2021-01-29 NOTE — Significant Event (Signed)
° ° °  OVERNIGHT PROGRESS REPORT  . Notified by RN that patient has expired at 0150.  Patient was DNR/comfort care  2 RN verified.  Family was notified by and available to nursing staff.      Neomia Glass MHA, MSN, FNP-BC Nurse Practitioner [facility]

## 2021-01-29 DEATH — deceased

## 2021-02-06 ENCOUNTER — Ambulatory Visit: Payer: Medicare Other | Admitting: Family

## 2021-02-27 ENCOUNTER — Encounter (INDEPENDENT_AMBULATORY_CARE_PROVIDER_SITE_OTHER): Payer: Medicare Other

## 2021-02-27 ENCOUNTER — Ambulatory Visit (INDEPENDENT_AMBULATORY_CARE_PROVIDER_SITE_OTHER): Payer: Medicare Other | Admitting: Vascular Surgery

## 2021-04-20 ENCOUNTER — Ambulatory Visit: Payer: Medicare Other | Admitting: Podiatry
